# Patient Record
Sex: Female | Born: 1950
Health system: Southern US, Community
[De-identification: ages and names within clinical notes are randomized; demographics above are authoritative.]

## PROBLEM LIST (undated history)

## (undated) DIAGNOSIS — D509 Iron deficiency anemia, unspecified: Secondary | ICD-10-CM

## (undated) DIAGNOSIS — J449 Chronic obstructive pulmonary disease, unspecified: Secondary | ICD-10-CM

## (undated) DIAGNOSIS — M5090 Cervical disc disorder, unspecified, unspecified cervical region: Secondary | ICD-10-CM

## (undated) DIAGNOSIS — I10 Essential (primary) hypertension: Secondary | ICD-10-CM

## (undated) DIAGNOSIS — A630 Anogenital (venereal) warts: Secondary | ICD-10-CM

## (undated) DIAGNOSIS — E785 Hyperlipidemia, unspecified: Secondary | ICD-10-CM

## (undated) DIAGNOSIS — I219 Acute myocardial infarction, unspecified: Secondary | ICD-10-CM

## (undated) DIAGNOSIS — E039 Hypothyroidism, unspecified: Secondary | ICD-10-CM

## (undated) DIAGNOSIS — I2510 Atherosclerotic heart disease of native coronary artery without angina pectoris: Secondary | ICD-10-CM

## (undated) DIAGNOSIS — I5020 Unspecified systolic (congestive) heart failure: Secondary | ICD-10-CM

## (undated) DIAGNOSIS — O9989 Other specified diseases and conditions complicating pregnancy, childbirth and the puerperium: Secondary | ICD-10-CM

## (undated) DIAGNOSIS — E042 Nontoxic multinodular goiter: Secondary | ICD-10-CM

## (undated) DIAGNOSIS — J309 Allergic rhinitis, unspecified: Secondary | ICD-10-CM

## (undated) DIAGNOSIS — O99891 Other specified diseases and conditions complicating pregnancy: Secondary | ICD-10-CM

## (undated) DIAGNOSIS — M1990 Unspecified osteoarthritis, unspecified site: Secondary | ICD-10-CM

## (undated) HISTORY — DX: Unspecified osteoarthritis, unspecified site: M19.90

## (undated) HISTORY — DX: Anogenital (venereal) warts: A63.0

## (undated) HISTORY — DX: Atherosclerotic heart disease of native coronary artery without angina pectoris: I25.10

## (undated) HISTORY — DX: Hyperlipidemia, unspecified: E78.5

## (undated) HISTORY — DX: Essential (primary) hypertension: I10

## (undated) HISTORY — DX: Nontoxic multinodular goiter: E04.2

## (undated) HISTORY — DX: Chronic obstructive pulmonary disease, unspecified: J44.9

## (undated) HISTORY — DX: Unspecified systolic (congestive) heart failure: I50.20

## (undated) HISTORY — DX: Other specified diseases and conditions complicating pregnancy, childbirth and the puerperium: O99.89

## (undated) HISTORY — DX: Allergic rhinitis, unspecified: J30.9

## (undated) HISTORY — DX: Other specified diseases and conditions complicating pregnancy: O99.891

## (undated) HISTORY — DX: Hypothyroidism, unspecified: E03.9

## (undated) HISTORY — DX: Cervical disc disorder, unspecified, unspecified cervical region: M50.90

## (undated) HISTORY — DX: Iron deficiency anemia, unspecified: D50.9

## (undated) HISTORY — PX: CHOLECYSTECTOMY: SHX55

## (undated) HISTORY — DX: Acute myocardial infarction, unspecified: I21.9

---

## 1993-03-12 HISTORY — PX: CARDIAC CATHETERIZATION: SHX172

## 1998-06-26 ENCOUNTER — Encounter: Payer: Self-pay | Admitting: Emergency Medicine

## 1998-06-26 ENCOUNTER — Emergency Department (HOSPITAL_COMMUNITY): Admission: EM | Admit: 1998-06-26 | Discharge: 1998-06-26 | Payer: Self-pay | Admitting: Emergency Medicine

## 1998-08-13 ENCOUNTER — Ambulatory Visit (HOSPITAL_COMMUNITY): Admission: RE | Admit: 1998-08-13 | Discharge: 1998-08-13 | Payer: Self-pay | Admitting: Chiropractic Medicine

## 1998-08-13 ENCOUNTER — Encounter: Payer: Self-pay | Admitting: Chiropractic Medicine

## 1998-08-19 ENCOUNTER — Encounter: Payer: Self-pay | Admitting: Chiropractic Medicine

## 1998-08-19 ENCOUNTER — Ambulatory Visit (HOSPITAL_COMMUNITY): Admission: RE | Admit: 1998-08-19 | Discharge: 1998-08-19 | Payer: Self-pay | Admitting: Chiropractic Medicine

## 2008-01-15 ENCOUNTER — Emergency Department (HOSPITAL_COMMUNITY): Admission: EM | Admit: 2008-01-15 | Discharge: 2008-01-15 | Payer: Self-pay | Admitting: Emergency Medicine

## 2008-02-01 ENCOUNTER — Emergency Department (HOSPITAL_COMMUNITY): Admission: EM | Admit: 2008-02-01 | Discharge: 2008-02-01 | Payer: Self-pay | Admitting: Emergency Medicine

## 2008-03-17 ENCOUNTER — Ambulatory Visit: Payer: Self-pay | Admitting: Internal Medicine

## 2008-03-17 DIAGNOSIS — D509 Iron deficiency anemia, unspecified: Secondary | ICD-10-CM

## 2008-03-17 DIAGNOSIS — R946 Abnormal results of thyroid function studies: Secondary | ICD-10-CM | POA: Insufficient documentation

## 2008-03-17 DIAGNOSIS — I10 Essential (primary) hypertension: Secondary | ICD-10-CM | POA: Insufficient documentation

## 2008-03-17 DIAGNOSIS — J309 Allergic rhinitis, unspecified: Secondary | ICD-10-CM

## 2008-03-17 HISTORY — DX: Allergic rhinitis, unspecified: J30.9

## 2008-03-18 ENCOUNTER — Telehealth (INDEPENDENT_AMBULATORY_CARE_PROVIDER_SITE_OTHER): Payer: Self-pay | Admitting: *Deleted

## 2008-03-18 LAB — CONVERTED CEMR LAB
ALT: 12 units/L (ref 0–35)
AST: 19 units/L (ref 0–37)
Basophils Absolute: 0 10*3/uL (ref 0.0–0.1)
Bilirubin Urine: NEGATIVE
Bilirubin, Direct: 0.1 mg/dL (ref 0.0–0.3)
CO2: 33 meq/L — ABNORMAL HIGH (ref 19–32)
Calcium: 9.7 mg/dL (ref 8.4–10.5)
Eosinophils Relative: 5.1 % — ABNORMAL HIGH (ref 0.0–5.0)
GFR calc non Af Amer: 54 mL/min
Glucose, Bld: 102 mg/dL — ABNORMAL HIGH (ref 70–99)
HDL: 57.6 mg/dL (ref >39.0)
Ketones, ur: NEGATIVE mg/dL
LDL Cholesterol: 122 mg/dL — ABNORMAL HIGH (ref 0–99)
Leukocytes, UA: NEGATIVE
Lymphocytes Relative: 32.2 % (ref 12.0–46.0)
Monocytes Absolute: 0.2 10*3/uL (ref 0.1–1.0)
Monocytes Relative: 6 % (ref 3.0–12.0)
Neutrophils Relative %: 56.7 % (ref 43.0–77.0)
Platelets: 220 10*3/uL (ref 150–400)
Potassium: 3.9 meq/L (ref 3.5–5.1)
RDW: 13.5 % (ref 11.5–14.6)
Total Bilirubin: 0.7 mg/dL (ref 0.3–1.2)
Total CHOL/HDL Ratio: 3.3
Total Protein: 7.6 g/dL (ref 6.0–8.3)
Triglycerides: 44 mg/dL (ref 0–149)
Urobilinogen, UA: 0.2 (ref 0.0–1.0)
Vit D, 1,25-Dihydroxy: 17 — ABNORMAL LOW (ref 30–89)

## 2008-03-26 ENCOUNTER — Ambulatory Visit: Payer: Self-pay | Admitting: Internal Medicine

## 2008-03-26 LAB — CONVERTED CEMR LAB: TSH: 0.4 microintl units/mL (ref 0.35–5.50)

## 2008-04-29 ENCOUNTER — Ambulatory Visit: Payer: Self-pay | Admitting: Endocrinology

## 2008-04-29 DIAGNOSIS — R519 Headache, unspecified: Secondary | ICD-10-CM | POA: Insufficient documentation

## 2008-04-29 DIAGNOSIS — R51 Headache: Secondary | ICD-10-CM

## 2008-04-29 DIAGNOSIS — R634 Abnormal weight loss: Secondary | ICD-10-CM

## 2009-02-14 ENCOUNTER — Emergency Department (HOSPITAL_COMMUNITY): Admission: EM | Admit: 2009-02-14 | Discharge: 2009-02-14 | Payer: Self-pay | Admitting: Emergency Medicine

## 2009-03-02 ENCOUNTER — Ambulatory Visit: Payer: Self-pay | Admitting: Internal Medicine

## 2009-03-02 ENCOUNTER — Encounter (INDEPENDENT_AMBULATORY_CARE_PROVIDER_SITE_OTHER): Payer: Self-pay | Admitting: *Deleted

## 2009-03-02 DIAGNOSIS — R1013 Epigastric pain: Secondary | ICD-10-CM

## 2009-03-02 DIAGNOSIS — R0602 Shortness of breath: Secondary | ICD-10-CM

## 2009-03-02 DIAGNOSIS — R1319 Other dysphagia: Secondary | ICD-10-CM

## 2009-03-02 DIAGNOSIS — R079 Chest pain, unspecified: Secondary | ICD-10-CM

## 2009-03-02 DIAGNOSIS — M79609 Pain in unspecified limb: Secondary | ICD-10-CM | POA: Insufficient documentation

## 2009-03-03 ENCOUNTER — Telehealth: Payer: Self-pay | Admitting: Internal Medicine

## 2009-03-08 ENCOUNTER — Encounter (INDEPENDENT_AMBULATORY_CARE_PROVIDER_SITE_OTHER): Payer: Self-pay | Admitting: *Deleted

## 2009-03-09 ENCOUNTER — Ambulatory Visit (HOSPITAL_COMMUNITY): Admission: RE | Admit: 2009-03-09 | Discharge: 2009-03-09 | Payer: Self-pay | Admitting: Internal Medicine

## 2009-04-01 ENCOUNTER — Ambulatory Visit: Payer: Self-pay | Admitting: Internal Medicine

## 2009-04-21 ENCOUNTER — Emergency Department (HOSPITAL_COMMUNITY): Admission: EM | Admit: 2009-04-21 | Discharge: 2009-04-21 | Payer: Self-pay | Admitting: Emergency Medicine

## 2009-04-21 ENCOUNTER — Ambulatory Visit: Payer: Self-pay | Admitting: Internal Medicine

## 2009-04-21 DIAGNOSIS — R062 Wheezing: Secondary | ICD-10-CM

## 2009-04-27 ENCOUNTER — Ambulatory Visit: Payer: Self-pay | Admitting: Internal Medicine

## 2009-04-29 ENCOUNTER — Ambulatory Visit: Payer: Self-pay | Admitting: Internal Medicine

## 2009-05-10 ENCOUNTER — Ambulatory Visit: Payer: Self-pay | Admitting: Internal Medicine

## 2009-05-12 ENCOUNTER — Telehealth: Payer: Self-pay | Admitting: Internal Medicine

## 2009-10-12 ENCOUNTER — Encounter: Payer: Self-pay | Admitting: Internal Medicine

## 2009-11-15 ENCOUNTER — Ambulatory Visit: Payer: Self-pay | Admitting: Internal Medicine

## 2009-11-16 LAB — CONVERTED CEMR LAB
ALT: 15 units/L (ref 0–35)
Albumin: 3.9 g/dL (ref 3.5–5.2)
Alkaline Phosphatase: 101 units/L (ref 39–117)
BUN: 20 mg/dL (ref 6–23)
Basophils Absolute: 0 10*3/uL (ref 0.0–0.1)
Basophils Relative: 0.8 % (ref 0.0–3.0)
Bilirubin, Direct: 0.1 mg/dL (ref 0.0–0.3)
Calcium: 9.2 mg/dL (ref 8.4–10.5)
Creatinine, Ser: 1 mg/dL (ref 0.4–1.2)
Eosinophils Absolute: 0.1 10*3/uL (ref 0.0–0.7)
GFR calc non Af Amer: 76.89 mL/min (ref >60)
Glucose, Bld: 92 mg/dL (ref 70–99)
HCT: 37 % (ref 36.0–46.0)
HDL: 50.7 mg/dL (ref >39.00)
Hemoglobin: 12.1 g/dL (ref 12.0–15.0)
Ketones, ur: NEGATIVE mg/dL
Leukocytes, UA: NEGATIVE
Lymphocytes Relative: 40.3 % (ref 12.0–46.0)
MCHC: 32.8 g/dL (ref 30.0–36.0)
MCV: 88.6 fL (ref 78.0–100.0)
Nitrite: NEGATIVE
RBC: 4.18 M/uL (ref 3.87–5.11)
Sodium: 142 meq/L (ref 135–145)
Specific Gravity, Urine: 1.025 (ref 1.000–1.030)
TSH: 0.28 microintl units/mL — ABNORMAL LOW (ref 0.35–5.50)
Total CHOL/HDL Ratio: 3
Total Protein, Urine: NEGATIVE mg/dL
Total Protein: 7 g/dL (ref 6.0–8.3)
Triglycerides: 72 mg/dL (ref 0.0–149.0)
pH: 6.5 (ref 5.0–8.0)

## 2009-11-25 ENCOUNTER — Ambulatory Visit: Payer: Self-pay | Admitting: Endocrinology

## 2009-12-12 ENCOUNTER — Encounter: Admission: RE | Admit: 2009-12-12 | Discharge: 2009-12-12 | Payer: Self-pay | Admitting: Endocrinology

## 2009-12-12 ENCOUNTER — Encounter (HOSPITAL_COMMUNITY)
Admission: RE | Admit: 2009-12-12 | Discharge: 2010-03-12 | Payer: Self-pay | Source: Home / Self Care | Attending: Endocrinology | Admitting: Endocrinology

## 2009-12-23 ENCOUNTER — Ambulatory Visit: Payer: Self-pay | Admitting: Endocrinology

## 2009-12-23 DIAGNOSIS — E042 Nontoxic multinodular goiter: Secondary | ICD-10-CM

## 2009-12-23 HISTORY — DX: Nontoxic multinodular goiter: E04.2

## 2010-01-05 ENCOUNTER — Ambulatory Visit (HOSPITAL_COMMUNITY): Admission: RE | Admit: 2010-01-05 | Discharge: 2010-01-05 | Payer: Self-pay | Admitting: Endocrinology

## 2010-03-14 ENCOUNTER — Encounter: Payer: Self-pay | Admitting: Internal Medicine

## 2010-04-09 LAB — CONVERTED CEMR LAB
AST: 15 units/L (ref 0–37)
Alkaline Phosphatase: 86 units/L (ref 39–117)
BUN: 6 mg/dL (ref 6–23)
Bilirubin, Direct: 0 mg/dL (ref 0.0–0.3)
CO2: 30 meq/L (ref 19–32)
Calcium: 9.8 mg/dL (ref 8.4–10.5)
Chloride: 105 meq/L (ref 96–112)
Creatinine, Ser: 1.3 mg/dL — ABNORMAL HIGH (ref 0.4–1.2)
Eosinophils Absolute: 0.1 10*3/uL (ref 0.0–0.7)
HCT: 38.9 % (ref 36.0–46.0)
HDL: 44.2 mg/dL (ref >39.00)
Hemoglobin: 12.7 g/dL (ref 12.0–15.0)
Ketones, ur: NEGATIVE mg/dL
Lymphocytes Relative: 38.6 % (ref 12.0–46.0)
Monocytes Relative: 5.8 % (ref 3.0–12.0)
Platelets: 171 10*3/uL (ref 150.0–400.0)
Potassium: 4.2 meq/L (ref 3.5–5.1)
RDW: 13.3 % (ref 11.5–14.6)
Sed Rate: 29 mm/hr — ABNORMAL HIGH (ref 0–22)
Specific Gravity, Urine: 1.02 (ref 1.000–1.030)
TSH: 0.25 microintl units/mL — ABNORMAL LOW (ref 0.35–5.50)
Total Bilirubin: 0.7 mg/dL (ref 0.3–1.2)
Total CHOL/HDL Ratio: 4
Total Protein: 7.1 g/dL (ref 6.0–8.3)
Transferrin: 201.2 mg/dL — ABNORMAL LOW (ref 212.0–360.0)
Urine Glucose: NEGATIVE mg/dL
Urobilinogen, UA: 0.2 (ref 0.0–1.0)
Vitamin B-12: 430 pg/mL (ref 211–911)
pH: 7 (ref 5.0–8.0)

## 2010-04-13 NOTE — Letter (Signed)
Summary: Generic Letter  [facility]  [address] [place], [place] [address]   Phone: [phone]  Fax: [phone]    05/10/2009  Ivoree HOKE [address] [place], [place]  [address]  Dear Ms. Tocco,       This note is to certify that Ms Coulibaly is released from  acute medical care, and is able to return to work May 11, 2009  without restriction.      Sincerely,   Oliver Barre MD

## 2010-04-13 NOTE — Letter (Signed)
Summary: Primary Care Appointment Letter  [facility]  [address] [place], [place] [address]   Phone: [phone]  Fax: [phone]    10/12/2009 MRN: [id number]  [name] [address] [place], [place]  [address]  Dear Ms. Darcus Austin,   Your Primary Care Physician Corwin Levins MD has indicated that:    _______it is time to schedule an appointment.    _______you missed your appointment on______ and need to call and          reschedule.    _______you need to have lab work done.    _______you need to schedule an appointment discuss lab or test results.    ___X____you need to call to reschedule your appointment that is                       scheduled on November 11, 2009 with Dr. Jonny Ruiz.  Please call the office.     Please call our office as soon as possible. Our phone number is [phone]. Please press option 1. Our office is open 8a-12noon and 1p-5p, Monday through Friday.     Thank you,    [facility]

## 2010-04-13 NOTE — Assessment & Plan Note (Signed)
Summary: 6 MO ROV /NWS  #   Vital Signs:  Patient profile:   60 year old female Height:      65 inches Weight:      124 pounds BMI:     20.71 O2 Sat:      98 % on Room air Temp:     97.5 degrees F oral Pulse rate:   64 / minute BP sitting:   132 / 90  (left arm) Cuff size:   regular  Vitals Entered By: Zella Ball Ewing CMA (AAMA) (November 15, 2009 4:14 PM)  O2 Flow:  Room air  CC: 6 month ROV/RE   CC:  6 month ROV/RE.  History of Present Illness: here for f/u - wellness;  has some mild nasal allergy symptoms without pain, cough or fever;  Pt denies CP, worsening sob, doe, wheezing, orthopnea, pnd, worsening LE edema, palps, dizziness or syncope  Pt denies new neuro symptoms such as headache, facial or extremity weakness  denies polydipsia or polyuria.  Needs med refills.    Preventive Screening-Counseling & Management      Drug Use:  no.    Problems Prior to Update: 1)  Wheezing  (ICD-786.07) 2)  Hand Pain, Right  (ICD-729.5) 3)  Other Dysphagia  (ICD-787.29) 4)  Abdominal Pain, Epigastric  (ICD-789.06) 5)  Chest Pain  (ICD-786.50) 6)  Dyspnea  (ICD-786.05) 7)  Headache  (ICD-784.0) 8)  Weight Loss  (ICD-783.21) 9)  Thyroid Function Test, Abnormal  (ICD-794.5) 10)  Preventive Health Care  (ICD-V70.0) 11)  Neoplasm, Malignant, Colon, Family Hx, Sibling  (ICD-V16.0) 12)  Anemia-iron Deficiency  (ICD-280.9) 13)  Allergic Rhinitis  (ICD-477.9) 14)  Hypertension  (ICD-401.9)  Medications Prior to Update: 1)  Adult Aspirin Ec Low Strength 81 Mg Tbec (Aspirin) .Marland Kitchen.. 1po Once Daily 2)  Lisinopril 20 Mg Tabs (Lisinopril) .Marland Kitchen.. 1po Once Daily 3)  Vitamin D3 1000 Unit Tabs (Cholecalciferol) .... Take 1 By Mouth Qd  Current Medications (verified): 1)  Adult Aspirin Ec Low Strength 81 Mg Tbec (Aspirin) .Marland Kitchen.. 1po Once Daily 2)  Lisinopril 20 Mg Tabs (Lisinopril) .Marland Kitchen.. 1po Once Daily 3)  Vitamin D3 1000 Unit Tabs (Cholecalciferol) .... Take 1 By Mouth Qd  Allergies (verified): 1)   ! Pcn 2)  ! * Strawberries  Past History:  Past Medical History: Last updated: 03/17/2008 Hypertension Allergic rhinitis hx of genital warts Anemia-iron deficiency hx of blood transfusion after first child at 20yo DJD - hands c-spine disc disease with recurrent radicular pain - dr Montez Morita  Past Surgical History: Last updated: 03/17/2008 Cholecystectomy hx of heart cath 1995 with "tiny blockage" - [place] - dr Daisy Floro  Family History: Last updated: 04/29/2008 sister with colon cancer, pacemaker, HTN, DM, depression ETOH, drug abuse in several family parents with stroke, HTN, heart disease, DM grandfather with TIA mother with shingles sister has uncertain type of thyroid dz  Social History: Last updated: 11/15/2009 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         all otherwise negative per pt -    Physical Exam  General:  alert and underweight appearing.   Head:  normocephalic and atraumatic.   Eyes:  vision grossly intact, pupils equal, and pupils round.   Ears:  R ear normal and L ear normal.   Nose:   nasal dischargemucosal pallor and mucosal edema.   Mouth:  no gingival abnormalities and pharynx pink and moist.   Neck:  supple and no masses.   Lungs:  normal respiratory effort and normal breath sounds.   Heart:  normal rate and regular rhythm.   Abdomen:  soft, non-tender, and normal bowel sounds.   Msk:  no joint tenderness and no joint swelling.   Extremities:  no edema, no erythema  Neurologic:  cranial nerves II-XII intact and strength normal in all extremities.   Skin:  color normal and no rashes.   Psych:  not depressed appearing and slightly anxious.     Impression & Recommendations:  Problem # 1:  Preventive Health Care (ICD-V70.0)  Overall doing well, age appropriate education and counseling updated and referral for appropriate preventive services done unless declined, immunizations up to date or declined, diet counseling done if overweight, urged to quit smoking if smokes , most recent labs reviewed and current ordered if appropriate, ecg reviewed or declined (interpretation per ECG scanned in the EMR if done); information regarding Medicare Prevention requirements given if appropriate; speciality referrals updated as appropriate   Orders: Gastroenterology Referral (GI) TLB-BMP (Basic Metabolic Panel-BMET) (80048-METABOL) TLB-CBC Platelet - w/Differential (85025-CBCD) TLB-Hepatic/Liver Function Pnl (80076-HEPATIC) TLB-Lipid Panel (80061-LIPID) TLB-TSH (Thyroid Stimulating Hormone) (84443-TSH) TLB-Udip ONLY (81003-UDIP)  Problem # 2:  ALLERGIC RHINITIS (ICD-477.9) for OTC allegra as needed   Problem # 3:  HYPERTENSION (ICD-401.9)  Her updated medication list for this problem includes:    Lisinopril 20 Mg Tabs (Lisinopril) .Marland Kitchen... 1po once daily  BP today: 132/90 Prior BP: 140/80 (05/10/2009)  Labs Reviewed: K+: 4.2 (03/02/2009) Creat: : 1.3 (03/02/2009)   Chol: 157 (03/02/2009)   HDL: 44.20 (03/02/2009)   LDL: 95 (03/02/2009)   TG: 91.0 (03/02/2009) stable overall  by hx and exam, ok to continue meds/tx as is   Complete Medication List: 1)  Adult Aspirin Ec Low Strength 81 Mg Tbec (Aspirin) .Marland Kitchen.. 1po once daily 2)  Lisinopril 20 Mg Tabs (Lisinopril) .Marland Kitchen.. 1po once daily 3)  Vitamin D3 1000 Unit Tabs (Cholecalciferol) .... Take 1 by mouth qd  Other Orders: Tdap => 85yrs IM ([address]) Admin 1st Vaccine ([address])  Patient Instructions: 1)  You can try the OTC allegra as needed  2)  Please go to the Lab in the basement for your blood and/or urine tests today 3)  Please call the number on the [facility] Card for results of your testing  4)  You will be contacted about the referral(s) to: Colonoscopy 5)  you had the tetanus shot today 6)  Continue all previous medications as before this visit  7)  Please schedule a follow-up appointment in 1 year, or sooner if needed Prescriptions: LISINOPRIL 20 MG TABS (LISINOPRIL) 1po once daily  #90 x 3   Entered and Authorized by:   Corwin Levins MD   Signed by:   Corwin Levins MD on 11/15/2009   Method used:  Print then Give to Patient   RxID:   [id number]    Immunizations Administered:  Tetanus Vaccine:    Vaccine Type: Tdap    Site: left deltoid    Mfr: GlaxoSmithKline    Dose: 0.5 ml    Route: IM    Given by: Zella Ball Ewing CMA (AAMA)    Exp. Date: 12/30/2011    Lot #: [id number]    VIS given: 01/28/08 version given November 15, 2009.

## 2010-04-13 NOTE — Assessment & Plan Note (Signed)
Summary: FOLLOW UP FOR THYROID-LB   Vital Signs:  Patient profile:   60 year old female Height:      65 inches (165.10 cm) Weight:      125.50 pounds (57.05 kg) BMI:     20.96 O2 Sat:      97 % on Room air Temp:     98.6 degrees F (37.00 degrees C) oral Pulse rate:   77 / minute BP sitting:   136 / 76  (left arm) Cuff size:   regular  Vitals Entered By: Brenton Grills MA (December 23, 2009 4:26 PM)  O2 Flow:  Room air CC: Follow up on thyroid/aj Is Patient Diabetic? No   CC:  Follow up on thyroid/aj.  History of Present Illness: pt has h/o hyperthyroidism.  pt states she feels well in general, and neck pain is improved.  Current Medications (verified): 1)  Adult Aspirin Ec Low Strength 81 Mg Tbec (Aspirin) .Marland Kitchen.. 1po Once Daily 2)  Lisinopril 20 Mg Tabs (Lisinopril) .Marland Kitchen.. 1po Once Daily 3)  Vitamin D3 1000 Unit Tabs (Cholecalciferol) .... Take 1 By Mouth Qd 4)  Azithromycin 500 Mg Tabs (Azithromycin) .Marland Kitchen.. 1 Tab Once Daily 5)  Allegra Allergy 180 Mg Tabs (Fexofenadine Hcl) .Marland Kitchen.. 1 By Mouth Once Daily  Allergies (verified): 1)  ! Pcn 2)  ! * Strawberries  Past History:  Past Medical History: Last updated: 03/17/2008 Hypertension Allergic rhinitis hx of genital warts Anemia-iron deficiency hx of blood transfusion after first child at 20yo DJD - hands c-spine disc disease with recurrent radicular pain - dr Montez Morita  Family History: Reviewed history from 04/29/2008 and no changes required. sister with colon cancer, pacemaker, HTN, DM, depression ETOH, drug abuse in several family parents with stroke, HTN, heart disease, DM grandfather with TIA mother with shingles sister has uncertain type of thyroid dz  Review of Systems  The patient denies dyspnea on exertion.    Physical Exam  General:  normal appearance.   Neck:  i can note a 1-cm thyroid left nodule.   Additional Exam:  i reviewed results of ultrasound and nuc med scan with pt, and i gave her a copy of  each.     Impression & Recommendations:  Problem # 1:  GOITER, MULTINODULAR (ICD-241.1) Assessment Unchanged  Problem # 2:  THYROID FUNCTION TEST, ABNORMAL (ICD-794.5) due to #1  Medications Added to Medication List This Visit: 1)  Allegra Allergy 180 Mg Tabs (Fexofenadine hcl) .Marland Kitchen.. 1 by mouth once daily  Other Orders: Radiology Referral (Radiology) Est. Patient Level III ([address])  Patient Instructions: 1)  you will be given a radioactive iodine pill to shrink or destroy the thyroid lump, at the x ray department.  you will be called with a day and time for an appointment. 2)  please plan to return here approx 6 weeks after that.   Orders Added: 1)  Radiology Referral [Radiology] 2)  Est. Patient Level III [[address]]

## 2010-04-13 NOTE — Assessment & Plan Note (Signed)
Summary: FU---STC   Vital Signs:  Patient profile:   60 year old female Height:      65 inches Weight:      108.50 pounds BMI:     18.12 O2 Sat:      98 % on Room air Temp:     97.9 degrees F oral Pulse rate:   66 / minute BP sitting:   140 / 80  (left arm) Cuff size:   regular  Vitals Entered ByZella Ball Ewing (May 10, 2009 1:22 PM)  O2 Flow:  Room air CC: followup/RE   CC:  followup/RE.  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 - [employer]  Problems Prior to Update: 1)  Wheezing  (ICD-786.07) 2)  Hand Pain, Right  (ICD-729.5) 3)  Other Dysphagia  (ICD-787.29) 4)  Abdominal Pain, Epigastric  (ICD-789.06) 5)  Chest Pain  (ICD-786.50) 6)  Dyspnea  (ICD-786.05) 7)  Headache  (ICD-784.0) 8)  Weight Loss  (ICD-783.21) 9)  Thyroid Function Test, Abnormal  (ICD-794.5) 10)  Preventive Health Care  (ICD-V70.0) 11)  Neoplasm, Malignant, Colon, Family Hx, Sibling  (ICD-V16.0) 12)  Anemia-iron Deficiency  (ICD-280.9) 13)  Allergic Rhinitis  (ICD-477.9) 14)  Hypertension  (ICD-401.9)  Medications Prior to Update: 1)  Adult Aspirin Ec Low Strength 81 Mg Tbec (Aspirin) .Marland Kitchen.. 1po Once Daily 2)  Lisinopril 10 Mg Tabs (Lisinopril) .Marland Kitchen.. 1po Once Daily 3)  Vitamin D3 1000 Unit Tabs (Cholecalciferol) .... Take 1 By Mouth Qd  Current Medications (verified): 1)  Adult Aspirin Ec Low Strength 81 Mg Tbec (Aspirin) .Marland Kitchen.. 1po Once Daily 2)  Lisinopril 20 Mg Tabs (Lisinopril) .Marland Kitchen.. 1po Once Daily 3)  Vitamin D3 1000 Unit Tabs (Cholecalciferol) .... Take 1 By Mouth Qd  Allergies (verified): 1)  ! Pcn 2)  ! 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   Head:  normocephalic and atraumatic.   Eyes:  vision grossly intact, pupils equal, and pupils round.   Ears:  R ear normal and L ear normal.   Nose:  no external deformity and no nasal discharge.   Mouth:  no gingival abnormalities and pharynx pink and moist.   Neck:  supple and no masses.   Lungs:  normal respiratory effort and normal breath sounds.   Heart:  normal rate and regular rhythm.   Extremities:  no edema, no erythema  Impression & Recommendations:  Problem # 1:  WHEEZING (ICD-786.07) assoc with bronchitis recent - resolved, ok for note to back to work  Problem # 2:  HYPERTENSION (ICD-401.9)  Her updated medication list for this problem includes:    Lisinopril 20 Mg Tabs (Lisinopril) .Marland Kitchen... 1po once daily stable overall by hx and exam, ok to continue meds/tx as is   Complete Medication List: 1)  Adult Aspirin Ec Low Strength 81 Mg Tbec (Aspirin) .Marland Kitchen.. 1po once daily 2)  Lisinopril 20 Mg Tabs (Lisinopril) .Marland Kitchen.. 1po once daily 3)  Vitamin D3 1000 Unit Tabs (Cholecalciferol) .... Take 1 by mouth qd  Patient Instructions: 1)  Continue all previous medications as before this visit  2)  you are given the work  release note today 3)  Please schedule a follow-up appointment in 6 months.

## 2010-04-13 NOTE — Progress Notes (Signed)
Summary: FMLA  Phone Note Other Incoming   Summary of Call: Called to discuss FMLA papers, left message on voicemail to call back to office. Initial call taken by: Lucious Groves,  May 12, 2009 3:11 PM  Follow-up for Phone Call        Left message on voicemail to call back to office. Follow-up by: Lucious Groves,  May 17, 2009 11:36 AM  Additional Follow-up for Phone Call Additional follow up Details #1::        no return call from patient, left message on voicemail to call back to office. Additional Follow-up by: Lucious Groves,  May 19, 2009 2:52 PM    Additional Follow-up for Phone Call Additional follow up Details #2::    no return call from patient, closed note until patient calls. Follow-up by: Lucious Groves,  May 23, 2009 4:05 PM

## 2010-04-13 NOTE — Letter (Signed)
Summary: Referral - not able to see patient  [facility]  [address] [place], [place] [address]   Phone: [phone]  Fax: [phone]    March 14, 2010   Oliver Barre, MD [address] [place], [place] [address]   Re:   [name] DOB:  [birth year] MRN:   [id number]    Dear Dr. Jonny Ruiz:  Thank you for your kind referral of the above patient.  We have attempted to schedule the recommended procedure Screening Colonoscopy but have not been able to schedule because:  X   The patient was not available by phone and/or has not returned our calls.  ___ The patient declined to schedule the procedure at this time.  We appreciate the referral and hope that we will have the opportunity to treat this patient in the future.    Sincerely,    [facility] [phone]

## 2010-04-13 NOTE — Assessment & Plan Note (Signed)
Summary: NEW ENDO CONSULT/ THYROID/ NWS   Vital Signs:  Patient profile:   60 year old female Height:      65 inches (165.10 cm) Weight:      124.38 pounds (56.54 kg) BMI:     20.77 O2 Sat:      97 % on Room air Temp:     98.5 degrees F (36.94 degrees C) oral Pulse rate:   76 / minute BP sitting:   130 / 82  (left arm) Cuff size:   regular  Vitals Entered By: Brenton Grills MA (November 25, 2009 3:39 PM)  O2 Flow:  Room air CC: New Endo/Thyroid/Dr. John/aj   CC:  New Endo/Thyroid/Dr. John/aj.  History of Present Illness: pt has been intermittently noted to have a suppressed tsh since 2009.  i saw her in early 2010.  she declined ultrasound then.  pt states she feels well in general, except for few days of a slight pain at the throat (right > left), and assoc swelling of the neck.     Current Medications (verified): 1)  Adult Aspirin Ec Low Strength 81 Mg Tbec (Aspirin) .Marland Kitchen.. 1po Once Daily 2)  Lisinopril 20 Mg Tabs (Lisinopril) .Marland Kitchen.. 1po Once Daily 3)  Vitamin D3 1000 Unit Tabs (Cholecalciferol) .... Take 1 By Mouth Qd  Allergies (verified): 1)  ! Pcn 2)  ! * Strawberries  Past History:  Past Medical History: Last updated: 03/17/2008 Hypertension Allergic rhinitis hx of genital warts Anemia-iron deficiency hx of blood transfusion after first child at 20yo DJD - hands c-spine disc disease with recurrent radicular pain - dr Montez Morita  Social History: Reviewed history from 11/15/2009 and no changes required. 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  The patient denies fever and dyspnea on exertion.            Physical Exam  General:  normal appearance.   Head:  head: no deformity eyes: no periorbital swelling, no proptosis external nose and ears are normal mouth: no  lesion seen Neck:  thyroid has slightly irreg texture, and ? of 1-cm left nodule.   Additional Exam:  FastTSH              [L]  0.28 uIU/mL     i also reviewed cxr reports.  none make incidental note of a goiter.   Impression & Recommendations:  Problem # 1:  THYROID FUNCTION TEST, ABNORMAL (ICD-794.5) usually due to multinodular goiter  Problem # 2:  uri new  Medications Added to Medication List This Visit: 1)  Azithromycin 500 Mg Tabs (Azithromycin) .Marland Kitchen.. 1 tab once daily  Other Orders: Radiology Referral (Radiology) Radiology Referral (Radiology) Consultation Level IV [phone]) Consultation Level III [phone])  Patient Instructions: 1)  azithromycin 500 mg once daily 2)  let's check a thyroid ultrasound, and "scan" ( a special but easy type of thyroid x ray).. you will be called with a day and time for an appointment, for each of these 2 tests. 3)  when you know when these tests are scheduled for, please plan to return here 1-2 days after the 2nd test.   Prescriptions: AZITHROMYCIN 500 MG TABS (AZITHROMYCIN) 1 tab once daily  #6 x 0   Entered and Authorized by:   Minus Breeding MD  Signed by:   Minus Breeding MD on 11/25/2009   Method used:   Electronically to        [facility] [phone]* (retail)       [address]       [place], [place]  [address]       Ph: [phone]       Fax: [phone]   RxID:   [id number]

## 2010-04-13 NOTE — Assessment & Plan Note (Signed)
Summary: BREATHING PROBEMS/ WILL GO TO ER IF WORSE/ REFUSED APPT TODAY...   Vital Signs:  Patient profile:   60 year old female Height:      66 inches Weight:      116.25 pounds BMI:     18.83 O2 Sat:      98 % on Room air Temp:     98.2 degrees F oral Pulse rate:   68 / minute BP sitting:   112 / 76  (left arm) Cuff size:   large  O2 Flow:  Room air CC: Pt c/o SOB, right shoulder/arm pain, headache, niight sweats and sore throat/re   CC:  Pt c/o SOB, right shoulder/arm pain, headache, and niight sweats and sore throat/re.  History of Present Illness: here for eval for dyspnea/sob and cp, trying to avoid the ER;  states she has had 8 days of  gradually worsening symptoms, seemed to start with URI symtpoms (and still "I cant breathe out of my left nose" due to congestion), increased ST, neck pain with painful/tender lumps mostly on the right side; and gradually increased sob/doe with mild prod cough (not sure of color of sputum it seems), wheezing, chest tightness to the point that she has to stop to catch her breath just stanidng up from the chair;  can only walk to room to room, breathless to give the hx;  also has ongoing fairly constant SSCP that does seem pleuritic, but worse in the last 24 hrs, and ECG today with sinus with worsening LVH strain pattern, not likely st depression and no t wave inversions.  Given alb neb and depomedrol 120 IM x 1 in the office, now referred to hosp for admit for likely acute bronchitis, copd exacerbation, and CP (should likely be ruled out for MI, tele).    Problems Prior to Update: 1)  Hand Pain, Right  (ICD-729.5) 2)  Other Dysphagia  (ICD-787.29) 3)  Abdominal Pain, Epigastric  (ICD-789.06) 4)  Chest Pain  (ICD-786.50) 5)  Dyspnea  (ICD-786.05) 6)  Headache  (ICD-784.0) 7)  Weight Loss  (ICD-783.21) 8)  Thyroid Function Test, Abnormal  (ICD-794.5) 9)  Preventive Health Care  (ICD-V70.0) 10)  Neoplasm, Malignant, Colon, Family Hx, Sibling   (ICD-V16.0) 11)  Anemia-iron Deficiency  (ICD-280.9) 12)  Allergic Rhinitis  (ICD-477.9) 13)  Hypertension  (ICD-401.9)  Medications Prior to Update: 1)  Adult Aspirin Ec Low Strength 81 Mg Tbec (Aspirin) .Marland Kitchen.. 1po Once Daily 2)  Lisinopril 10 Mg Tabs (Lisinopril) .Marland Kitchen.. 1po Once Daily 3)  Vitamin D3 1000 Unit Tabs (Cholecalciferol) .... Take 1 By Mouth Qd 4)  Levaquin 500 Mg Tabs (Levofloxacin) .Marland Kitchen.. 1po Once Daily  Current Medications (verified): 1)  Adult Aspirin Ec Low Strength 81 Mg Tbec (Aspirin) .Marland Kitchen.. 1po Once Daily 2)  Lisinopril 10 Mg Tabs (Lisinopril) .Marland Kitchen.. 1po Once Daily 3)  Vitamin D3 1000 Unit Tabs (Cholecalciferol) .... Take 1 By Mouth Qd 4)  Levaquin 500 Mg Tabs (Levofloxacin) .Marland Kitchen.. 1po Once Daily  Allergies (verified): 1)  ! Pcn 2)  ! * Strawberries  Past History:  Past Medical History: Last updated: 03/17/2008 Hypertension Allergic rhinitis hx of genital warts Anemia-iron deficiency hx of blood transfusion after first child at 20yo DJD - hands c-spine disc disease with recurrent radicular pain - dr Montez Morita  Past Surgical History: Last updated: 03/17/2008 Cholecystectomy hx of heart cath 1995 with "tiny blockage" - [place] - dr Daisy Floro  Family History: Last updated: 04/29/2008 sister with colon cancer, pacemaker, HTN, DM, depression  ETOH, drug abuse in several family parents with stroke, HTN, heart disease, DM grandfather with TIA mother with shingles sister has uncertain type of thyroid dz  Social History: Last updated: 03/02/2009 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   Head:   normocephalic and atraumatic.   Eyes:  vision grossly intact, pupils equal, and pupils round.   Ears:  tm's mild erythema bilat, sinus nontender Nose:  nasal dischargemucosal pallor and mucosal erythema.   Mouth:  pharyngeal erythema and poor dentition.   Neck:  supple.  but with tender bilat LA, worse and more tender on the right submandibular and pre-scm areas Lungs:  increased RR/WOB, talks in short sentences, no retractions; has decreased BS bilat with few wheeze bilat, no rales Heart:  normal rate and regular rhythm.   Abdomen:  soft, non-tender, and normal bowel sounds.   Msk:  no joint tenderness and no joint swelling.   Extremities:  no edema, no erythema  Neurologic:  alert & oriented X3 and cranial nerves II-XII intact.     Impression & Recommendations:  Problem # 1:  BRONCHITIS-ACUTE (ICD-466.0)  The following medications were removed from the medication list:    Levaquin 500 Mg Tabs (Levofloxacin) .Marland Kitchen... 1po once daily mild to mod, but will need antibx tx; see below  Problem # 2:  WHEEZING (ICD-786.07) marked, likely due to bronchospasm due to above - gave neb and steroid shot IM today, will need admit for further eval and tx, O2 and nebs  Problem # 3:  CHEST PAIN (ICD-786.50) ECG today reviewed with worsening LVH /straiin pattern, and CP atypical, should be on tele and r/o for MI  Problem # 4:  HYPERTENSION (ICD-401.9)  Her updated medication list for this problem includes:    Lisinopril 10 Mg Tabs (Lisinopril) .Marland Kitchen... 1po once daily  BP today: 112/76 Prior BP: 140/86 (03/02/2009)  Labs Reviewed: K+: 4.2 (03/02/2009) Creat: : 1.3 (03/02/2009)   Chol: 157 (03/02/2009)   HDL: 44.20 (03/02/2009)   LDL: 95 (03/02/2009)   TG: 91.0 (03/02/2009) stable overall by hx and exam, ok to continue meds/tx as is   Complete Medication List: 1)  Adult Aspirin Ec Low Strength 81 Mg Tbec (Aspirin) .Marland Kitchen.. 1po once daily 2)  Lisinopril 10 Mg Tabs (Lisinopril) .Marland Kitchen.. 1po once daily 3)   Vitamin D3 1000 Unit Tabs (Cholecalciferol) .... Take 1 by mouth qd  Patient Instructions: 1)  you were given the nebulizer treatment and ad steroid shot today in the office. 2)  Please take this note with you for admission  Appended Document: Immunization Entry      Medication Administration  Injection # 1:    Medication: Depo- Medrol 40mg     Diagnosis: WHEEZING (ICD-786.07)    Route: IM    Site: RUOQ gluteus    Exp Date: 12/2009    Lot #: [id number]    Mfr: Teva    Given by: Zella Ball Ewing (April 21, 2009 11:58 AM)  Injection # 2:    Medication: Depo- Medrol 80mg     Diagnosis: WHEEZING (ICD-786.07)    Route: IM    Site: RUOQ gluteus  Exp Date: 12/2009    Lot #: [id number]    Mfr: TEva    Given by: Zella Ball Ewing (April 21, 2009 11:58 AM)  Orders Added: 1)  Depo- Medrol 40mg  [J1030] 2)  Depo- Medrol 80mg  [J1040] 3)  Admin of Therapeutic Inj  intramuscular or subcutaneous [96372]  Appended Document: BREATHING PROBEMS/ WILL GO TO ER IF WORSE/ REFUSED APPT TODAY...    Clinical Lists Changes  Orders: Added new Service order of Nebulizer Tx (78295) - Signed Added new Service order of EKG w/ Interpretation (93000) - Signed

## 2010-04-13 NOTE — Progress Notes (Signed)
°  Appt 04/13/08 @ 8:15 am w/Dr Everardo All   ---- Converted from flag ---- ---- 03/18/2008 9:22 AM, Dagoberto Reef wrote: Please schedule with Dr Everardo All,  Thanks  ---- 03/17/2008 5:57 PM, Corwin Levins MD wrote: The following orders have been entered for this patient and placed on Admin Hold:  Type:     Referral       Code:   Endocrine Description:   Endocrinology Referral Order Date:   03/17/2008   Authorized By:   Corwin Levins MD Order #:   [id number] Clinical Notes:   dr Everardo All ------------------------------

## 2010-04-13 NOTE — Miscellaneous (Signed)
Summary: Orders Update   Clinical Lists Changes  Orders: Added new Referral order of Endocrinology Referral (Endocrine) - Signed 

## 2010-04-24 ENCOUNTER — Ambulatory Visit (INDEPENDENT_AMBULATORY_CARE_PROVIDER_SITE_OTHER)
Admission: RE | Admit: 2010-04-24 | Discharge: 2010-04-24 | Disposition: A | Discharge status: Home / Self Care | Payer: BC Managed Care – PPO | Source: Ambulatory Visit | Attending: Internal Medicine | Admitting: Internal Medicine

## 2010-04-24 ENCOUNTER — Telehealth: Payer: Self-pay | Admitting: Internal Medicine

## 2010-04-24 ENCOUNTER — Other Ambulatory Visit: Payer: Self-pay | Admitting: Internal Medicine

## 2010-04-24 ENCOUNTER — Ambulatory Visit (INDEPENDENT_AMBULATORY_CARE_PROVIDER_SITE_OTHER): Payer: BC Managed Care – PPO | Admitting: Internal Medicine

## 2010-04-24 ENCOUNTER — Encounter (INDEPENDENT_AMBULATORY_CARE_PROVIDER_SITE_OTHER): Payer: Self-pay | Admitting: *Deleted

## 2010-04-24 ENCOUNTER — Encounter: Payer: Self-pay | Admitting: Internal Medicine

## 2010-04-24 DIAGNOSIS — J209 Acute bronchitis, unspecified: Secondary | ICD-10-CM

## 2010-04-24 DIAGNOSIS — R634 Abnormal weight loss: Secondary | ICD-10-CM

## 2010-04-24 DIAGNOSIS — I10 Essential (primary) hypertension: Secondary | ICD-10-CM

## 2010-04-26 ENCOUNTER — Telehealth: Payer: Self-pay | Admitting: Internal Medicine

## 2010-05-03 NOTE — Progress Notes (Signed)
Summary: work  Advice worker from Patient   Caller: Patient Call For: Corwin Levins MD Summary of Call: Patient called left msg. on triage for a call back. call back number [phone] Initial call taken by: Robin Ewing CMA Duncan Dull),  April 26, 2010 11:35 AM  Follow-up for Phone Call        Called patient and she listened to her results on phone tree. Since she does not have pneumonia when should she go back to work? Follow-up by: Zella Ball Ewing CMA Duncan Dull),  April 26, 2010 11:37 AM  Additional Follow-up for Phone Call Additional follow up Details #1::        should be back today, since her note did not cover today Additional Follow-up by: Corwin Levins MD,  April 26, 2010 12:06 PM    Additional Follow-up for Phone Call Additional follow up Details #2::    called pt. informed of above information, Follow-up by: Robin Ewing CMA Duncan Dull),  April 26, 2010 12:53 PM

## 2010-05-03 NOTE — Assessment & Plan Note (Signed)
Summary: ACHES/ COUGH/ REFUSED FRIDAY WITH ANOTHER PROVIDER/NWS   Vital Signs:  Patient profile:   60 year old female Height:      65 inches Weight:      130.13 pounds BMI:     21.73 O2 Sat:      96 % on Room air Temp:     98.4 degrees F oral Pulse rate:   67 / minute BP sitting:   132 / 80  (left arm) Cuff size:   regular  Vitals Entered By: Zella Ball Ewing CMA (AAMA) (April 24, 2010 11:15 AM)  O2 Flow:  Room air CC: Cough, congestion, right ear pain and sore throat/RE   CC:  Cough, congestion, and right ear pain and sore throat/RE.  History of Present Illness: here to f/u with acute onset progressively worse 3 wks mild now moderate fever, headache, general weakness and malaise, ST, and increased prod cough greenish/brownish sputum, with minor sob at best though;  Pt denies CP, doe, wheezing, orthopnea, pnd, worsening LE edema, palps, dizziness or syncope  .  Pt denies new neuro symptoms such as headache, facial or extremity weakness  Pt denies polydipsia, polyuria, but just feels terrible overall.  Right earacahe worse in the past 2 days as well, though no hearing loss, vertigo, n/v.   No recent wt loss, night sweats, loss of appetite or other constitutional symptoms , in fact has gained 5 lbs with recent eval and tx.  Overall good compliance with meds, and good tolerability.  Denies worsening depressive symptoms, suicidal ideation, or panic.   Problems Prior to Update: 1)  Bronchitis-acute  (ICD-466.0) 2)  Goiter, Multinodular  (ICD-241.1) 3)  Wheezing  (ICD-786.07) 4)  Hand Pain, Right  (ICD-729.5) 5)  Other Dysphagia  (ICD-787.29) 6)  Abdominal Pain, Epigastric  (ICD-789.06) 7)  Chest Pain  (ICD-786.50) 8)  Dyspnea  (ICD-786.05) 9)  Headache  (ICD-784.0) 10)  Weight Loss  (ICD-783.21) 11)  Thyroid Function Test, Abnormal  (ICD-794.5) 12)  Preventive Health Care  (ICD-V70.0) 13)  Neoplasm, Malignant, Colon, Family Hx, Sibling  (ICD-V16.0) 14)  Anemia-iron Deficiency   (ICD-280.9) 15)  Allergic Rhinitis  (ICD-477.9) 16)  Hypertension  (ICD-401.9)  Medications Prior to Update: 1)  Adult Aspirin Ec Low Strength 81 Mg Tbec (Aspirin) .Marland Kitchen.. 1po Once Daily 2)  Lisinopril 20 Mg Tabs (Lisinopril) .Marland Kitchen.. 1po Once Daily 3)  Vitamin D3 1000 Unit Tabs (Cholecalciferol) .... Take 1 By Mouth Qd 4)  Allegra Allergy 180 Mg Tabs (Fexofenadine Hcl) .Marland Kitchen.. 1 By Mouth Once Daily  Current Medications (verified): 1)  Adult Aspirin Ec Low Strength 81 Mg Tbec (Aspirin) .Marland Kitchen.. 1po Once Daily 2)  Lisinopril 20 Mg Tabs (Lisinopril) .Marland Kitchen.. 1po Once Daily 3)  Vitamin D3 1000 Unit Tabs (Cholecalciferol) .... Take 1 By Mouth Qd 4)  Allegra Allergy 180 Mg Tabs (Fexofenadine Hcl) .Marland Kitchen.. 1 By Mouth Once Daily 5)  Levofloxacin 500 Mg Tabs (Levofloxacin) .Marland Kitchen.. 1po Once Daily 6)  Hydrocodone-Homatropine 5-1.5 Mg/37ml Syrp (Hydrocodone-Homatropine) .Marland Kitchen.. 1 Tsp By Mouth Q 6 Hrs As Needed Cough  Allergies (verified): 1)  ! Pcn 2)  ! 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 , mild ill  Head:  normocephalic and atraumatic.   Eyes:  vision grossly intact, pupils equal, and pupils round.   Ears:  right tm marked erythema, left TM mild only,  canals  clear Nose:  nasal dischargemucosal pallor and mucosal edema.   Mouth:  pharyngeal erythema and fair dentition.   Neck:  supple and cervical lymphadenopathy.   Lungs:  normal respiratory effort, normal breath sounds, and R base crackles.   Heart:  normal rate and regular rhythm.   Abdomen:  soft, non-tender, and normal bowel sounds.   Extremities:  no edema, no erythema  Psych:  not depressed appearing and slightly anxious.     Impression & Recommendations:  Problem # 1:  BRONCHITIS-ACUTE (ICD-466.0)  Orders: T-2 View CXR, Same Day (71020.5TC)  Her updated medication list for this problem includes:    Levofloxacin 500 Mg Tabs (Levofloxacin) .Marland Kitchen... 1po once daily    Hydrocodone-homatropine 5-1.5 Mg/53ml Syrp (Hydrocodone-homatropine) .Marland Kitchen... 1 tsp by mouth q 6 hrs as needed cough recent bronchitis type symtpoms , though cant r/o pna with few RLL rales - treat as above, f/u any worsening signs or symptoms , also for cxr today  Problem # 2:  HYPERTENSION (ICD-401.9)  Her updated medication list for this problem includes:    Lisinopril 20 Mg Tabs (Lisinopril) .Marland Kitchen... 1po once daily  BP today: 132/80 Prior BP: 136/76 (12/23/2009)  Labs Reviewed: K+: 4.3 (11/15/2009) Creat: : 1.0 (11/15/2009)   Chol: 151 (11/15/2009)   HDL: 50.70 (11/15/2009)   LDL: 86 (11/15/2009)   TG: 72.0 (11/15/2009) stable overall by hx and exam, ok to continue meds/tx as is   Problem # 3:  WEIGHT LOSS (ICD-783.21) improved recent by 5 lbs;  ok to follow - pt enouraged to cont taking by mouth better  Complete Medication List: 1)  Adult Aspirin Ec Low Strength 81 Mg Tbec (Aspirin) .Marland Kitchen.. 1po once daily 2)  Lisinopril 20 Mg Tabs (Lisinopril) .Marland Kitchen.. 1po once daily 3)  Vitamin D3 1000 Unit Tabs (Cholecalciferol) .... Take 1 by mouth qd 4)  Allegra Allergy 180 Mg Tabs (Fexofenadine hcl) .Marland Kitchen.. 1 by mouth once daily 5)  Levofloxacin 500 Mg Tabs (Levofloxacin) .Marland Kitchen.. 1po once daily 6)  Hydrocodone-homatropine 5-1.5 Mg/44ml  Syrp (Hydrocodone-homatropine) .Marland Kitchen.. 1 tsp by mouth q 6 hrs as needed cough  Patient Instructions: 1)  Please take all new medications as prescribed 2)  Continue all previous medications as before this visit  3)  You can also use Mucinex OTC or it's generic for congestion  4)  Please go to Radiology in the basement level for your X-Ray today  5)  Please call the number on the [facility] Card for results of your testing  6)  Please schedule a follow-up appointment in Sept 2012 for CPX with labs, or sooner if needed Prescriptions: HYDROCODONE-HOMATROPINE 5-1.5 MG/5ML SYRP (HYDROCODONE-HOMATROPINE) 1 tsp by mouth q 6 hrs as needed cough  #6oz x 1   Entered and Authorized by:   Corwin Levins MD   Signed by:   Corwin Levins MD on 04/24/2010   Method used:   Print then Give to Patient  RxID:   [id number] LEVOFLOXACIN 500 MG TABS (LEVOFLOXACIN) 1po once daily  #10 x 0   Entered and Authorized by:   Corwin Levins MD   Signed by:   Corwin Levins MD on 04/24/2010   Method used:   Print then Give to Patient   RxID:   [id number]    Orders Added: 1)  T-2 View CXR, Same Day [71020.5TC] 2)  Est. Patient Level IV [47425]

## 2010-05-03 NOTE — Progress Notes (Signed)
----   Converted from flag ---- ---- 04/24/2010 1:14 PM, Corwin Levins MD wrote: pt needs work note for today and tomorrow,  then call her I guess and we can fax to her employer ------------------------------  called pt left msg. to call back   called pt. left  msg. that work note mailed to her home address

## 2010-05-03 NOTE — Letter (Signed)
Summary: Out of Work  [employer] [employer]  [address] [place], [place] [address]   Phone: [phone]  Fax: [phone]    April 24, 2010   Employee:  HARUMI YAMIN    To Whom It May Concern:   For Medical reasons, please excuse the above named employee from work for the following dates:  Start:   04/24/2010  End:   04/25/2010  If you need additional information, please feel free to contact our office.         Sincerely,    Dr. Oliver Barre

## 2010-06-01 ENCOUNTER — Ambulatory Visit: Payer: BC Managed Care – PPO | Admitting: Internal Medicine

## 2010-06-01 DIAGNOSIS — Z0289 Encounter for other administrative examinations: Secondary | ICD-10-CM

## 2010-06-01 LAB — URINALYSIS, ROUTINE W REFLEX MICROSCOPIC
Bilirubin Urine: NEGATIVE
Ketones, ur: NEGATIVE mg/dL
Nitrite: NEGATIVE
Urobilinogen, UA: 0.2 mg/dL (ref 0.0–1.0)

## 2010-06-01 LAB — POCT I-STAT, CHEM 8
Calcium, Ion: 1.18 mmol/L (ref 1.12–1.32)
HCT: 41 % (ref 36.0–46.0)
Hemoglobin: 13.9 g/dL (ref 12.0–15.0)
Sodium: 140 mEq/L (ref 135–145)

## 2010-06-01 LAB — CBC
Hemoglobin: 13 g/dL (ref 12.0–15.0)
RBC: 4.49 MIL/uL (ref 3.87–5.11)
RDW: 14.8 % (ref 11.5–15.5)

## 2010-06-01 LAB — DIFFERENTIAL
Eosinophils Absolute: 0.1 10*3/uL (ref 0.0–0.7)
Eosinophils Relative: 1 % (ref 0–5)
Lymphs Abs: 1.5 10*3/uL (ref 0.7–4.0)
Monocytes Relative: 5 % (ref 3–12)

## 2010-06-01 LAB — POCT CARDIAC MARKERS
Myoglobin, poc: 57.4 ng/mL (ref 12–200)
Troponin i, poc: <0.05 ng/mL (ref 0.00–0.09)
Troponin i, poc: <0.05 ng/mL (ref 0.00–0.09)

## 2010-06-05 ENCOUNTER — Inpatient Hospital Stay (HOSPITAL_COMMUNITY)
Admission: EM | Admit: 2010-06-05 | Discharge: 2010-06-09 | DRG: 127 | Disposition: A | Discharge status: Home / Self Care | Payer: BC Managed Care – PPO | Attending: Cardiology | Admitting: Cardiology

## 2010-06-05 ENCOUNTER — Emergency Department (HOSPITAL_COMMUNITY): Payer: BC Managed Care – PPO

## 2010-06-05 DIAGNOSIS — I509 Heart failure, unspecified: Secondary | ICD-10-CM | POA: Diagnosis present

## 2010-06-05 DIAGNOSIS — M19049 Primary osteoarthritis, unspecified hand: Secondary | ICD-10-CM | POA: Diagnosis present

## 2010-06-05 DIAGNOSIS — R079 Chest pain, unspecified: Secondary | ICD-10-CM

## 2010-06-05 DIAGNOSIS — I5021 Acute systolic (congestive) heart failure: Principal | ICD-10-CM | POA: Diagnosis present

## 2010-06-05 DIAGNOSIS — J209 Acute bronchitis, unspecified: Secondary | ICD-10-CM | POA: Diagnosis present

## 2010-06-05 DIAGNOSIS — I2510 Atherosclerotic heart disease of native coronary artery without angina pectoris: Secondary | ICD-10-CM | POA: Diagnosis present

## 2010-06-05 DIAGNOSIS — I447 Left bundle-branch block, unspecified: Secondary | ICD-10-CM | POA: Diagnosis present

## 2010-06-05 DIAGNOSIS — E039 Hypothyroidism, unspecified: Secondary | ICD-10-CM | POA: Diagnosis present

## 2010-06-05 DIAGNOSIS — I319 Disease of pericardium, unspecified: Secondary | ICD-10-CM

## 2010-06-05 DIAGNOSIS — I10 Essential (primary) hypertension: Secondary | ICD-10-CM | POA: Diagnosis present

## 2010-06-05 DIAGNOSIS — J440 Chronic obstructive pulmonary disease with acute lower respiratory infection: Secondary | ICD-10-CM | POA: Diagnosis present

## 2010-06-05 DIAGNOSIS — R0602 Shortness of breath: Secondary | ICD-10-CM

## 2010-06-05 DIAGNOSIS — E0520 Thyrotoxicosis with toxic multinodular goiter without thyrotoxic crisis or storm: Secondary | ICD-10-CM | POA: Diagnosis present

## 2010-06-05 LAB — CBC
MCV: 86 fL (ref 78.0–100.0)
RDW: 16.6 % — ABNORMAL HIGH (ref 11.5–15.5)

## 2010-06-05 LAB — COMPREHENSIVE METABOLIC PANEL
Albumin: 3.9 g/dL (ref 3.5–5.2)
Alkaline Phosphatase: 111 U/L (ref 39–117)
BUN: 18 mg/dL (ref 6–23)
Calcium: 9.2 mg/dL (ref 8.4–10.5)
Potassium: 3.8 mEq/L (ref 3.5–5.1)
Total Bilirubin: 0.7 mg/dL (ref 0.3–1.2)
Total Protein: 7.3 g/dL (ref 6.0–8.3)

## 2010-06-05 LAB — DIFFERENTIAL
Basophils Relative: 0 % (ref 0–1)
Eosinophils Absolute: 0.1 10*3/uL (ref 0.0–0.7)
Monocytes Absolute: 0.1 10*3/uL (ref 0.1–1.0)
Monocytes Relative: 6 % (ref 3–12)
Neutro Abs: 1.1 10*3/uL — ABNORMAL LOW (ref 1.7–7.7)

## 2010-06-05 LAB — CARDIAC PANEL(CRET KIN+CKTOT+MB+TROPI)
CK, MB: 3.6 ng/mL (ref 0.3–4.0)
CK, MB: 3.1 ng/mL (ref 0.3–4.0)
Relative Index: 0.7 (ref 0.0–2.5)
Troponin I: 0.02 ng/mL (ref 0.00–0.06)
Troponin I: 0.03 ng/mL (ref 0.00–0.06)

## 2010-06-05 LAB — LIPASE, BLOOD: Lipase: 22 U/L (ref 11–59)

## 2010-06-05 LAB — CK TOTAL AND CKMB (NOT AT ARMC): Total CK: 612 U/L — ABNORMAL HIGH (ref 7–177)

## 2010-06-05 LAB — BRAIN NATRIURETIC PEPTIDE: Pro B Natriuretic peptide (BNP): 905 pg/mL — ABNORMAL HIGH (ref 0.0–100.0)

## 2010-06-06 DIAGNOSIS — I5021 Acute systolic (congestive) heart failure: Secondary | ICD-10-CM

## 2010-06-06 LAB — LIPID PANEL
HDL: 57 mg/dL (ref >39)
Total CHOL/HDL Ratio: 3.4 RATIO

## 2010-06-06 LAB — T3, FREE: T3, Free: 1 pg/mL — ABNORMAL LOW (ref 2.3–4.2)

## 2010-06-06 LAB — BASIC METABOLIC PANEL
BUN: 17 mg/dL (ref 6–23)
Chloride: 106 mEq/L (ref 96–112)
Creatinine, Ser: 1.58 mg/dL — ABNORMAL HIGH (ref 0.4–1.2)
Glucose, Bld: 84 mg/dL (ref 70–99)
Potassium: 3.4 mEq/L — ABNORMAL LOW (ref 3.5–5.1)

## 2010-06-06 LAB — T4, FREE: Free T4: 0.3 ng/dL — ABNORMAL LOW (ref 0.80–1.80)

## 2010-06-06 NOTE — Consult Note (Signed)
[name], [name] NO.:  [id number]  MEDICAL RECORD NO.:  [id number]           PATIENT TYPE:  E  LOCATION:  [facility]                         FACILITY:  [facility]  PHYSICIAN:  Thad Ranger, MD       DATE OF BIRTH:  02/07/1951  DATE OF CONSULTATION:  06/05/2010 DATE OF DISCHARGE:                                CONSULTATION   REQUESTING PHYSICIAN:  [facility], Madolyn Frieze. Jens Som, MD  PRIMARY CARE PHYSICIAN:  Corwin Levins, MD  REASON FOR CONSULTATION:  Upper respiratory symptoms.  HISTORY OF PRESENT ILLNESS:  [name] is a 60 year old female with a history of COPD, hypertension, chronic bronchitis, left bundle branch block, history of coronary artery disease, history of toxic multinodular goiter, status post radioactive iodine treatment, who presented to the [facility] emergency room with chest discomfort.  History was obtained from the patient who stated that she has been having chest discomfort since Friday, 2 days ago.  The patient states that she started having intermittent chest pain, stabbing in sensation and midsternal, sometimes radiating to the back, and sometimes worse with cough and movement.  She also reported some chills but no fevers.  Since today morning, the patient's chest pain became constant, worse with movement and increasing dyspnea.  The patient presented to the emergency room and was given breathing treatment and oxygen, Dilaudid, aspirin.  She also states that she was having some nausea and vomiting x2 today.  She has been having cough since December and the patient was treated for possible bronchitis versus pneumonia by her PCP.  The patient used to be a heavy smoker with 2 packs per day and stated that she quit smoking 1 year ago, previously smoked about 30 years.  She denies any recent sick contacts, however, she walks in the [facility].  She denies any recent air travels or long distance car travels.  PAST  MEDICAL HISTORY: 1. COPD. 2. Chronic bronchitis. 3. Hypertension. 4. Anemia. 5. History of gallstones with cholecystectomy. 6. Coronary artery disease with cardiac catheterization in the past. 7. Left bundle branch block. 8. Degenerative joint disease of the hands.  ALLERGIES:  PENICILLIN gives her hives.  SOCIAL HISTORY:  The patient quit smoking 1 year ago, previously smoked 2 packs per day for 30 years.  Denies any alcohol or any drug use.  She currently lives alone.  PHYSICAL EXAMINATION:  VITAL SIGNS:  Blood pressure 118/75, pulse rate 60, respirations 18, temperature 97.5.  GENERAL:  The patient is alert, awake and oriented x3 not in any acute distress.  HEENT:  Anicteric sclerae, conjunctiva.  Pupils reactive to light and accommodation. EOMI.  NECK:  Supple.  No lymphadenopathy.  JVD about 8 cm. CARDIOVASCULAR:  S1, S2 clear.  LUNGS:  Bibasilar crackles with mild scattered rhonchi, however, no coarse breath sounds were heard. ABDOMEN:  Soft, normal bowel sounds.  No rebound or guarding. EXTREMITIES:  Trace edema.  No cyanosis or clubbing.  No Homans' sign. NEUROLOGIC:  No focal neurological deficits noted.  Cranial nerves II- XII intact.  DIAGNOSTIC DATA:  CBC white count 2.3, hemoglobin 12.6, hematocrit  38.8, platelets, 128, neutrophils 48%.  CMP, sodium 140, potassium 3.8, BUN 18, creatinine 1.4, previous creatinine in September 2011 was 1.0. Troponin I 0.03, creatinine kinase 612, MB 4.9, BNP 905.  RADIOLOGIC DATA:  Chest x-ray two-view June 05, 2010:  Stable cardiomegaly with no acute findings.  IMPRESSION:  [name] is a 60 year old female with a longstanding history of smoking and nicotine abuse, chronic obstructive pulmonary disease, chronic bronchitis, hypertension, history of prior coronary artery disease, presenting with chest pain with mild upper respiratory symptoms.  The patient does not appear to have any pneumonia on the chest  x-ray.  RECOMMENDATIONS: 1. Mild acute bronchitis with history of COPD and longstanding     nicotine abuse.  Currently, the patient is admitted under primary     service cardiology.  There is no frank pneumonia on her chest x-     ray.  She is not having any fevers or any leukocytosis.  She may     very well benefit from nebulizer treatments with Spiriva given her     heavy nicotine abuse and a history of COPD.  I would also start her     on Symbicort.  I have discontinued her Rocephin and Zithromax given     her penicillin allergy.  Since she is not actively wheezing, she     does not need any steroids at this moment.  We will place her on     Avelox 400 mg daily for 5 days.  She will also benefit from a home     O2 evaluation at the time of discharge to assess if she needs any     oxygen.  She also will benefit from a pulmonary function testing at     the time of discharge or as an outpatient with her PCP. 2. History of toxic multinodular goiter, status post radioactive     iodine treatment:  I will obtain TSH, free T4 and T3 levels for     assessment.  The patient is not on any hyper or hypothyroid     medications. 3. Chest pain with new onset CHF.  We will defer the management to     cardiology primary service. 4. Acute kidney injury:  Baseline creatinine 1.0 in September 2011.     Monitor closely and cautious diuresis. 5. Prophylaxis:  Heparin subcu. 6. This patient will be followed by [facility].  Thank you for the consult.  It was a pleasure taking care of [name].     Thad Ranger, MD     RR/MEDQ  D:  06/05/2010  T:  06/05/2010  Job:  [id number]  cc:   Corwin Levins, MD [address] [place] [address]  Madolyn Frieze. Jens Som, MD, [facility] [address]  [address] [place] [address]  Electronically Signed by Andres Labrum RAI  on 06/06/2010 03:51:20 PM

## 2010-06-07 LAB — BASIC METABOLIC PANEL
BUN: 21 mg/dL (ref 6–23)
CO2: 29 mEq/L (ref 19–32)
Chloride: 103 mEq/L (ref 96–112)
Creatinine, Ser: 1.86 mg/dL — ABNORMAL HIGH (ref 0.4–1.2)
Glucose, Bld: 88 mg/dL (ref 70–99)

## 2010-06-08 LAB — BASIC METABOLIC PANEL
BUN: 20 mg/dL (ref 6–23)
CO2: 27 mEq/L (ref 19–32)
Calcium: 8.9 mg/dL (ref 8.4–10.5)
Chloride: 105 mEq/L (ref 96–112)
Creatinine, Ser: 1.84 mg/dL — ABNORMAL HIGH (ref 0.4–1.2)
GFR calc Af Amer: 34 mL/min — ABNORMAL LOW (ref >60)
GFR calc non Af Amer: 28 mL/min — ABNORMAL LOW (ref >60)
Glucose, Bld: 90 mg/dL (ref 70–99)
Potassium: 4 mEq/L (ref 3.5–5.1)
Sodium: 138 mEq/L (ref 135–145)

## 2010-06-08 LAB — BRAIN NATRIURETIC PEPTIDE: Pro B Natriuretic peptide (BNP): 168 pg/mL — ABNORMAL HIGH (ref 0.0–100.0)

## 2010-06-09 LAB — BASIC METABOLIC PANEL
BUN: 21 mg/dL (ref 6–23)
CO2: 25 mEq/L (ref 19–32)
Chloride: 104 mEq/L (ref 96–112)
Creatinine, Ser: 1.62 mg/dL — ABNORMAL HIGH (ref 0.4–1.2)
GFR calc Af Amer: 39 mL/min — ABNORMAL LOW (ref >60)

## 2010-06-14 ENCOUNTER — Encounter: Payer: Self-pay | Admitting: Internal Medicine

## 2010-06-14 ENCOUNTER — Ambulatory Visit (INDEPENDENT_AMBULATORY_CARE_PROVIDER_SITE_OTHER): Payer: BC Managed Care – PPO | Admitting: Internal Medicine

## 2010-06-14 VITALS — BP 104/64 | HR 67 | Temp 97.3°F | Ht 65.0 in | Wt 132.4 lb

## 2010-06-14 DIAGNOSIS — I429 Cardiomyopathy, unspecified: Secondary | ICD-10-CM | POA: Insufficient documentation

## 2010-06-14 DIAGNOSIS — Z0001 Encounter for general adult medical examination with abnormal findings: Secondary | ICD-10-CM | POA: Insufficient documentation

## 2010-06-14 DIAGNOSIS — J449 Chronic obstructive pulmonary disease, unspecified: Secondary | ICD-10-CM

## 2010-06-14 DIAGNOSIS — Z0000 Encounter for general adult medical examination without abnormal findings: Secondary | ICD-10-CM | POA: Insufficient documentation

## 2010-06-14 DIAGNOSIS — I5022 Chronic systolic (congestive) heart failure: Secondary | ICD-10-CM | POA: Insufficient documentation

## 2010-06-14 DIAGNOSIS — I10 Essential (primary) hypertension: Secondary | ICD-10-CM

## 2010-06-14 DIAGNOSIS — I5020 Unspecified systolic (congestive) heart failure: Secondary | ICD-10-CM

## 2010-06-14 DIAGNOSIS — E039 Hypothyroidism, unspecified: Secondary | ICD-10-CM

## 2010-06-14 DIAGNOSIS — I428 Other cardiomyopathies: Secondary | ICD-10-CM | POA: Insufficient documentation

## 2010-06-14 HISTORY — DX: Chronic obstructive pulmonary disease, unspecified: J44.9

## 2010-06-14 MED ORDER — SPIRONOLACTONE 50 MG PO TABS: 50.0000 mg | ORAL_TABLET | Freq: Every day | ORAL | Status: DC

## 2010-06-14 NOTE — Assessment & Plan Note (Signed)
stable overall by hx and exam, most recent lab reviewed with pt, and pt to continue medical treatment as before , to f/u with cardiologty as planned

## 2010-06-14 NOTE — Assessment & Plan Note (Signed)
stable overall by hx and exam, most recent lab reviewed with pt, and pt to continue medical treatment as before 

## 2010-06-14 NOTE — Patient Instructions (Addendum)
Continue all other medications as before You are given the work note today Please call [phone] if the spironolactone is different from 50 mg, as this will need to be changed on your record Please keep your appt with Dr Jens Som as planned Please return for LAB only in 4 wks :  TSH (for thyroid) Please return in 6 mo with Lab testing done 3-5 days before

## 2010-06-14 NOTE — Assessment & Plan Note (Addendum)
On new replacement therapy, for repeat TSH in 4 wks, o/w stable overall by hx and exam, most recent lab reviewed with pt, and pt to continue medical treatment as before

## 2010-06-14 NOTE — Progress Notes (Signed)
Subjective:    Patient ID: [name], female    DOB: 1950/07/18, 60 y.o.   MRN: [id number]  HPI here to f/u post hospn;  I have no d/c summary to review on echart; hx per pt, labs results, initial Hx and initial card consult;  Pt was hosp'd mar 26 - mar 30; admitted for CP, seen by cardiology, had IV antibx, CXR neg for pna but course complicated by new onset mild CHF and renal insufficiency.  Had echo with severe reduction EF to 10-15%, mild MR;  tx also with IV lasix and overall breathing improved.  Has mild elev LDL to 116, but severe elev TSH over 80 s/p rad iodine tx x 2, pt apparently without specific f/u with Dr Everardo All per pt. No current med changes, but new meds added - avelox for 3 days (completed) , lasix 40 qd, carved 3.125 bid, levothyroxine 50, Kdur 20 qd, and alb inhaler prn. Also has new spironolactone at home which she takes but not sure of dose,  no bottle today, not listed on her med list to go home, and no d/c summary to check by me today.  Overall good compliance with treatment, and good medicine tolerability.   Pt denies fever, wt loss, loss of appetite, or other constitutional symptoms  Except for hot sweats at night.  Pt denies chest pain, increased sob or doe, wheezing, orthopnea, PND, increased LE swelling, palpitations, dizziness or syncope.  Did have a lower BP per home nurse [phone] with weakness at that time by home nurse mon apr 2, but no symptoms since that time.  Wt at home has been up and down 129-133 but overall stable. Pt denies new neurological symptoms such as new headache, or facial or extremity weakness or numbness.  Due to f/u Dr Jens Som /card apr 27.  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 Past Medical History  Diagnosis Date  . Hypertension   . Allergic rhinitis   . Genital warts     hx  . Anemia, iron deficiency   . Blood transfusion complicating pregnancy     after first child at 81 yo  . DJD (degenerative joint disease)     hands    . Cervical disc disease     with recurrent radicular pain, Dr. Montez Morita  . COPD (chronic obstructive pulmonary disease) 06/14/2010  . ALLERGIC RHINITIS 03/17/2008  . HYPERTENSION 03/17/2008  . ANEMIA-IRON DEFICIENCY 03/17/2008  . GOITER, MULTINODULAR 12/23/2009   Past Surgical History  Procedure Date  . Cholecystectomy   . Cardiac catheterization 1995    with "timy blockage" [facility] Dr. Daisy Floro    reports that she has been smoking.  She does not have any smokeless tobacco history on file. She reports that she does not drink alcohol or use illicit drugs. family history includes Alcohol abuse in her other; Cancer in her sister; Depression in her sister; Diabetes in her father, mother, and sister; Heart disease in her father, mother, and sister; Hypertension in her father, mother, and sister; Stroke in her father and mother; Thyroid disease in her sister; and Transient ischemic attack in her maternal grandfather. Allergies  Allergen Reactions  . Penicillins     REACTION: rash   Review of Systems Review of Systems  Constitutional: Negative for diaphoresis and unexpected weight change.  HENT: Negative for drooling and tinnitus.   Eyes: Negative for photophobia and visual disturbance.  Respiratory: Negative for choking and stridor.   Gastrointestinal: Negative for vomiting  and blood in stool.  Genitourinary: Negative for hematuria and decreased urine volume.  Musculoskeletal: Negative for gait problem.  Skin: Negative for color change and wound.  Neurological: Negative for tremors and numbness.  Psychiatric/Behavioral: Negative for decreased concentration. The patient is not hyperactive.       Objective:   Physical Exam BP 104/64  Pulse 67  Temp(Src) 97.3 F (36.3 C) (Oral)  Ht 5\' 5"  (1.651 m)  Wt 132 lb 6 oz (60.045 kg)  BMI 22.03 kg/m2  SpO2 99% Physical Exam  VS noted Constitutional: Pt appears well-developed and well-nourished.  HENT: Head: Normocephalic.  No JVD Right Ear:  External ear normal.  Left Ear: External ear normal.  Eyes: Conjunctivae and EOM are normal. Pupils are equal, round, and reactive to light.  Neck: Normal range of motion. Neck supple.  Cardiovascular: Normal rate and regular rhythm.   Pulmonary/Chest: Effort normal and breath sounds normal.  Abd:  Soft, NT, non-distended, + BS Neurological: Pt is alert. No cranial nerve deficit.  Skin: Skin is warm. No erythema. No peripheral edema Psychiatric: Pt behavior is normal. Thought content normal.          Assessment & Plan:

## 2010-06-20 NOTE — H&P (Signed)
NAMEAZARYA, OCONNELL NO.:  [id number]  MEDICAL RECORD NO.:  [id number]           PATIENT TYPE:  E  LOCATION:  [facility]LED                         FACILITY:  [facility]  PHYSICIAN:  Madolyn Frieze. Jens Som, MD, FACCDATE OF BIRTH:  1950-12-19  DATE OF ADMISSION:  06/05/2010 DATE OF DISCHARGE:                             HISTORY & PHYSICAL   PRIMARY CARDIOLOGIST:  New patient to [facility], currently being evaluated by Madolyn Frieze. Jens Som, M.D.  PRIMARY CARE PROVIDER:  Corwin Levins, M.D.  CHIEF COMPLAINT:  Shortness of breath and chest pain.  HISTORY OF PRESENT ILLNESS:  This is a 60 year old African American female with questionable of nonobstructive coronary artery disease per cardiac catheterization by Dr. Daisy Floro may be in the 70s, hypertension, and toxic multinodular goiter who states she has had shortness of breath since December 2011.  The patient initially had a productive cough and was treated with Levaquin in February 2012.  She now complains of progressive dyspnea on exertion, orthopnea, PND, and pedal edema.  Over the past 3 days, the patient has noted substernal chest pain that she describes as a sharp sensation that is increased with cough, lying flat, and inspiration.  She has also noted productive cough with green, yellow sputum.  She denies any fevers but states she has had chills.  She states she has also had a sore throat and questionable palpitations. She denies any change in bowel movements.  She denies any nausea, vomiting, or diarrhea.  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  She came to the [facility] Department for further evaluation.    In the emergency department, her initial lab work showed a BNP of 905 as well as a creatinine of 1.46.  Chest x-ray showed cardiomegaly but no acute findings.  Her EKG showed a left  bundle-branch block but sinus rhythm.  Initial cardiac enzymes are negative.  The patient has been given aspirin, Dilaudid, and Zofran with improvement of shortness of breath.  Of note, the patient was dyspneic during conversation, but after being placed on oxygen at 2 L, her O2 saturation improved and she states she felt much better.  The patient also has a white blood cell count of 2.3.  This appears to be chronic as prior lab draws have shown 4.3 as well as 3.8.  Cardiology was asked to admit the patient for further evaluation.  PAST MEDICAL HISTORY: 1. Questionable nonobstructive coronary artery disease per cardiac     catheterization by Dr. Meryl Crutch in the late 70s.  No further     cardiac workup. 2. Left bundle-branch block. 3. Hypertension. 4. Joint disease of the hands. 5. Toxic multinodular goiter, status post radioactive iodine in     September 2011. 6. Status post cholecystectomy.  SOCIAL HISTORY:  The patient lives in [place].  She is single with 3 children.  She is an Loss adjuster, chartered.  She has a history of tobacco abuse but quit approximately 1 year ago.  She denies any alcohol or illicit drug use.  FAMILY HISTORY:  Pertinent for stroke, hypertension, heart disease, and congestive heart failure in her mother.  Pertinent for myocardial infarction in her father.  Pertinent for diabetes mellitus and myocardial infarction in her siblings, although no early coronary artery disease.  ALLERGIES:  PENICILLIN.  HOME MEDICATIONS: 1. Lisinopril 20 mg daily. 2. Vitamin D3 is 1000 units daily. 3. Aspirin 81 mg daily.  REVIEW OF SYSTEMS:  All pertinent positives and negatives as stated in the HPI.  All other systems have been reviewed and are negative.  PHYSICAL EXAMINATION:  VITAL SIGNS:  Temperature 98 degrees, pulse 75, respirations 18, blood pressure 118-140 over 75-89, and O2 saturation of 99% on 2 L. GENERAL:  This is a well-developed,  well-nourished, middle-aged female, mildly tachypneic with conversation. HEENT:  Normal. NECK:  Supple without bruit, JVD noted to be approximately 8 cm. HEART:  Regular rate and rhythm with S1 and S2.  No murmur, rub, or gallop noted.  PMI is normal.  Pulses are 2+ and equal bilaterally. Sternum is tender to palpation. LUNGS:  Positive bilateral crackles.  No wheezing or rhonchi noted. ABDOMEN:  Soft, diffusely tender, positive bowel sounds x4. EXTREMITIES:  No clubbing, cyanosis, or edema. EXTREMITIES:  Cool to touch. MUSCULOSKELETAL:  No joint deformities or effusions. NEURO:  Alert and oriented x3.  Cranial nerves II-XII grossly intact.  DIAGNOSTIC STUDIES:  Chest x-ray showing stable cardiomegaly without acute finding.  EKG showing sinus rhythm with left bundle-branch block. Rate is 64 beats per minute.  This is unchanged from prior tracing.  LABORATORY DATA:  WBC of 2.3, hemoglobin 12.6, hematocrit 38.8, platelets 128,000.  Sodium 140, potassium 3.8, chloride 106, bicarb 25, BUN 18, creatinine 1.46, glucose 103.  BNP 905.  CK 612, CK-MB 4.9, troponin 0.03.  ASSESSMENT AND PLAN:  This is a 60 year old female with history of hypertension, hypothyroidism, and left bundle-branch block who is being evaluated for shortness of breath since 2012 as well as substernal chest pain for the past 72 hours.  She also endorses a productive cough with green sputum.  Dyspnea is most likely secondary combination of an upper respiratory infection with new onset congestive heart failure.  The patient will be admitted to telemetry.  We will have gentle diuresis with IV Lasix secondary to the patient's acute renal insufficiency. Renal function will be monitored daily.  We will continue home medication of ACE inhibitor.  We will have strict in's and out's as well as daily weights.  We will also check an echocardiogram to quantify left ventricular function.  The patient's chest pain is likely atypical  in nature as it is constant as well as pleuritic.  We feel this is most likely secondary to an upper respiratory infection.  The patient will be placed on Rocephin and azithromycin at this time, and we will get the hospitalist service to see for further recommendations.  We will cycle cardiac markers, although initial set is negative.  Doubt that this is ischemic.  Of note, the patient's WBC is 2.3 which is appearing to be a chronic issue.  We will follow at this time with questionable need for a hematology consult or just followup as an outpatient.  She also has decreased platelet count at 128,000.  At this time, there is no need to start heparin secondary to atypical chest pain.  Further treatment will be dependent upon the above.  The patient's hypertension will be followed daily, and she will be continued  on home medication.     Leonette Monarch, PA-C   ______________________________ Madolyn Frieze. Jens Som, MD, [facility]    NB/MEDQ  D:  06/05/2010  T:  06/05/2010  Job:  [id number]  cc:   Corwin Levins, MD [address] [place] [address]  Madolyn Frieze. Jens Som, MD, [facility] [address]  [address] [place] [address]  Electronically Signed by Alen Blew P.A. on 06/19/2010 11:29:20 AM Electronically Signed by Olga Millers MD [facility] on 06/20/2010 05:44:18 PM

## 2010-07-07 ENCOUNTER — Encounter: Payer: Self-pay | Admitting: Cardiology

## 2010-07-07 ENCOUNTER — Ambulatory Visit (INDEPENDENT_AMBULATORY_CARE_PROVIDER_SITE_OTHER): Payer: BC Managed Care – PPO | Admitting: Cardiology

## 2010-07-07 DIAGNOSIS — I10 Essential (primary) hypertension: Secondary | ICD-10-CM

## 2010-07-07 DIAGNOSIS — R072 Precordial pain: Secondary | ICD-10-CM

## 2010-07-07 DIAGNOSIS — I428 Other cardiomyopathies: Secondary | ICD-10-CM

## 2010-07-07 DIAGNOSIS — R079 Chest pain, unspecified: Secondary | ICD-10-CM | POA: Insufficient documentation

## 2010-07-07 DIAGNOSIS — E039 Hypothyroidism, unspecified: Secondary | ICD-10-CM

## 2010-07-07 MED ORDER — CARVEDILOL 6.25 MG PO TABS: 6.2500 mg | ORAL_TABLET | Freq: Two times a day (BID) | ORAL | Status: DC

## 2010-07-07 NOTE — Assessment & Plan Note (Signed)
Management per primary care. Synthroid initiated.

## 2010-07-07 NOTE — Progress Notes (Signed)
HPI: 60 year old female for followup of congestive heart failure. Admitted in March of 2012 with complaints of dyspnea. BNP elevated at 904. Initial BUN and creatinine 18 and 1.46. TSH elevated at 81. Echocardiogram in March of 2012 showed an ejection fraction of 10-15%, mild mitral regurgitation and a small pericardial effusion. Patient also treated for COPD. Patient's discharge summary is not available. Patient was admitted with dyspnea on exertion, pedal edema and chest pain. Her chest increased with inspiration and lying flat. She was treated with diuretics and ACE inhibitors/beta blockers. Since discharge her symptoms had improved. She does have some dyspnea on exertion but improved compared to hospitalization. Her pedal edema has resolved. She continues to have chest pain with lying flat and improved with sitting up. It has improved since the hospital. There is been no palpitations or syncope. No fevers, chills or productive cough.  Current Outpatient Prescriptions  Medication Sig Dispense Refill  . albuterol (PROVENTIL,VENTOLIN) 90 MCG/ACT inhaler Inhale 2 puffs into the lungs every 4 (four) hours as needed.        Marland Kitchen aspirin 81 MG EC tablet Take 81 mg by mouth daily.        . budesonide-formoterol (SYMBICORT) 160-4.5 MCG/ACT inhaler Inhale 2 puffs into the lungs 2 (two) times daily.        . carvedilol (COREG) 3.125 MG tablet Take 3.125 mg by mouth 2 (two) times daily.        . Cholecalciferol (VITAMIN D3) 1000 UNITS CAPS Take by mouth daily.        . fexofenadine (ALLEGRA) 180 MG tablet Take 180 mg by mouth daily.        . furosemide (LASIX) 40 MG tablet Take 40 mg by mouth daily.        Marland Kitchen levothyroxine (SYNTHROID, LEVOTHROID) 50 MCG tablet Take 50 mcg by mouth daily.        Marland Kitchen lisinopril (PRINIVIL,ZESTRIL) 20 MG tablet Take 20 mg by mouth daily.        . potassium chloride (KLOR-CON) 20 MEQ packet Take 20 mEq by mouth daily.        Marland Kitchen DISCONTD: spironolactone (ALDACTONE) 50 MG tablet Take 1  tablet (50 mg total) by mouth daily.  30 tablet  11     Past Medical History  Diagnosis Date  . Hypertension   . Allergic rhinitis   . Genital warts     hx  . Anemia, iron deficiency   . Blood transfusion complicating pregnancy     after first child at 34 yo  . DJD (degenerative joint disease)     hands  . Cervical disc disease     with recurrent radicular pain, Dr. Montez Morita  . COPD (chronic obstructive pulmonary disease) 06/14/2010  . ALLERGIC RHINITIS 03/17/2008  . HYPERTENSION 03/17/2008  . ANEMIA-IRON DEFICIENCY 03/17/2008  . GOITER, MULTINODULAR 12/23/2009  . Cardiomyopathy   . Systolic CHF   . Hypothyroid     Past Surgical History  Procedure Date  . Cholecystectomy   . Cardiac catheterization 1995    with "timy blockage" [facility] Dr. Daisy Floro    History   Social History  . Marital Status: Married    Spouse Name: N/A    Number of Children: 3  . Years of Education: N/A   Occupational History  .     Social History Main Topics  . Smoking status: Current Everyday Smoker  . Smokeless tobacco: Not on file   Comment: very light, occasional stopped she states now  .  Alcohol Use: No  . Drug Use: No  . Sexually Active: Not on file   Other Topics Concern  . Not on file   Social History Narrative  . No narrative on file    ROS: no fevers or chills, productive cough, hemoptysis, dysphasia, odynophagia, melena, hematochezia, dysuria, hematuria, rash, seizure activity, orthopnea, PND, pedal edema, claudication. Remaining systems are negative.  Physical Exam: Well-developed well-nourished in no acute distress.  Skin is warm and dry.  HEENT is normal.  Neck is supple. No thyromegaly.  Chest is clear to auscultation with normal expansion.  Cardiovascular exam is regular rate and rhythm. Positive gallop and rub. Abdominal exam nontender or distended. No masses palpated. Extremities show no edema. neuro grossly intact  ECG Sinus rhythm with left bundle branch  block.

## 2010-07-07 NOTE — Assessment & Plan Note (Signed)
Patient continues to have chest pain increased with lying flat and inspiration. This may be secondary to pericarditis. It is improving since discharge. I will not add a nonsteroidal and renal insufficiency. We will consider steroids if her symptoms persist.

## 2010-07-07 NOTE — Assessment & Plan Note (Signed)
Blood pressure controlled. Continue present medications. Check potassium and renal function. 

## 2010-07-07 NOTE — Assessment & Plan Note (Signed)
I do not have a discharge summary available or progress notes. I assume her cardiomyopathy is felt secondary to her severe hypothyroidism. We will obtain records from the hospital. Continue ACE inhibitor. Increase Coreg to 6.25 mg p.o. B.i.d. Plan repeat echocardiogram in 3 months to see if LV function has improved with the addition of Synthroid. If not she most likely require cardiac catheterization to exclude coronary disease.

## 2010-07-07 NOTE — Patient Instructions (Signed)
Your physician recommends that you return for lab work next week: you may go to the [place] lab to have this done along with labs ordered by Dr. Jonny Ruiz.  Your physician has recommended you make the following change in your medication: Increase Coreg (carvediolol) to 6.25mg  one tablet twice daily.  Your physician recommends that you schedule a follow-up appointment in: 6 weeks.

## 2010-07-10 ENCOUNTER — Other Ambulatory Visit: Payer: BC Managed Care – PPO

## 2010-07-26 ENCOUNTER — Emergency Department (HOSPITAL_COMMUNITY): Payer: BC Managed Care – PPO

## 2010-07-26 ENCOUNTER — Inpatient Hospital Stay (HOSPITAL_COMMUNITY)
Admission: EM | Admit: 2010-07-26 | Discharge: 2010-08-01 | DRG: 127 | Disposition: A | Discharge status: Home / Self Care | Payer: BC Managed Care – PPO | Attending: Cardiology | Admitting: Cardiology

## 2010-07-26 DIAGNOSIS — M1990 Unspecified osteoarthritis, unspecified site: Secondary | ICD-10-CM | POA: Diagnosis present

## 2010-07-26 DIAGNOSIS — Z79899 Other long term (current) drug therapy: Secondary | ICD-10-CM

## 2010-07-26 DIAGNOSIS — E039 Hypothyroidism, unspecified: Secondary | ICD-10-CM | POA: Diagnosis present

## 2010-07-26 DIAGNOSIS — I509 Heart failure, unspecified: Secondary | ICD-10-CM | POA: Diagnosis present

## 2010-07-26 DIAGNOSIS — I447 Left bundle-branch block, unspecified: Secondary | ICD-10-CM | POA: Diagnosis present

## 2010-07-26 DIAGNOSIS — I059 Rheumatic mitral valve disease, unspecified: Secondary | ICD-10-CM | POA: Diagnosis present

## 2010-07-26 DIAGNOSIS — I129 Hypertensive chronic kidney disease with stage 1 through stage 4 chronic kidney disease, or unspecified chronic kidney disease: Secondary | ICD-10-CM | POA: Diagnosis present

## 2010-07-26 DIAGNOSIS — I5023 Acute on chronic systolic (congestive) heart failure: Principal | ICD-10-CM | POA: Diagnosis present

## 2010-07-26 DIAGNOSIS — J449 Chronic obstructive pulmonary disease, unspecified: Secondary | ICD-10-CM | POA: Diagnosis present

## 2010-07-26 DIAGNOSIS — N183 Chronic kidney disease, stage 3 unspecified: Secondary | ICD-10-CM | POA: Diagnosis present

## 2010-07-26 DIAGNOSIS — Z880 Allergy status to penicillin: Secondary | ICD-10-CM

## 2010-07-26 DIAGNOSIS — J4489 Other specified chronic obstructive pulmonary disease: Secondary | ICD-10-CM | POA: Diagnosis present

## 2010-07-26 DIAGNOSIS — R0602 Shortness of breath: Secondary | ICD-10-CM

## 2010-07-26 DIAGNOSIS — Z7982 Long term (current) use of aspirin: Secondary | ICD-10-CM

## 2010-07-26 DIAGNOSIS — Z87891 Personal history of nicotine dependence: Secondary | ICD-10-CM

## 2010-07-26 DIAGNOSIS — I2510 Atherosclerotic heart disease of native coronary artery without angina pectoris: Secondary | ICD-10-CM | POA: Diagnosis present

## 2010-07-26 DIAGNOSIS — E890 Postprocedural hypothyroidism: Secondary | ICD-10-CM | POA: Diagnosis present

## 2010-07-26 LAB — POCT CARDIAC MARKERS
CKMB, poc: <1 ng/mL — ABNORMAL LOW (ref 1.0–8.0)
Myoglobin, poc: 75.2 ng/mL (ref 12–200)
Troponin i, poc: <0.05 ng/mL (ref 0.00–0.09)

## 2010-07-26 LAB — CARDIAC PANEL(CRET KIN+CKTOT+MB+TROPI)
CK, MB: 2.4 ng/mL (ref 0.3–4.0)
Total CK: 208 U/L — ABNORMAL HIGH (ref 7–177)

## 2010-07-26 LAB — POCT I-STAT, CHEM 8
Calcium, Ion: 1.09 mmol/L — ABNORMAL LOW (ref 1.12–1.32)
Creatinine, Ser: 1.7 mg/dL — ABNORMAL HIGH (ref 0.4–1.2)
Glucose, Bld: 96 mg/dL (ref 70–99)
Hemoglobin: 12.9 g/dL (ref 12.0–15.0)
TCO2: 23 mmol/L (ref 0–100)

## 2010-07-26 LAB — PROTIME-INR: Prothrombin Time: 14 seconds (ref 11.6–15.2)

## 2010-07-26 LAB — DIFFERENTIAL
Basophils Absolute: 0 10*3/uL (ref 0.0–0.1)
Basophils Relative: 1 % (ref 0–1)
Eosinophils Absolute: 0.1 10*3/uL (ref 0.0–0.7)
Eosinophils Relative: 3 % (ref 0–5)
Lymphocytes Relative: 27 % (ref 12–46)
Lymphs Abs: 1.1 10*3/uL (ref 0.7–4.0)
Monocytes Absolute: 0.2 10*3/uL (ref 0.1–1.0)
Monocytes Relative: 6 % (ref 3–12)
Neutro Abs: 2.6 10*3/uL (ref 1.7–7.7)
Neutrophils Relative %: 64 % (ref 43–77)

## 2010-07-26 LAB — CBC
HCT: 36.6 % (ref 36.0–46.0)
MCV: 87.1 fL (ref 78.0–100.0)
RBC: 4.2 MIL/uL (ref 3.87–5.11)
RDW: 15.5 % (ref 11.5–15.5)
WBC: 4.1 10*3/uL (ref 4.0–10.5)

## 2010-07-26 LAB — D-DIMER, QUANTITATIVE: D-Dimer, Quant: 0.77 ug/mL-FEU — ABNORMAL HIGH (ref 0.00–0.48)

## 2010-07-27 ENCOUNTER — Inpatient Hospital Stay (HOSPITAL_COMMUNITY): Payer: BC Managed Care – PPO

## 2010-07-27 DIAGNOSIS — I319 Disease of pericardium, unspecified: Secondary | ICD-10-CM

## 2010-07-27 DIAGNOSIS — I5022 Chronic systolic (congestive) heart failure: Secondary | ICD-10-CM

## 2010-07-27 LAB — CARDIAC PANEL(CRET KIN+CKTOT+MB+TROPI)
CK, MB: 2.1 ng/mL (ref 0.3–4.0)
CK, MB: 2 ng/mL (ref 0.3–4.0)
Relative Index: 1.2 (ref 0.0–2.5)
Total CK: 174 U/L (ref 7–177)
Troponin I: <0.3 ng/mL (ref <0.30)
Troponin I: <0.3 ng/mL (ref <0.30)

## 2010-07-27 LAB — CBC
HCT: 36.9 % (ref 36.0–46.0)
Hemoglobin: 11.9 g/dL — ABNORMAL LOW (ref 12.0–15.0)
MCH: 27.9 pg (ref 26.0–34.0)
MCV: 86.4 fL (ref 78.0–100.0)
Platelets: 174 10*3/uL (ref 150–400)
RBC: 4.27 MIL/uL (ref 3.87–5.11)
RDW: 15.5 % (ref 11.5–15.5)

## 2010-07-27 LAB — BASIC METABOLIC PANEL
Calcium: 9.1 mg/dL (ref 8.4–10.5)
GFR calc Af Amer: 44 mL/min — ABNORMAL LOW (ref >60)
GFR calc non Af Amer: 36 mL/min — ABNORMAL LOW (ref >60)
Glucose, Bld: 93 mg/dL (ref 70–99)
Potassium: 3.4 mEq/L — ABNORMAL LOW (ref 3.5–5.1)
Sodium: 135 mEq/L (ref 135–145)

## 2010-07-27 LAB — LIPID PANEL
Cholesterol: 176 mg/dL (ref 0–200)
Total CHOL/HDL Ratio: 3.5 RATIO
VLDL: 21 mg/dL (ref 0–40)

## 2010-07-28 LAB — BASIC METABOLIC PANEL
BUN: 30 mg/dL — ABNORMAL HIGH (ref 6–23)
Chloride: 98 mEq/L (ref 96–112)
Creatinine, Ser: 1.76 mg/dL — ABNORMAL HIGH (ref 0.4–1.2)
GFR calc non Af Amer: 29 mL/min — ABNORMAL LOW (ref >60)
Glucose, Bld: 93 mg/dL (ref 70–99)
Potassium: 4.1 mEq/L (ref 3.5–5.1)

## 2010-07-29 DIAGNOSIS — I5023 Acute on chronic systolic (congestive) heart failure: Secondary | ICD-10-CM

## 2010-07-29 LAB — BASIC METABOLIC PANEL
BUN: 24 mg/dL — ABNORMAL HIGH (ref 6–23)
Chloride: 101 mEq/L (ref 96–112)
Creatinine, Ser: 1.51 mg/dL — ABNORMAL HIGH (ref 0.4–1.2)
Glucose, Bld: 82 mg/dL (ref 70–99)
Potassium: 4.5 mEq/L (ref 3.5–5.1)

## 2010-07-30 LAB — BASIC METABOLIC PANEL
BUN: 32 mg/dL — ABNORMAL HIGH (ref 6–23)
CO2: 28 mEq/L (ref 19–32)
Chloride: 98 mEq/L (ref 96–112)
Creatinine, Ser: 1.62 mg/dL — ABNORMAL HIGH (ref 0.4–1.2)

## 2010-07-31 LAB — BASIC METABOLIC PANEL
BUN: 39 mg/dL — ABNORMAL HIGH (ref 6–23)
CO2: 29 mEq/L (ref 19–32)
Chloride: 97 mEq/L (ref 96–112)
Creatinine, Ser: 2.03 mg/dL — ABNORMAL HIGH (ref 0.4–1.2)
Glucose, Bld: 78 mg/dL (ref 70–99)

## 2010-07-31 NOTE — H&P (Addendum)
[name], [name] NO.:  [id number]  MEDICAL RECORD NO.:  [id number]           PATIENT TYPE:  I  LOCATION:  1407                         FACILITY:  [facility]  PHYSICIAN:  Colleen Can. Deborah Chalk, M.D.DATE OF BIRTH:  Nov 02, 1950  DATE OF ADMISSION:  07/26/2010 DATE OF DISCHARGE:                             HISTORY & PHYSICAL   PRIMARY CARDIOLOGIST:  Madolyn Frieze. Jens Som, MD, [facility]  PRIMARY MEDICAL DOCTOR:  Dr. Earlean Polka.  CHIEF COMPLAINT:  Shortness of breath.  HISTORY OF PRESENT ILLNESS:  Ms. Principato is a 60 year old female whom we first met in March 2012 when she had new onset heart failure diagnosed with an EF 10% to 15% that time with a small pericardial effusion, left bundle-branch block, and COPD.  She also has a history of reportedly nonobstructive CAD by remote catheterization, and at that time, her heart failure diagnosis was also found to be hypothyroid with a TSH of 81.  She also has a history of renal insufficiency.  She presented to the [facility] with complaints of shortness of breath.  Again on Sunday, when she felt somewhat clumsy, though she was walking over things that were not there.  She was finding it hard to get her breath and tried her nebulizer inhalers without relief.  She did not go to church or to dinner because of these symptoms.  She was also noted progressively worse chest pain, intermittent, occurring every 15 minutes. It is not particularly associated with exertion, but she does notice it when she lifts heavy bags at her job but at other times when she exerts herself, she does not get pain. She is, however, limited by  shortness of breath on exertion.  The chest pain is worse on inspiration. It is relieved by resting and leaning towards the right.  She also endorse some orthopnea, but  denies any weight gain.  She also has a particular spot tender to palptation in her back that helps exacerbate the chest pain. Cardiac enzymes are  negative x1.  EKG demonstrates a known left bundle- branch block.  Chest x-ray was without active lung disease and BNP is mildly elevated at 880.  She has 80 mg of IV Lasix ordered for the ER.  PAST MEDICAL HISTORY: 1. Newly diagnosed CHF on March 2012 with EF of 10% to 15% at that     time by echo.     a.     Dr. Crenshaw postulated in his office visit in April 2012,      but this was possibly secondary to severe hypothyroidism, and      plans for repeat echo in July 2012 with catheterization and her EF      had not improved with therapy. 2. Mild MR by echo on May 30, 2010. 3. Small pericardial effusion by echo on June 05, 2010. 4. History of nonobstructive CAD remotely, approximately 20-30 years     ago. 5. Renal insufficiency with discharge creatinine 1.62 on March 30,     20 12. 6. Left bundle-branch block. 7. Hypertension. 8. Degenerative joint disease and cervical disk disease. 9. Toxic multinodular goiter, status post  radioactive iodine therapy     September 2011 with subsequent hypothyroidism. 10.Cholecystectomy. 11.COPD. 12.Elevated blood glucose, March 2012.  OUTPATIENT MEDICATIONS: 1. Albuterol 2 puffs inhaled q.4 h. p.r.n. 2. Aspirin 81 mg daily. 3. Symbicort 160 mg/4.5 mcg 2 puffs b.i.d. 4. Coreg 6.25 mg b.i.d. 5. Vitamin E 180 mg daily. 6. Lasix 40 mg daily. 7. Synthroid 50 mcg daily. 8. Lisinopril 20 mg daily. 9. Potassium chloride 20 mEq daily. 10.Spironolactone 50 mg daily.  ALLERGIES:  PENICILLIN.  SOCIAL HISTORY:  Ms. Vida lives in [place].  She is single with 3 children.  She quit smoking 1 year ago.  She denies any alcohol use. She works as a Loss adjuster, chartered.  FAMILY HISTORY:  Her mother had CVA, hypertension, and CHF.  Father had an MI.  She has siblings who had diabetes and MI.  REVIEW OF SYSTEMS:  No fevers, chills, weight gain.  Positive for chest pain, shortness of breath, and orthopnea, as well as possible claudication.   She endorses swelling over abdomen in the right hand and lower extremities, although these are not present today.  Abdomen is however, slightly distended.  No melena or hematemesis.  All other systems reviewed and otherwise negative.  LABORATORY DATA:  WBC 4.1, hemoglobin 12.9, hematocrit 38, platelet count 157.  Sodium 140, potassium 3.9, chloride 108, glucose 96, BUN 21, creatinine 1.7.  Cardiac enzymes negative x1.  BNP 880.  EKG, normal sinus rhythm with T-wave inversion, II, III, aVF in V5-V6, the V5 inversion is new.  RADIOLOGY:  Chest x-ray showed stable moderate cardiomegaly with no active lung disease.  PHYSICAL EXAMINATION:  VITAL SIGNS:  Temperature 97.3, pulse 54, respirations 22, blood pressure 119/85, pulse ox 98% on room air. GENERAL:  This is a pleasant, thin, African American female, in no acute distress. HEENT:  Normocephalic and atraumatic with extraocular movements intact and clear sclera.  Nares without discharge. NECK:  Supple without carotid bruit. HEART:  Auscultation of the heart reveals regular rate and rhythm with S1 and S2 without murmurs, rubs, or gallops. LUNGS:  Sounds are quite diminished bilaterally throughout without wheezes, rales, or rhonchi. ABDOMEN:  Soft, nontender, nondistended with positive bowel sounds. EXTREMITIES:  Cool, dry, without edema.  She has decreased pedal pulses bilaterally. NEUROLOGICALLY:  She is alert and oriented x3, responds to questions appropriately with normal affect.  ASSESSMENT AND PLAN:  The patient was seen and examined by Dr. Deborah Chalk and myself.  This is a 60 year old female with a history of left bundle- branch block, renal insufficiency, reported remote nonobstructive coronary artery disease, and heart failure diagnosed March 2012 with an ejection fraction 10% to 15% thought secondary to hypothyroidism, for which she has subsequently been on treatment for just under 2 months. She presented to the [facility] with a history that is not clear-cut, with chest pain that is not entirely exertional, sometimes worse with inspiration, sometimes positional, but shortness of breath/dyspnea on exertion as well as orthopnea  without obvious lung exam for heart failure or weight gain.  Her dyspnea may be factorial, and her chest pain is somewhat atypical in nature.  At this time, we plan to rule the patient out for MI with cardiac enzymes.  She prefers to stay at [facility] we will honor her request.  We will repeat a 2- D echocardiogram to determine if her EF is improved since the initiation of thyroid therapy.  We will continue her current medications, including her ACE inhibitor and spironolactone.  Bronchodilators will be continued.  In regards to labs, we will follow her BMP, check thyroid studies and D-dimer.  Differential for chest pain includes pericarditis, pericardial effusion, pulmonary embolism, and is less likely to represent ACS at this time.  We will continue her current diuretic given her empiric dose of Lasix here in the ER and watch her renal function. She may require cardiac catheterization this admission to further define her coronary anatomy with possible resynchronization evaluation as well. May also consider lower extremity arterial studies, given the patient's report of occasional leg cramping and coldness.     Dayna Dunn, P.A.C.   ______________________________ Colleen Can. Deborah Chalk, M.D.    DD/MEDQ  D:  07/26/2010  T:  07/27/2010  Job:  [id number]  Electronically Signed by Ronie Spies  on 07/31/2010 01:56:35 PM Electronically Signed by Roger Shelter M.D. on 10/09/2010 11:15:08 AM

## 2010-08-01 LAB — BASIC METABOLIC PANEL
BUN: 29 mg/dL — ABNORMAL HIGH (ref 6–23)
CO2: 27 mEq/L (ref 19–32)
Calcium: 9.4 mg/dL (ref 8.4–10.5)
Creatinine, Ser: 1.38 mg/dL — ABNORMAL HIGH (ref 0.4–1.2)
Glucose, Bld: 83 mg/dL (ref 70–99)

## 2010-08-03 NOTE — Discharge Summary (Signed)
[name], [name] NO.:  [id number]  MEDICAL RECORD NO.:  [id number]           PATIENT TYPE:  I  LOCATION:  1407                         FACILITY[facility]  [facility]  PHYSICIAN:  Verne Carrow, MDDATE OF BIRTH:  Mar 31, 1950  DATE OF ADMISSION:  07/26/2010 DATE OF DISCHARGE:  08/01/2010                              DISCHARGE SUMMARY   PROCEDURES: 1. Two-view chest x-ray. 2. 2-D echocardiogram.  PRIMARY FINAL DISCHARGE DIAGNOSIS:  Acute-on-chronic systolic congestive heart failure.  SECONDARY DIAGNOSES: 1. Chronic kidney disease stage 3 with acute renal insufficiency this     admission. 2. Severe hypothyroidism. 3. Status post echocardiogram May 27, 2010, showing an ejection     fraction of 15% with mild mitral regurgitation and small-to-     moderate pericardial effusion. 4. Remote history of catheterization with nonobstructive coronary     artery disease. 5. Left bundle branch block. 6. Hypertension. 7. Degenerative joint disease and cervical disk disease. 8. Toxic multinodular goiter status post radioactive iodine therapy     and subsequent hypothyroidism. 9. Status post cholecystectomy. 10.Chronic obstructive pulmonary disease. 11.History of hyperglycemia, but with fasting blood sugars this     admission within normal limits. 12.Allergy or intolerance to penicillin. 13.Remote history of tobacco use. 14.Family history of coronary artery disease in multiple relatives.  TIME AT DISCHARGE:  41 minutes.  HOSPITAL COURSE:  [name] is a 60 year old female with a recent diagnosis of left ventricular dysfunction and heart failure.  She was having increasing shortness of breath and came to the hospital where she was admitted for further evaluation.  Her BNP on admission was 8080 and peaked at 9751, but was rechecked prior to discharge and was down to 1249.  Her inputs and outputs were negative by a total of about 4500 cc during her hospital stay.   By discharge her O2 saturation was 97% on room air.  She was diuresed with IV Lasix and her BUN and creatinine were followed closely.  She had been on lisinopril at 20 mg a day prior to admission, but with diuresis her BUN and creatinine increased significantly up to 39/2.03.  Her diuretics were held and then restarted at lower doses.  At discharge her BUN was 29 with a creatinine of 1.38.  Cardiac enzymes were cycled and were negative.  A lipid profile showed an HDL of 51 and an LDL of 104.  TSH was checked and was elevated at 91.098.  A free T4 was checked as well as a free T3.  The free T3 was low at 1.4 and the free T4 was low at 0.55.  Dr. Everardo All was contacted and recommended increasing her Synthroid to 150 mcg daily, which was done.  She will follow up as an outpatient.  As her cardiac status improved, her lisinopril was restarted.  She was put on spirolactone at 50 mg a day as well.  The lisinopril and the Coreg were decreased.  She had been on potassium supplementation, but this will be discontinued at discharge as it has not been necessary over the last several days.  On Aug 01, 2010, her renal function had felt  stabilized and her ACE inhibitor was restarted at a lower dose.  She was ambulating without chest pain or shortness of breath and considered stable for discharge, to follow up as an outpatient.  DISCHARGE INSTRUCTIONS:  Her activity level is to be increased gradually.  She is encouraged to stick to a low-sodium, heart-healthy diet.  She is to get a BMET at her office visit with Tereso Newcomer, [facility] for Dr. Jens Som on May 29 at 3:30.  She is to follow up with Dr. Jonny Ruiz as needed.  DISCHARGE MEDICATIONS: 1. Symbicort two puffs b.i.d. 2. Lisinopril 20 mg one-half tablet daily. 3. Spiriva daily. 4. Coreg 6.25 mg one-half tablet b.i.d. 5. Lasix 40 mg a day. 6. Aldactone 50 mg daily. 7. Aspirin 81 mg a day. 8. Potassium was discontinued. 9. Allegra 180 mg  daily. 10.Albuterol inhaler p.r.n. 11.Synthroid 150 mcg one tablet daily. 12.Vitamin D3 1000 units daily.     Theodore Demark, PA-C   ______________________________ Verne Carrow, MD    RB/MEDQ  D:  08/01/2010  T:  08/01/2010  Job:  [id number]  cc:   Corwin Levins, MD [address] [place] [address]  Electronically Signed by Theodore Demark PA-C on 08/03/2010 11:37:53 AM Electronically Signed by Verne Carrow MD on 08/03/2010 03:32:55 PM

## 2010-08-08 ENCOUNTER — Encounter: Payer: BC Managed Care – PPO | Admitting: Physician Assistant

## 2010-08-08 ENCOUNTER — Other Ambulatory Visit: Payer: BC Managed Care – PPO | Admitting: *Deleted

## 2010-08-10 ENCOUNTER — Encounter: Payer: Self-pay | Admitting: Cardiology

## 2010-08-15 ENCOUNTER — Ambulatory Visit (INDEPENDENT_AMBULATORY_CARE_PROVIDER_SITE_OTHER): Payer: BC Managed Care – PPO | Admitting: Physician Assistant

## 2010-08-15 ENCOUNTER — Encounter: Payer: Self-pay | Admitting: Physician Assistant

## 2010-08-15 VITALS — BP 100/60 | HR 73 | Ht 65.0 in | Wt 132.0 lb

## 2010-08-15 DIAGNOSIS — R079 Chest pain, unspecified: Secondary | ICD-10-CM

## 2010-08-15 DIAGNOSIS — I428 Other cardiomyopathies: Secondary | ICD-10-CM

## 2010-08-15 DIAGNOSIS — E039 Hypothyroidism, unspecified: Secondary | ICD-10-CM

## 2010-08-15 DIAGNOSIS — I5022 Chronic systolic (congestive) heart failure: Secondary | ICD-10-CM

## 2010-08-15 LAB — BASIC METABOLIC PANEL
Chloride: 106 mEq/L (ref 96–112)
GFR: 54.37 mL/min — ABNORMAL LOW (ref >60.00)
Potassium: 4.3 mEq/L (ref 3.5–5.1)
Sodium: 139 mEq/L (ref 135–145)

## 2010-08-15 MED ORDER — FUROSEMIDE 40 MG PO TABS: 40.0000 mg | ORAL_TABLET | Freq: Every day | ORAL | Status: DC

## 2010-08-15 NOTE — Assessment & Plan Note (Signed)
Continue ACE inhibitor, beta blocker, Spironolactone.  Check basic metabolic panel today and repeat in one week.

## 2010-08-15 NOTE — Progress Notes (Signed)
History of Present Illness: Primary Cardiologist: Dr. Olga Millers  [name] [name] is a 60 y.o. female with a history of dilated cardiomyopathy with an ejection fraction of 15% and chronic systolic heart failure.  I reviewed her chart.  She was in the hospital in March with severe hypothyroidism and congestive heart failure.  There is no discharge summary.  The notes indicates that her thyroid needed treatment initially with plans for considering a cardiac catheterization in the future.  She has seen Dr. Jens Som back in followup.  The plan is to repeat an echo in a few months to see if her ejection fraction has recovered; if not she will need a cath.    She was admitted 5/16-5/22 with acute on chronic systolic heart failure.  She was diuresed.  Her creatinine did increase with diuresis and her medications had to be held for a couple of days.  She was eventually placed back on Lasix, ACE inhibitor at discharge.  Spironolactone was also added to her medical regimen and potassium was discontinued.  Her TSH was significantly elevated at 91.098.  Dr. Everardo All was contacted and her Synthroid was increased to 150 mcg daily.  Discharge creatinine was 1.38, potassium 4.5, hemoglobin 11.9, BNP 1000 249.  Her discharge weight was 130 pounds.  She weighs 132 pounds today.  She presents for followup today and notes that she's had increased cough over the last couple of days as well as increased shortness of breath.  She reports 3 pillow orthopnea and class III symptoms.  She did have PND last night.  She denies pedal edema.  Her weight at home and been stable.  She has some left-sided chest discomfort.  She seems to notice this when she is developing increased fluid.  It seems to resolve as her fluid status is improved.  She denies syncope.  Past Medical History  Diagnosis Date  . Hypertension   . Allergic rhinitis   . Genital warts     hx  . Anemia, iron deficiency   . Blood transfusion complicating pregnancy      after first child at 58 yo  . DJD (degenerative joint disease)     hands  . Cervical disc disease     with recurrent radicular pain, Dr. Montez Morita  . COPD (chronic obstructive pulmonary disease) 06/14/2010  . ALLERGIC RHINITIS 03/17/2008  . HYPERTENSION 03/17/2008  . ANEMIA-IRON DEFICIENCY 03/17/2008  . GOITER, MULTINODULAR 12/23/2009  . Cardiomyopathy     a. remote h/o cath with reported nonobs CAD; b. echo 5/12: EF 15%, mild LVH, mild MR, LAE, mod pericardial effusion with increased filling pressures  . Systolic CHF   . Hypothyroid     Current Outpatient Prescriptions  Medication Sig Dispense Refill  . albuterol (PROVENTIL,VENTOLIN) 90 MCG/ACT inhaler Inhale 2 puffs into the lungs every 4 (four) hours as needed.        Marland Kitchen aspirin 81 MG EC tablet Take 81 mg by mouth daily.        . budesonide-formoterol (SYMBICORT) 160-4.5 MCG/ACT inhaler Inhale 2 puffs into the lungs 2 (two) times daily.        . carvedilol (COREG) 6.25 MG tablet Take 1 tablet (6.25 mg total) by mouth 2 (two) times daily.  60 tablet  12  . Cholecalciferol (VITAMIN D3) 1000 UNITS CAPS Take by mouth daily.        . fexofenadine (ALLEGRA) 180 MG tablet Take 180 mg by mouth daily.        . furosemide (  LASIX) 40 MG tablet Take 40 mg by mouth daily.        Marland Kitchen levothyroxine (SYNTHROID, LEVOTHROID) 50 MCG tablet Take 50 mcg by mouth daily.        Marland Kitchen lisinopril (PRINIVIL,ZESTRIL) 20 MG tablet Take 20 mg by mouth daily.        Marland Kitchen DISCONTD: potassium chloride (KLOR-CON) 20 MEQ packet Take 20 mEq by mouth daily.        Marland Kitchen DISCONTD: HYDROcodone-homatropine (HYCODAN) 5-1.5 MG/5ML syrup Take 5 mLs by mouth every 6 (six) hours as needed.        Marland Kitchen DISCONTD: levofloxacin (LEVAQUIN) 500 MG tablet Take 500 mg by mouth daily.          Allergies: Allergies  Allergen Reactions  . Penicillins     REACTION: rash    Vital Signs: BP 100/60  Pulse 73  Ht 5\' 5"  (1.651 m)  Wt 132 lb (59.875 kg)  BMI 21.97 kg/m2  PHYSICAL EXAM: Well nourished,  well developed, in no acute distress HEENT: normal Neck: JVP 5-6 cm Cardiac:  normal S1, S2; RRR; no murmur; Positive S3 Lungs: Decreased breath sounds bilaterally, no wheezing, rhonchi or rales Abd: soft, nontender, no hepatomegaly Ext: no edema Skin: warm and dry Neuro:  CNs 2-12 intact, no focal abnormalities noted  EKG:  Sinus rhythm, heart rate 73, left bundle branch block  ASSESSMENT AND PLAN:

## 2010-08-15 NOTE — Assessment & Plan Note (Signed)
She will need close follow up with endocrinology.  She admits to compliance with her Synthroid.

## 2010-08-15 NOTE — Assessment & Plan Note (Addendum)
She actually ran out of her Lasix 2 days ago.  Her increased cough and shortness of breath began about 2 days ago.  She also has chest discomfort associated with volume overload.  I refilled her Lasix for her today.  She will take 80 mg today and then resume 40 mg a day tomorrow.  She will have a basic metabolic panel today and a repeat basic metabolic panel in one week.  We will contact her in 3 days to see if she is improving.  If she is back to baseline, she will follow up with Dr. Jens Som as scheduled on 6/18.  If she is not showing any signs of significant improvement, she'll be brought back in for earlier follow up.  I believe that she is to have follow up echocardiography in the next couple of months to see if she's had recovery in her LV function.  She has had some difficulty with renal insufficiency in the past.  Consider nuclear perfusion study versus cardiac catheterization in the future to assess for ischemic heart disease.

## 2010-08-15 NOTE — Patient Instructions (Signed)
Your physician recommends that you schedule a follow-up appointment in: 08/28/10 @ 12:15 PM WITH DR. CRENSHAW  Your physician recommends that you schedule a follow-up appointment in: WITH DR. Everardo All FOR HYPOTHRYOIDISM AS PER SCOTT WEAVER, PAC.  Your physician recommends that you return for lab work in: TODAY BMET [phone]  Your physician recommends that you return for lab work in: [employer] IN 1 WEEK   Your physician has recommended you make the following change in your medication: TODAY 08/15/10 TAKE 80 MG OF LASIX WHEN YOU GET HOME; STARTING 08/16/10 TAKE 40 MG OF LASIX ONCE DAILY.

## 2010-08-15 NOTE — Assessment & Plan Note (Signed)
She has had evidence of pericardial effusion on 2 echocardiograms now.  She's had some pleuritic chest pain.  This seems to improve when her fluid status is improved.  As noted, I will get her back on her Lasix initially.  Dr. Jens Som has noted in the past that we could consider starting prednisone if need be.  She's not a candidate for nonsteroidals given her renal insufficiency.  Restart lasix first.  If needed, we can consider prednisone.

## 2010-08-18 ENCOUNTER — Telehealth: Payer: Self-pay | Admitting: *Deleted

## 2010-08-18 NOTE — Telephone Encounter (Signed)
See phone note

## 2010-08-21 ENCOUNTER — Telehealth: Payer: Self-pay | Admitting: *Deleted

## 2010-08-21 NOTE — Telephone Encounter (Signed)
See phone note

## 2010-08-22 ENCOUNTER — Other Ambulatory Visit: Payer: BC Managed Care – PPO | Admitting: *Deleted

## 2010-08-28 ENCOUNTER — Encounter: Payer: Self-pay | Admitting: *Deleted

## 2010-08-28 ENCOUNTER — Encounter: Payer: Self-pay | Admitting: Cardiology

## 2010-08-28 ENCOUNTER — Ambulatory Visit (INDEPENDENT_AMBULATORY_CARE_PROVIDER_SITE_OTHER): Payer: BC Managed Care – PPO | Admitting: Cardiology

## 2010-08-28 DIAGNOSIS — I428 Other cardiomyopathies: Secondary | ICD-10-CM

## 2010-08-28 DIAGNOSIS — I5022 Chronic systolic (congestive) heart failure: Secondary | ICD-10-CM

## 2010-08-28 DIAGNOSIS — E039 Hypothyroidism, unspecified: Secondary | ICD-10-CM

## 2010-08-28 LAB — BASIC METABOLIC PANEL
CO2: 31 mEq/L (ref 19–32)
Glucose, Bld: 83 mg/dL (ref 70–99)
Potassium: 4.2 mEq/L (ref 3.5–5.1)
Sodium: 141 mEq/L (ref 135–145)

## 2010-08-28 NOTE — Assessment & Plan Note (Signed)
Continue present medications. Blood pressure will not allow further increase in medications. Check potassium and renal function.

## 2010-08-28 NOTE — Progress Notes (Signed)
HPI: Pleasant female for followup of congestive heart failure. Admitted in March of 2012 with complaints of dyspnea. BNP elevated at 904. Initial BUN and creatinine 18 and 1.46. TSH elevated at 81. Echocardiogram in March of 2012 showed an ejection fraction of 10-15%, mild mitral regurgitation and a small pericardial effusion. Patient also treated for COPD. CM felt secondary to hypothyroid. Readmitted in May of 2012 with CHF. Patient now states that there is dyspnea on exertion is much improved. It occurs but is less severe. There is mild orthopnea but no PND or pedal edema. Her chest pain has resolved. No syncope.   Current Outpatient Prescriptions  Medication Sig Dispense Refill  . albuterol (PROVENTIL,VENTOLIN) 90 MCG/ACT inhaler Inhale 2 puffs into the lungs every 4 (four) hours as needed.        Marland Kitchen aspirin 81 MG EC tablet Take 81 mg by mouth daily.        . budesonide-formoterol (SYMBICORT) 160-4.5 MCG/ACT inhaler Inhale 2 puffs into the lungs 2 (two) times daily.        . carvedilol (COREG) 6.25 MG tablet 1/2 tab po bid       . Cholecalciferol (VITAMIN D3) 1000 UNITS CAPS Take by mouth daily.        . fexofenadine (ALLEGRA) 180 MG tablet Take 180 mg by mouth daily.        . furosemide (LASIX) 40 MG tablet Take 1 tablet (40 mg total) by mouth daily. TODAY ONLY 08/15/10 TAKE 80 MG START 08/16/10 TAKING 40 MG DAILY  30 tablet  11  . levothyroxine (SYNTHROID, LEVOTHROID) 50 MCG tablet Take 50 mcg by mouth daily.        Marland Kitchen lisinopril (PRINIVIL,ZESTRIL) 20 MG tablet Take 20 mg by mouth daily.        Marland Kitchen spironolactone (ALDACTONE) 50 MG tablet Take 50 mg by mouth daily.        Marland Kitchen DISCONTD: carvedilol (COREG) 6.25 MG tablet Take 1 tablet (6.25 mg total) by mouth 2 (two) times daily.  60 tablet  12     Past Medical History  Diagnosis Date  . Hypertension   . Genital warts     hx  . Anemia, iron deficiency   . Blood transfusion complicating pregnancy     after first child at 4 yo  . DJD (degenerative  joint disease)     hands  . Cervical disc disease     with recurrent radicular pain, Dr. Montez Morita  . COPD (chronic obstructive pulmonary disease) 06/14/2010  . ALLERGIC RHINITIS 03/17/2008  . GOITER, MULTINODULAR 12/23/2009  . Cardiomyopathy     a. remote h/o cath with reported nonobs CAD; b. echo 5/12: EF 15%, mild LVH, mild MR, LAE, mod pericardial effusion with increased filling pressures  . Systolic CHF   . Hypothyroid     Past Surgical History  Procedure Date  . Cholecystectomy   . Cardiac catheterization 1995    with "timy blockage" [facility] Dr. Daisy Floro    History   Social History  . Marital Status: Married    Spouse Name: N/A    Number of Children: 3  . Years of Education: N/A   Occupational History  .     Social History Main Topics  . Smoking status: Former Games developer  . Smokeless tobacco: Not on file   Comment: very light, occasional stopped she states now  . Alcohol Use: No  . Drug Use: No  . Sexually Active: Not on file   Other Topics Concern  .  Not on file   Social History Narrative  . No narrative on file    ROS: no fevers or chills, productive cough, hemoptysis, dysphasia, odynophagia, melena, hematochezia, dysuria, hematuria, rash, seizure activity, orthopnea, PND, pedal edema, claudication. Remaining systems are negative.  Physical Exam: Well-developed well-nourished in no acute distress.  Skin is warm and dry.  HEENT is normal.  Neck is supple. No thyromegaly.  Chest is clear to auscultation with normal expansion.  Cardiovascular exam is regular rate and rhythm.  Abdominal exam nontender or distended. No masses palpated. Extremities show no edema. neuro grossly intact

## 2010-08-28 NOTE — Assessment & Plan Note (Signed)
Continue Synthroid. Managed by primary care.

## 2010-08-28 NOTE — Patient Instructions (Signed)
Your physician recommends that you schedule a follow-up appointment in: 8 WEEKS  Your physician has requested that you have an echocardiogram. Echocardiography is a painless test that uses sound waves to create images of your heart. It provides your doctor with information about the size and shape of your heart and how well your heart's chambers and valves are working. This procedure takes approximately one hour. There are no restrictions for this procedure.

## 2010-08-28 NOTE — Assessment & Plan Note (Signed)
Cardiomyopathy is felt most likely secondary to hypothyroidism. She has now been on Synthroid since March. Plan repeat echocardiogram. If LV function is improving plan medical therapy including Synthroid. If no significant change she may require ischemia evaluation to include Myoview versus catheterization. Continue present cardiac medications.

## 2010-08-29 ENCOUNTER — Encounter: Payer: Self-pay | Admitting: *Deleted

## 2010-08-30 ENCOUNTER — Other Ambulatory Visit (INDEPENDENT_AMBULATORY_CARE_PROVIDER_SITE_OTHER): Payer: BC Managed Care – PPO

## 2010-08-30 ENCOUNTER — Ambulatory Visit (INDEPENDENT_AMBULATORY_CARE_PROVIDER_SITE_OTHER): Payer: BC Managed Care – PPO | Admitting: Endocrinology

## 2010-08-30 ENCOUNTER — Encounter: Payer: Self-pay | Admitting: Endocrinology

## 2010-08-30 DIAGNOSIS — E039 Hypothyroidism, unspecified: Secondary | ICD-10-CM

## 2010-08-30 DIAGNOSIS — E890 Postprocedural hypothyroidism: Secondary | ICD-10-CM

## 2010-08-30 LAB — TSH: TSH: 2.06 u[IU]/mL (ref 0.35–5.50)

## 2010-08-30 NOTE — Progress Notes (Signed)
Subjective:    Patient ID: [name], female    DOB: [birth year], 60 y.o.   MRN: [id number]  HPI Pt had i-131 rx for hyperthyroidism in late 2011, and did not ret for f/u.  She was recently hospitalized with chf.  She feels much better now. Pt state few weeks of moderate congestion in the nose, and slight assoc prod-quality cough.  abx did not help. Past Medical History  Diagnosis Date  . Hypertension   . Genital warts     hx  . Anemia, iron deficiency   . Blood transfusion complicating pregnancy     after first child at 46 yo  . DJD (degenerative joint disease)     hands  . Cervical disc disease     with recurrent radicular pain, Dr. Montez Morita  . COPD (chronic obstructive pulmonary disease) 06/14/2010  . ALLERGIC RHINITIS 03/17/2008  . GOITER, MULTINODULAR 12/23/2009  . Cardiomyopathy     a. remote h/o cath with reported nonobs CAD; b. echo 5/12: EF 15%, mild LVH, mild MR, LAE, mod pericardial effusion with increased filling pressures  . Systolic CHF   . Hypothyroid     Past Surgical History  Procedure Date  . Cholecystectomy   . Cardiac catheterization 1995    with "timy blockage" [facility] Dr. Daisy Floro    History   Social History  . Marital Status: Married    Spouse Name: N/A    Number of Children: 3  . Years of Education: N/A   Occupational History  .     Social History Main Topics  . Smoking status: Former Games developer  . Smokeless tobacco: Not on file   Comment: very light, occasional stopped she states now  . Alcohol Use: No  . Drug Use: No  . Sexually Active: Not on file   Other Topics Concern  . Not on file   Social History Narrative  . No narrative on file    Current Outpatient Prescriptions on File Prior to Visit  Medication Sig Dispense Refill  . albuterol (PROVENTIL,VENTOLIN) 90 MCG/ACT inhaler Inhale 2 puffs into the lungs every 4 (four) hours as needed.        Marland Kitchen aspirin 81 MG EC tablet Take 81 mg by mouth daily.        . budesonide-formoterol  (SYMBICORT) 160-4.5 MCG/ACT inhaler Inhale 2 puffs into the lungs 2 (two) times daily.        . carvedilol (COREG) 6.25 MG tablet 1/2 tab po bid       . Cholecalciferol (VITAMIN D3) 1000 UNITS CAPS Take by mouth daily.        . fexofenadine (ALLEGRA) 180 MG tablet Take 180 mg by mouth daily.        . furosemide (LASIX) 40 MG tablet Take 1 tablet (40 mg total) by mouth daily. TODAY ONLY 08/15/10 TAKE 80 MG START 08/16/10 TAKING 40 MG DAILY  30 tablet  11  . lisinopril (PRINIVIL,ZESTRIL) 20 MG tablet 1/2 tablet by mouth daily      . spironolactone (ALDACTONE) 50 MG tablet Take 50 mg by mouth daily.        Marland Kitchen DISCONTD: levothyroxine (SYNTHROID, LEVOTHROID) 50 MCG tablet Take 50 mcg by mouth daily.          Allergies  Allergen Reactions  . Latex     Allergic to latex gloves  . Penicillins     REACTION: rash    Family History  Problem Relation Age of Onset  . Stroke Mother   .  Heart disease Mother   . Hypertension Mother   . Diabetes Mother   . Stroke Father   . Heart disease Father   . Hypertension Father   . Diabetes Father   . Cancer Sister     colon  . Heart disease Sister     pacemaker  . Hypertension Sister   . Depression Sister   . Diabetes Sister   . Thyroid disease Sister     uncertain type  . Transient ischemic attack Maternal Grandfather   . Alcohol abuse Other     BP 102/62  Pulse 67  Temp(Src) 98.4 F (36.9 C) (Oral)  Ht 5\' 5"  (1.651 m)  Wt 130 lb 6.4 oz (59.149 kg)  BMI 21.70 kg/m2  SpO2 97%  Review of Systems Denies weight change and fever.     Objective:   Physical Exam GENERAL: no distress Neck:  No goiter.  The left thyroid nodule is non-palpable today. 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

## 2010-08-30 NOTE — Patient Instructions (Addendum)
blood tests are being ordered for you today.  please call [phone] to hear your test results.  You will be prompted to enter the 9-digit "MRN" number that appears at the top left of this page, followed by #.  Then you will hear the message. Please make a follow-up appointment in 2 months.  Here are some samples of "veramist" nasal spray.  Take 1 spray each side daily.  Call if this helps, so i can prescribe for you a similar generic.  Please allow this approx 5 days to work. (update: i left message on phone-tree:  Same synthroid.)

## 2010-09-06 ENCOUNTER — Ambulatory Visit (HOSPITAL_COMMUNITY): Payer: BC Managed Care – PPO | Attending: Cardiology | Admitting: Radiology

## 2010-09-06 DIAGNOSIS — I059 Rheumatic mitral valve disease, unspecified: Secondary | ICD-10-CM | POA: Insufficient documentation

## 2010-09-06 DIAGNOSIS — J449 Chronic obstructive pulmonary disease, unspecified: Secondary | ICD-10-CM | POA: Insufficient documentation

## 2010-09-06 DIAGNOSIS — I509 Heart failure, unspecified: Secondary | ICD-10-CM | POA: Insufficient documentation

## 2010-09-06 DIAGNOSIS — J4489 Other specified chronic obstructive pulmonary disease: Secondary | ICD-10-CM | POA: Insufficient documentation

## 2010-09-06 DIAGNOSIS — I319 Disease of pericardium, unspecified: Secondary | ICD-10-CM | POA: Insufficient documentation

## 2010-09-06 DIAGNOSIS — I079 Rheumatic tricuspid valve disease, unspecified: Secondary | ICD-10-CM | POA: Insufficient documentation

## 2010-09-06 DIAGNOSIS — I5022 Chronic systolic (congestive) heart failure: Secondary | ICD-10-CM

## 2010-09-06 DIAGNOSIS — I428 Other cardiomyopathies: Secondary | ICD-10-CM

## 2010-10-24 ENCOUNTER — Ambulatory Visit (INDEPENDENT_AMBULATORY_CARE_PROVIDER_SITE_OTHER)
Admission: RE | Admit: 2010-10-24 | Discharge: 2010-10-24 | Disposition: A | Discharge status: Home / Self Care | Payer: BC Managed Care – PPO | Source: Ambulatory Visit | Attending: Cardiology | Admitting: Cardiology

## 2010-10-24 ENCOUNTER — Encounter: Payer: Self-pay | Admitting: *Deleted

## 2010-10-24 ENCOUNTER — Ambulatory Visit (INDEPENDENT_AMBULATORY_CARE_PROVIDER_SITE_OTHER): Payer: BC Managed Care – PPO | Admitting: Cardiology

## 2010-10-24 ENCOUNTER — Encounter: Payer: Self-pay | Admitting: Cardiology

## 2010-10-24 DIAGNOSIS — I10 Essential (primary) hypertension: Secondary | ICD-10-CM

## 2010-10-24 DIAGNOSIS — Z01810 Encounter for preprocedural cardiovascular examination: Secondary | ICD-10-CM

## 2010-10-24 DIAGNOSIS — I428 Other cardiomyopathies: Secondary | ICD-10-CM

## 2010-10-24 DIAGNOSIS — I5022 Chronic systolic (congestive) heart failure: Secondary | ICD-10-CM

## 2010-10-24 DIAGNOSIS — Z79899 Other long term (current) drug therapy: Secondary | ICD-10-CM

## 2010-10-24 LAB — BASIC METABOLIC PANEL
Calcium: 9.7 mg/dL (ref 8.4–10.5)
GFR: 57.98 mL/min — ABNORMAL LOW (ref >60.00)
Glucose, Bld: 100 mg/dL — ABNORMAL HIGH (ref 70–99)
Potassium: 4.2 mEq/L (ref 3.5–5.1)
Sodium: 140 mEq/L (ref 135–145)

## 2010-10-24 LAB — CBC WITH DIFFERENTIAL/PLATELET
Basophils Absolute: 0 10*3/uL (ref 0.0–0.1)
Eosinophils Absolute: 0.1 10*3/uL (ref 0.0–0.7)
Hemoglobin: 13.5 g/dL (ref 12.0–15.0)
Lymphocytes Relative: 33.4 % (ref 12.0–46.0)
Monocytes Relative: 7.8 % (ref 3.0–12.0)
Neutro Abs: 2.1 10*3/uL (ref 1.4–7.7)
Neutrophils Relative %: 54.3 % (ref 43.0–77.0)
RDW: 14.9 % — ABNORMAL HIGH (ref 11.5–14.6)

## 2010-10-24 LAB — PROTIME-INR
INR: 1 ratio (ref 0.8–1.0)
Prothrombin Time: 11.6 s (ref 10.2–12.4)

## 2010-10-24 LAB — BRAIN NATRIURETIC PEPTIDE: Pro B Natriuretic peptide (BNP): 152 pg/mL — ABNORMAL HIGH (ref 0.0–100.0)

## 2010-10-24 NOTE — Patient Instructions (Signed)
Your physician recommends that you schedule a follow-up appointment in: 4 WEEKS  Your physician has requested that you have a cardiac catheterization. Cardiac catheterization is used to diagnose and/or treat various heart conditions. Doctors may recommend this procedure for a number of different reasons. The most common reason is to evaluate chest pain. Chest pain can be a symptom of coronary artery disease (CAD), and cardiac catheterization can show whether plaque is narrowing or blocking your heart's arteries. This procedure is also used to evaluate the valves, as well as measure the blood flow and oxygen levels in different parts of your heart. For further information please visit www.cardiosmart.org. Please follow instruction sheet, as given.   A chest x-ray takes a picture of the organs and structures inside the chest, including the heart, lungs, and blood vessels. This test can show several things, including, whether the heart is enlarges; whether fluid is building up in the lungs; and whether pacemaker / defibrillator leads are still in place. AT [address]   

## 2010-10-24 NOTE — Assessment & Plan Note (Signed)
Blood pressure controlled. Continue present medications. 

## 2010-10-24 NOTE — Assessment & Plan Note (Signed)
Etiology unclear. Her LV function has not improved with the addition of Synthroid for her hypothyroidism. Plan proceed with cardiac catheterization to exclude coronary disease. If negative we will refer to electrophysiology for consideration of ICD. Continue ACE inhibitor and beta blocker. I cannot advance the dose as her blood pressure is borderline.

## 2010-10-24 NOTE — Progress Notes (Signed)
HPI: Pleasant female for followup of congestive heart failure. Admitted in March of 2012 with complaints of dyspnea. BNP elevated at 904. Initial BUN and creatinine 18 and 1.46. TSH elevated at 81. Echocardiogram in March of 2012 showed an ejection fraction of 10-15%, mild mitral regurgitation and a small pericardial effusion. Patient also treated for COPD. CM felt secondary to hypothyroid. Readmitted in May of 2012 with CHF. Repeat echo in June of 2012 showed severe LV dysfunction with EF 10-15, mild LAE, mild MR, trivial pericardial effusion. Since I last saw her in June of 2012, the patient has dyspnea with  activities. It is relieved with rest. It is not associated with chest pain. There is no PND or pedal edema but there is orthopnea. There is no syncope or palpitations. There is no exertional chest pain.    Current Outpatient Prescriptions  Medication Sig Dispense Refill  . albuterol (PROVENTIL,VENTOLIN) 90 MCG/ACT inhaler Inhale 2 puffs into the lungs every 4 (four) hours as needed.        Marland Kitchen aspirin 81 MG EC tablet Take 81 mg by mouth daily.        . budesonide-formoterol (SYMBICORT) 160-4.5 MCG/ACT inhaler Inhale 2 puffs into the lungs 2 (two) times daily.        . carvedilol (COREG) 6.25 MG tablet 1/2 tab po bid       . Cholecalciferol (VITAMIN D3) 1000 UNITS CAPS Take by mouth daily.        . fexofenadine (ALLEGRA) 180 MG tablet Take 180 mg by mouth daily.        . fluticasone (VERAMYST) 27.5 MCG/SPRAY nasal spray Place 2 sprays into the nose daily.        . furosemide (LASIX) 40 MG tablet Take 20 mg by mouth daily.        Marland Kitchen levothyroxine (SYNTHROID, LEVOTHROID) 150 MCG tablet Take 150 mcg by mouth daily.        Marland Kitchen lisinopril (PRINIVIL,ZESTRIL) 20 MG tablet 1/2 tablet by mouth daily      . spironolactone (ALDACTONE) 50 MG tablet Take 50 mg by mouth daily.        Marland Kitchen tiotropium (SPIRIVA) 18 MCG inhalation capsule Place 18 mcg into inhaler and inhale daily.           Past Medical History    Diagnosis Date  . Hypertension   . Genital warts     hx  . Anemia, iron deficiency   . Blood transfusion complicating pregnancy     after first child at 15 yo  . DJD (degenerative joint disease)     hands  . Cervical disc disease     with recurrent radicular pain, Dr. Montez Morita  . COPD (chronic obstructive pulmonary disease) 06/14/2010  . ALLERGIC RHINITIS 03/17/2008  . GOITER, MULTINODULAR 12/23/2009  . Cardiomyopathy     a. remote h/o cath with reported nonobs CAD; b. echo 5/12: EF 15%, mild LVH, mild MR, LAE, mod pericardial effusion with increased filling pressures  . Systolic CHF   . Hypothyroid     Past Surgical History  Procedure Date  . Cholecystectomy   . Cardiac catheterization 1995    with "timy blockage" [facility] Dr. Daisy Floro    History   Social History  . Marital Status: Married    Spouse Name: N/A    Number of Children: 3  . Years of Education: N/A   Occupational History  .     Social History Main Topics  . Smoking status: Former Games developer  .  Smokeless tobacco: Not on file   Comment: very light, occasional stopped she states now  . Alcohol Use: No  . Drug Use: No  . Sexually Active: Not on file   Other Topics Concern  . Not on file   Social History Narrative  . No narrative on file    ROS: no fevers or chills, productive cough, hemoptysis, dysphasia, odynophagia, melena, hematochezia, dysuria, hematuria, rash, seizure activity, orthopnea, PND, pedal edema, claudication. Remaining systems are negative.  Physical Exam: Well-developed well-nourished in no acute distress.  Skin is warm and dry.  HEENT is normal.  Neck is supple. No thyromegaly.  Chest is clear to auscultation with normal expansion.  Cardiovascular exam is regular rate and rhythm.  Abdominal exam nontender or distended. No masses palpated. Extremities show no edema. neuro grossly intact

## 2010-10-24 NOTE — Assessment & Plan Note (Signed)
Continue present dose of diuretic. Check BNP.

## 2010-10-25 ENCOUNTER — Inpatient Hospital Stay (HOSPITAL_BASED_OUTPATIENT_CLINIC_OR_DEPARTMENT_OTHER)
Admission: RE | Admit: 2010-10-25 | Discharge: 2010-10-25 | Disposition: A | Discharge status: Home / Self Care | Payer: BC Managed Care – PPO | Source: Ambulatory Visit | Attending: Cardiovascular Disease | Admitting: Cardiovascular Disease

## 2010-10-25 ENCOUNTER — Ambulatory Visit (HOSPITAL_COMMUNITY)
Admission: RE | Admit: 2010-10-25 | Payer: BC Managed Care – PPO | Source: Ambulatory Visit | Admitting: Cardiovascular Disease

## 2010-10-25 DIAGNOSIS — I5020 Unspecified systolic (congestive) heart failure: Secondary | ICD-10-CM | POA: Insufficient documentation

## 2010-10-25 DIAGNOSIS — I2510 Atherosclerotic heart disease of native coronary artery without angina pectoris: Secondary | ICD-10-CM | POA: Insufficient documentation

## 2010-10-25 DIAGNOSIS — I509 Heart failure, unspecified: Secondary | ICD-10-CM

## 2010-11-01 ENCOUNTER — Ambulatory Visit: Payer: BC Managed Care – PPO | Admitting: Endocrinology

## 2010-11-02 ENCOUNTER — Encounter: Payer: Self-pay | Admitting: Endocrinology

## 2010-11-02 ENCOUNTER — Ambulatory Visit (INDEPENDENT_AMBULATORY_CARE_PROVIDER_SITE_OTHER): Payer: BC Managed Care – PPO | Admitting: Endocrinology

## 2010-11-02 ENCOUNTER — Other Ambulatory Visit (INDEPENDENT_AMBULATORY_CARE_PROVIDER_SITE_OTHER): Payer: BC Managed Care – PPO

## 2010-11-02 VITALS — BP 90/62 | HR 66 | Temp 97.7°F | Ht 65.0 in | Wt 129.8 lb

## 2010-11-02 DIAGNOSIS — E042 Nontoxic multinodular goiter: Secondary | ICD-10-CM

## 2010-11-02 DIAGNOSIS — E890 Postprocedural hypothyroidism: Secondary | ICD-10-CM

## 2010-11-02 MED ORDER — TRIAMCINOLONE ACETONIDE 0.025 % EX CREA: TOPICAL_CREAM | Freq: Four times a day (QID) | CUTANEOUS | Status: DC

## 2010-11-02 NOTE — Patient Instructions (Addendum)
blood tests are being ordered for you today.  please call [phone] to hear each of your test results.  You will be prompted to enter the 9-digit "MRN" number that appears at the top left of this page, followed by #.  Then you will hear the message. Please make a follow-up appointment in 6 months. Let's recheck the thyroid ultrasound.  you will receive a phone call, about a day and time for an appointment. i have sent a prescription to your pharmacy, for an anti-itch skin cream. I hope you feel better soon.  If your rash is not better by next week, please call doctor Jonny Ruiz. (update: i left message on phone-tree:  rx as we discussed)

## 2010-11-02 NOTE — Progress Notes (Signed)
Subjective:    Patient ID: [name], female    DOB: 03-04-1951, 60 y.o.   MRN: [id number]  HPI Pt had i-131 rx for hyperthyroidism in late 2011.  She takes synthroid 150/d, as rx'ed.  She feels better in general. She reports 1 week of moderate itching throughout the body, but worst on the hands. She has an assoc rash. Past Medical History  Diagnosis Date  . Hypertension   . Genital warts     hx  . Anemia, iron deficiency   . Blood transfusion complicating pregnancy     after first child at 20 yo  . DJD (degenerative joint disease)     hands  . Cervical disc disease     with recurrent radicular pain, Dr. Montez Morita  . COPD (chronic obstructive pulmonary disease) 06/14/2010  . ALLERGIC RHINITIS 03/17/2008  . GOITER, MULTINODULAR 12/23/2009  . Cardiomyopathy     a. remote h/o cath with reported nonobs CAD; b. echo 5/12: EF 15%, mild LVH, mild MR, LAE, mod pericardial effusion with increased filling pressures  . Systolic CHF   . Hypothyroid     Past Surgical History  Procedure Date  . Cholecystectomy   . Cardiac catheterization 1995    with "timy blockage" [facility] Dr. Daisy Floro    History   Social History  . Marital Status: Married    Spouse Name: N/A    Number of Children: 3  . Years of Education: N/A   Occupational History  .     Social History Main Topics  . Smoking status: Former Games developer  . Smokeless tobacco: Not on file   Comment: very light, occasional stopped she states now  . Alcohol Use: No  . Drug Use: No  . Sexually Active: Not on file   Other Topics Concern  . Not on file   Social History Narrative  . No narrative on file    Current Outpatient Prescriptions on File Prior to Visit  Medication Sig Dispense Refill  . albuterol (PROVENTIL,VENTOLIN) 90 MCG/ACT inhaler Inhale 2 puffs into the lungs every 4 (four) hours as needed.        Marland Kitchen aspirin 81 MG EC tablet Take 81 mg by mouth daily.        . budesonide-formoterol (SYMBICORT) 160-4.5 MCG/ACT  inhaler Inhale 2 puffs into the lungs 2 (two) times daily.        . carvedilol (COREG) 6.25 MG tablet 1/2 tab po bid       . Cholecalciferol (VITAMIN D3) 1000 UNITS CAPS Take by mouth daily.        . fexofenadine (ALLEGRA) 180 MG tablet Take 180 mg by mouth daily.        . fluticasone (VERAMYST) 27.5 MCG/SPRAY nasal spray Place 2 sprays into the nose daily.        . furosemide (LASIX) 40 MG tablet Take 20 mg by mouth daily.        Marland Kitchen levothyroxine (SYNTHROID, LEVOTHROID) 150 MCG tablet Take 150 mcg by mouth daily.        Marland Kitchen lisinopril (PRINIVIL,ZESTRIL) 20 MG tablet 1/2 tablet by mouth daily      . spironolactone (ALDACTONE) 50 MG tablet Take 50 mg by mouth daily.        Marland Kitchen tiotropium (SPIRIVA) 18 MCG inhalation capsule Place 18 mcg into inhaler and inhale daily.          Allergies  Allergen Reactions  . Latex     Allergic to latex gloves  .  Penicillins     REACTION: rash  . Strawberry     Family History  Problem Relation Age of Onset  . Stroke Mother   . Heart disease Mother   . Hypertension Mother   . Diabetes Mother   . Stroke Father   . Heart disease Father   . Hypertension Father   . Diabetes Father   . Cancer Sister     colon  . Heart disease Sister     pacemaker  . Hypertension Sister   . Depression Sister   . Diabetes Sister   . Thyroid disease Sister     uncertain type  . Transient ischemic attack Maternal Grandfather   . Alcohol abuse Other    BP 90/62  Pulse 66  Temp(Src) 97.7 F (36.5 C) (Oral)  Ht 5\' 5"  (1.651 m)  Wt 129 lb 12.8 oz (58.877 kg)  BMI 21.60 kg/m2  SpO2 99%  Review of Systems Denies fever.  She says the sensation of a fullness in the neck is less now.      Objective:   Physical Exam GENERAL: no distress There is no palpable thyroid enlargement.  No thyroid nodule is palpable.  No palpable lymphadenopathy at the anterior neck.   Skin: slight eczematous rash on the hands.  Lab Results  Component Value Date   TSH 1.42 11/02/2010        Assessment & Plan:  Rash, uncertain etiology.  New.  uncertain etiology Post-i-131 hypothyroidism, well-replaced Hypotension, unchanged since she recently saw dr Jens Som Multinodular goiter, uncertain if there has been any change since i-131 rx

## 2010-11-03 NOTE — Cardiovascular Report (Signed)
NAMEZEYNA, MKRTCHYAN NO.:  [id number]  MEDICAL RECORD NO.:  [id number]  LOCATION:  CATH                         FACILITY:  [facility]  PHYSICIAN:  Lorine Bears, MD     DATE OF BIRTH:  Mar 15, 1950  DATE OF PROCEDURE: DATE OF DISCHARGE:                           CARDIAC CATHETERIZATION   PRIMARY CARDIOLOGIST:  Madolyn Frieze. Jens Som, MD, [facility]  PROCEDURES PERFORMED: 1. Left heart catheterization. 2. Coronary angiography. 3. Left ventricular angiography.  INDICATIONS AND CLINICAL HISTORY:  This is a 60 year old female with recently diagnosed congestive heart failure with severely reduced LV systolic function and ejection fraction of 10-15%.  Initially, it was thought to be due to hypothyroidism, but did not improve after treatment with thyroid replacement therapy.  Thus, she was referred for cardiac catheterization to rule out underlying coronary artery disease and in anticipation of an ICD placement.  Risks, benefits and alternatives were discussed with the patient.  STUDY DETAILS:  After informed consent was obtained, the right groin area was prepped in a sterile fashion.  It was anesthetized with 1% lidocaine.  A 4-French sheath was placed in the right femoral artery after anterior puncture.  Coronary angiography was performed with a JL- 4, 3-DRC and a pigtail catheter.  All catheter exchanges were done over the wire.  The patient tolerated the procedure well with no immediate complications.  The sheath was removed and manual pressure was applied.  STUDY FINDINGS:  Hemodynamic findings:  Aortic pressure is 94/58 with a mean pressure of 72 mmHg.  Left ventricular pressure is 92/13 with a left ventricular end-diastolic pressure of 13 mmHg.  Left ventricular angiography:  This showed severely dilated left ventricle with severely reduced LV systolic function and an ejection fraction of 10-15% without significant mitral regurgitation.  CORONARY ANGIOGRAPHY: 1.  Left main coronary artery:  The vessel is normal in size without     any calcifications.  It is free of any significant disease.     Overall, it is short. 2. Left circumflex artery:  The vessel is normal in size.  Has mild     calcifications and minor atherosclerosis without any obstructive     lesions. 3. Left anterior descending artery:  The vessel is normal in size with     mild calcifications and atherosclerosis.  There is a 20% proximal     and 20% mid disease.  Otherwise, no obstructive lesions.  The     diagonal branches are medium in size and free of significant     disease. 4. Right coronary artery:  The vessel is normal in size with mild     calcifications.  There is 20% diffuse disease proximally and 20%     mid disease.  Otherwise, no obstructive lesions.  STUDY CONCLUSIONS: 1. Mild nonobstructive coronary artery disease. 2. Severely reduced LV systolic function with an estimated ejection     fraction of 10-15%. 3. Near normal left ventricular end-diastolic pressure.  RECOMMENDATIONS:  Recommend medical therapy for heart failure.  An ICD placement is recommended as is being planned.     Lorine Bears, MD     MA/MEDQ  D:  10/25/2010  T:  10/25/2010  Job:  [id number]  cc:   Madolyn Frieze. Jens Som, MD, [facility]  Electronically Signed by Lorine Bears MD on 11/03/2010 03:18:48 PM

## 2010-11-07 ENCOUNTER — Other Ambulatory Visit: Payer: BC Managed Care – PPO

## 2010-11-14 ENCOUNTER — Ambulatory Visit: Payer: BC Managed Care – PPO | Admitting: Physician Assistant

## 2010-11-20 ENCOUNTER — Ambulatory Visit: Payer: BC Managed Care – PPO | Admitting: Physician Assistant

## 2010-11-27 ENCOUNTER — Encounter: Payer: Self-pay | Admitting: Physician Assistant

## 2010-11-27 ENCOUNTER — Ambulatory Visit (INDEPENDENT_AMBULATORY_CARE_PROVIDER_SITE_OTHER): Payer: BC Managed Care – PPO | Admitting: Physician Assistant

## 2010-11-27 VITALS — BP 99/59 | HR 60 | Ht 69.0 in | Wt 130.0 lb

## 2010-11-27 DIAGNOSIS — R079 Chest pain, unspecified: Secondary | ICD-10-CM

## 2010-11-27 DIAGNOSIS — I2510 Atherosclerotic heart disease of native coronary artery without angina pectoris: Secondary | ICD-10-CM | POA: Insufficient documentation

## 2010-11-27 DIAGNOSIS — I5022 Chronic systolic (congestive) heart failure: Secondary | ICD-10-CM

## 2010-11-27 NOTE — Patient Instructions (Signed)
You have been referred to PLEASE MAKE APPOINTMENT WITH ONE OF OUR EP DOCTORS FOR A POSSIBLE ICD IMPLANT PER SCOTT WEAVER, PA-C.  Your physician recommends that you schedule a follow-up appointment in: 3 MONTHS WITH DR. CRENSHAW PER SCOTT WEAVER, PA-C

## 2010-11-27 NOTE — Assessment & Plan Note (Signed)
She has a nonischemic cardiomyopathy.  She is on maximal medical therapy as her blood pressure will not allow any further titration of her medications.  Her thyroid has been corrected.  She continues to have class III symptoms.  She has a left bundle branch block.  As noted previously by Dr. Jens Som, she will be referred to electrophysiology for consideration of ICD.  She may also be a candidate for biventricular pacing given her left bundle branch block and significant symptoms.  I will check a basic metabolic panel and BNP today.  If her BNP is elevated, I will adjust her Lasix.  She will followup with Dr. Jens Som in 3 months.

## 2010-11-27 NOTE — Assessment & Plan Note (Signed)
Suspect this is from acid reflux.  I have recommended she take Prilosec on a daily basis.

## 2010-11-27 NOTE — Progress Notes (Signed)
History of Present Illness: Primary Cardiologist: Dr. Olga Millers  [name] is a 60 y.o. female with a history of dilated cardiomyopathy with an ejection fraction of 15% and chronic systolic heart failure.  She was in the hospital in March with severe hypothyroidism and congestive heart failure.  She was admitted again in 5/12 with a/c systolic CHF.  Her TSH has normalized with treatment of her hypothyroidism.  Followup echocardiogram in June demonstrated continued LV dysfunction with an EF of 15%.  She saw Dr. Jens Som 8/14.  She was set up for cardiac catheterization includes CAD as a cause for her cardiomyopathy.  Cardiac catheterization was performed 8/15: EF 10-15%, circumflex calcified without obstructive CAD, proximal LAD 20%, mid LAD 20%, proximal RCA 20%, mid RCA 20%.  She was felt to have mild monitor to CAD and a nonischemic myopathy.  It was felt that she would need referral to EP for consideration of ICD should she not have CAD.  She is doing well.  She has occasional chest tightness.  This occurs with lying down.  She also has indigestion.  She was given Prilosec by her PCP.  She does not take this consistently.  She has dyspnea with exertion.  She describes NYHA class 3 symptoms.  She sometimes has to awaken at night and sleep in a recliner due to shortness of breath.  She denies syncope or near-syncope.  She occasionally is lightheaded.  She denies palpitations.  Unfortunately, she developed a rash after her cardiac catheterization.  Her endocrinologist gave her triamcinolone cream.  This is resolving.  It is presumed that she had a reaction to the IV dye.  Past Medical History  Diagnosis Date  . Hypertension   . Genital warts     hx  . Anemia, iron deficiency   . Blood transfusion complicating pregnancy     after first child at 67 yo  . DJD (degenerative joint disease)     hands  . Cervical disc disease     with recurrent radicular pain, Dr. Montez Morita  . COPD (chronic  obstructive pulmonary disease) 06/14/2010  . ALLERGIC RHINITIS 03/17/2008  . GOITER, MULTINODULAR 12/23/2009  . Cardiomyopathy     a. remote h/o cath with reported nonobs CAD; b. echo 5/12: EF 15%, mild LVH, mild MR, LAE, mod pericardial effusion with increased filling pressures  . Systolic CHF   . Hypothyroid   . Nonischemic cardiomyopathy   . CAD (coronary artery disease)     Nonobstructive by cardiac catheter 8/12:EF 10-15%, circumflex calcified without obstructive CAD, proximal LAD 20%, mid LAD 20%, proximal RCA 20%, mid RCA 20%.    Current Outpatient Prescriptions  Medication Sig Dispense Refill  . albuterol (PROVENTIL,VENTOLIN) 90 MCG/ACT inhaler Inhale 2 puffs into the lungs every 4 (four) hours as needed.        Marland Kitchen aspirin 81 MG EC tablet Take 81 mg by mouth daily.        . budesonide-formoterol (SYMBICORT) 160-4.5 MCG/ACT inhaler Inhale 2 puffs into the lungs 2 (two) times daily.        . carvedilol (COREG) 6.25 MG tablet 1/2 tab po bid       . Cholecalciferol (VITAMIN D3) 1000 UNITS CAPS Take by mouth daily.        . fexofenadine (ALLEGRA) 180 MG tablet Take 180 mg by mouth daily.        . fluticasone (VERAMYST) 27.5 MCG/SPRAY nasal spray Place 2 sprays into the nose daily.        Marland Kitchen  furosemide (LASIX) 40 MG tablet Take 20 mg by mouth daily.        Marland Kitchen levothyroxine (SYNTHROID, LEVOTHROID) 150 MCG tablet Take 150 mcg by mouth daily.        Marland Kitchen lisinopril (PRINIVIL,ZESTRIL) 20 MG tablet 1/2 tablet by mouth daily      . spironolactone (ALDACTONE) 50 MG tablet Take 50 mg by mouth daily.        Marland Kitchen tiotropium (SPIRIVA) 18 MCG inhalation capsule Place 18 mcg into inhaler and inhale daily.        Marland Kitchen triamcinolone (KENALOG) 0.025 % cream Apply topically 4 (four) times daily. As needed for itching  30 g  1    Allergies: Allergies  Allergen Reactions  . Ivp Dye (Iodinated Diagnostic Agents)   . Latex     Allergic to latex gloves  . Penicillins     REACTION: rash  . Strawberry     Vital  Signs: BP 99/59  Pulse 60  Ht 5\' 9"  (1.753 m)  Wt 130 lb (58.968 kg)  BMI 19.20 kg/m2  PHYSICAL EXAM: Well nourished, well developed, in no acute distress HEENT: normal Neck: No JVD Cardiac:  normal S1, S2; RRR; no murmur; no S3 Lungs: Decreased breath sounds bilaterally, no wheezing, rhonchi or rales Abd: soft, nontender, no hepatomegaly Ext: no edema Skin: warm and dry Neuro:  CNs 2-12 intact, no focal abnormalities noted  ASSESSMENT AND PLAN:

## 2010-11-27 NOTE — Assessment & Plan Note (Signed)
Nonobstructive by cardiac catheterization.  Continue aspirin.  She has a lipid panel pending in the system.

## 2010-12-13 ENCOUNTER — Ambulatory Visit (INDEPENDENT_AMBULATORY_CARE_PROVIDER_SITE_OTHER): Payer: BC Managed Care – PPO | Admitting: Internal Medicine

## 2010-12-13 ENCOUNTER — Other Ambulatory Visit (INDEPENDENT_AMBULATORY_CARE_PROVIDER_SITE_OTHER): Payer: BC Managed Care – PPO

## 2010-12-13 ENCOUNTER — Encounter: Payer: Self-pay | Admitting: Internal Medicine

## 2010-12-13 ENCOUNTER — Ambulatory Visit (INDEPENDENT_AMBULATORY_CARE_PROVIDER_SITE_OTHER)
Admission: RE | Admit: 2010-12-13 | Discharge: 2010-12-13 | Disposition: A | Discharge status: Home / Self Care | Payer: BC Managed Care – PPO | Source: Ambulatory Visit | Attending: Internal Medicine | Admitting: Internal Medicine

## 2010-12-13 VITALS — BP 80/58 | HR 83 | Temp 97.5°F | Ht 65.0 in | Wt 127.0 lb

## 2010-12-13 DIAGNOSIS — Z0000 Encounter for general adult medical examination without abnormal findings: Secondary | ICD-10-CM

## 2010-12-13 DIAGNOSIS — J209 Acute bronchitis, unspecified: Secondary | ICD-10-CM

## 2010-12-13 DIAGNOSIS — I5022 Chronic systolic (congestive) heart failure: Secondary | ICD-10-CM

## 2010-12-13 DIAGNOSIS — J441 Chronic obstructive pulmonary disease with (acute) exacerbation: Secondary | ICD-10-CM

## 2010-12-13 DIAGNOSIS — J449 Chronic obstructive pulmonary disease, unspecified: Secondary | ICD-10-CM

## 2010-12-13 LAB — CBC WITH DIFFERENTIAL/PLATELET
Basophils Absolute: 0 10*3/uL (ref 0.0–0.1)
Basophils Relative: 0.8 % (ref 0.0–3.0)
Eosinophils Absolute: 0 10*3/uL (ref 0.0–0.7)
Hemoglobin: 13.1 g/dL (ref 12.0–15.0)
Lymphocytes Relative: 25.2 % (ref 12.0–46.0)
Monocytes Relative: 12.2 % — ABNORMAL HIGH (ref 3.0–12.0)
Neutro Abs: 1.5 10*3/uL (ref 1.4–7.7)
Neutrophils Relative %: 61.6 % (ref 43.0–77.0)
RBC: 4.45 Mil/uL (ref 3.87–5.11)

## 2010-12-13 LAB — URINALYSIS, ROUTINE W REFLEX MICROSCOPIC
Hgb urine dipstick: NEGATIVE
Nitrite: NEGATIVE
Specific Gravity, Urine: 1.025 (ref 1.000–1.030)
Urine Glucose: NEGATIVE
Urobilinogen, UA: 1 (ref 0.0–1.0)

## 2010-12-13 LAB — HEPATIC FUNCTION PANEL
ALT: 11 U/L (ref 0–35)
Albumin: 4.1 g/dL (ref 3.5–5.2)
Total Protein: 8 g/dL (ref 6.0–8.3)

## 2010-12-13 LAB — BASIC METABOLIC PANEL
BUN: 25 mg/dL — ABNORMAL HIGH (ref 6–23)
Calcium: 9 mg/dL (ref 8.4–10.5)
Creatinine, Ser: 1.4 mg/dL — ABNORMAL HIGH (ref 0.4–1.2)
Glucose, Bld: 109 mg/dL — ABNORMAL HIGH (ref 70–99)
Potassium: 3.9 mEq/L (ref 3.5–5.1)
Sodium: 139 mEq/L (ref 135–145)

## 2010-12-13 LAB — LIPID PANEL
LDL Cholesterol: 91 mg/dL (ref 0–99)
Total CHOL/HDL Ratio: 3
VLDL: 17.6 mg/dL (ref 0.0–40.0)

## 2010-12-13 MED ORDER — METHYLPREDNISOLONE ACETATE 80 MG/ML IJ SUSP
120.0000 mg | Freq: Once | INTRAMUSCULAR | Status: AC
  Administered 2010-12-13: 120 mg via INTRAMUSCULAR

## 2010-12-13 MED ORDER — LEVOFLOXACIN 250 MG PO TABS: 250.0000 mg | ORAL_TABLET | Freq: Every day | ORAL | Status: AC

## 2010-12-13 MED ORDER — HYDROCODONE-HOMATROPINE 5-1.5 MG/5ML PO SYRP: 5.0000 mL | ORAL_SOLUTION | Freq: Four times a day (QID) | ORAL | Status: AC | PRN

## 2010-12-13 MED ORDER — PREDNISONE 10 MG PO TABS: 10.0000 mg | ORAL_TABLET | Freq: Every day | ORAL | Status: AC

## 2010-12-13 NOTE — Assessment & Plan Note (Signed)
stable overall by hx and exam, , and pt to continue medical treatment as before   

## 2010-12-13 NOTE — Assessment & Plan Note (Signed)
Mild to mod, for antibx course,  to f/u any worsening symptoms or concerns, for cough med as well 

## 2010-12-13 NOTE — Progress Notes (Signed)
Subjective:    Patient ID: [name], female    DOB: October 30, 1950, 60 y.o.   MRN: [id number]  HPI  Here for wellness and f/u;  Overall doing ok;  Pt denies CP, worsening SOB, DOE, wheezing, orthopnea, PND, worsening LE edema, palpitations, dizziness or syncope, except incidentally- Here with acute onset mild to mod 2-3 days ST, HA, general weakness and malaise, with prod cough greenish sputum, with mild onset wheeze/sob this am.   Pt denies neurological change such as new Headache, facial or extremity weakness.  Pt denies polydipsia, polyuria, or low sugar symptoms. Pt states overall good compliance with treatment and medications, good tolerability, and trying to follow lower cholesterol diet.  Pt denies worsening depressive symptoms, suicidal ideation or panic. No fever, wt loss, night sweats, loss of appetite, or other constitutional symptoms.  Pt states good ability with ADL's, low fall risk, home safety reviewed and adequate, no significant changes in hearing or vision, and occasionally active with exercise. Due for colonoscopy Past Medical History  Diagnosis Date  . Hypertension   . Genital warts     hx  . Anemia, iron deficiency   . Blood transfusion complicating pregnancy     after first child at 1 yo  . DJD (degenerative joint disease)     hands  . Cervical disc disease     with recurrent radicular pain, Dr. Montez Morita  . COPD (chronic obstructive pulmonary disease) 06/14/2010  . ALLERGIC RHINITIS 03/17/2008  . GOITER, MULTINODULAR 12/23/2009  . Cardiomyopathy     a. remote h/o cath with reported nonobs CAD; b. echo 5/12: EF 15%, mild LVH, mild MR, LAE, mod pericardial effusion with increased filling pressures  . Systolic CHF   . Hypothyroid   . Nonischemic cardiomyopathy   . CAD (coronary artery disease)     Nonobstructive by cardiac catheter 8/12:EF 10-15%, circumflex calcified without obstructive CAD, proximal LAD 20%, mid LAD 20%, proximal RCA 20%, mid RCA 20%.   Past Surgical  History  Procedure Date  . Cholecystectomy   . Cardiac catheterization 1995    with "timy blockage" [facility] Dr. Daisy Floro    reports that she has quit smoking. She does not have any smokeless tobacco history on file. She reports that she does not drink alcohol or use illicit drugs. family history includes Alcohol abuse in her other; Cancer in her sister; Depression in her sister; Diabetes in her father, mother, and sister; Heart disease in her father, mother, and sister; Hypertension in her father, mother, and sister; Stroke in her father and mother; Thyroid disease in her sister; and Transient ischemic attack in her maternal grandfather. Allergies  Allergen Reactions  . Ivp Dye (Iodinated Diagnostic Agents)   . Latex     Allergic to latex gloves  . Penicillins     REACTION: rash  . Strawberry    Current Outpatient Prescriptions on File Prior to Visit  Medication Sig Dispense Refill  . albuterol (PROVENTIL,VENTOLIN) 90 MCG/ACT inhaler Inhale 2 puffs into the lungs every 4 (four) hours as needed.        Marland Kitchen aspirin 81 MG EC tablet Take 81 mg by mouth daily.        . budesonide-formoterol (SYMBICORT) 160-4.5 MCG/ACT inhaler Inhale 2 puffs into the lungs 2 (two) times daily.        . carvedilol (COREG) 6.25 MG tablet 1/2 tab po bid       . Cholecalciferol (VITAMIN D3) 1000 UNITS CAPS Take by mouth daily.        Marland Kitchen  fexofenadine (ALLEGRA) 180 MG tablet Take 180 mg by mouth daily.        . fluticasone (VERAMYST) 27.5 MCG/SPRAY nasal spray Place 2 sprays into the nose daily.        . furosemide (LASIX) 40 MG tablet Take 20 mg by mouth daily.        Marland Kitchen levothyroxine (SYNTHROID, LEVOTHROID) 150 MCG tablet Take 150 mcg by mouth daily.        Marland Kitchen lisinopril (PRINIVIL,ZESTRIL) 20 MG tablet 1/2 tablet by mouth daily      . spironolactone (ALDACTONE) 50 MG tablet Take 50 mg by mouth daily.        Marland Kitchen tiotropium (SPIRIVA) 18 MCG inhalation capsule Place 18 mcg into inhaler and inhale daily.        Marland Kitchen  triamcinolone (KENALOG) 0.025 % cream Apply topically 4 (four) times daily. As needed for itching  30 g  1   No current facility-administered medications on file prior to visit.   Review of Systems Review of Systems  Constitutional: Negative for diaphoresis, activity change, appetite change and unexpected weight change.  HENT: Negative for hearing loss, ear pain, facial swelling, mouth sores and neck stiffness.   Eyes: Negative for pain, redness and visual disturbance.  Respiratory: Negative for shortness of breath and wheezing.  except for this am as above Cardiovascular: Negative for chest pain and palpitations.  Gastrointestinal: Negative for diarrhea, blood in stool, abdominal distention and rectal pain.  Genitourinary: Negative for hematuria, flank pain and decreased urine volume.  Musculoskeletal: Negative for myalgias and joint swelling.  Skin: Negative for color change and wound.  Neurological: Negative for syncope and numbness.  Hematological: Negative for adenopathy.  Psychiatric/Behavioral: Negative for hallucinations, self-injury, decreased concentration and agitation.     Objective:   Physical Exam BP 80/58  Pulse 83  Temp(Src) 97.5 F (36.4 C) (Oral)  Ht 5\' 5"  (1.651 m)  Wt 127 lb (57.607 kg)  BMI 21.13 kg/m2  SpO2 96% Physical Exam  VS noted, mild ill Constitutional: Pt is oriented to person, place, and time. Appears well-developed and well-nourished.  HENT:  Head: Normocephalic and atraumatic.  Right Ear: External ear normal.  Left Ear: External ear normal.  Nose: Nose normal.  Bilat tm's mild erythema.  Sinus nontender.  Pharynx mild erythema Mouth/Throat: Oropharynx is clear and moist.  Eyes: Conjunctivae and EOM are normal. Pupils are equal, round, and reactive to light.  Neck: Normal range of motion. Neck supple. No JVD present. No tracheal deviation present.  Cardiovascular: Normal rate, regular rhythm, normal heart sounds and intact distal pulses.     Pulmonary/Chest: Effort normal and breath sounds decresed bilat with mild wheeze, no rales.  Abdominal: Soft. Bowel sounds are normal. There is no tenderness.  Musculoskeletal: Normal range of motion. Exhibits no edema.  Lymphadenopathy:  Has no cervical adenopathy.  Neurological: Pt is alert and oriented to person, place, and time. Pt has normal reflexes. No cranial nerve deficit.  Skin: Skin is warm and dry. No rash noted.  Psychiatric:  Has  normal mood and affect. Behavior is normal.     Assessment & Plan:

## 2010-12-13 NOTE — Patient Instructions (Addendum)
You had the steroid shot today Take all new medications as prescribed Continue all other medications as before Please go to XRAY in the Basement for the x-ray test Please go to LAB in the Basement for the blood and/or urine tests to be done today Please call the phone number [phone] (the PhoneTree System) for results of testing in 2-3 days;  When calling, simply dial the number, and when prompted enter the MRN number above (the Medical Record Number) and the # key, then the message should start. You are given the work note today Please return in 6 months, or sooner if needed

## 2010-12-13 NOTE — Assessment & Plan Note (Signed)
Overall doing well, age appropriate education and counseling updated, referrals for preventative services and immunizations addressed, dietary and smoking counseling addressed, most recent labs and ECG reviewed.  I have personally reviewed and have noted: 1) the patient's medical and social history 2) The pt's use of alcohol, tobacco, and illicit drugs 3) The patient's current medications and supplements 4) Functional ability including ADL's, fall risk, home safety risk, hearing and visual impairment 5) Diet and physical activities 6) Evidence for depression or mood disorder 7) The patient's height, weight, and BMI have been recorded in the chart I have made referrals, and provided counseling and education based on review of the above Also due for colonoscopy - will arrange

## 2010-12-14 ENCOUNTER — Telehealth: Payer: Self-pay

## 2010-12-14 DIAGNOSIS — E038 Other specified hypothyroidism: Secondary | ICD-10-CM

## 2010-12-14 NOTE — Telephone Encounter (Signed)
Put order in for TSH lab

## 2010-12-21 ENCOUNTER — Other Ambulatory Visit: Payer: Self-pay

## 2010-12-21 MED ORDER — LISINOPRIL 20 MG PO TABS: 10.0000 mg | ORAL_TABLET | Freq: Every day | ORAL | Status: DC

## 2010-12-27 ENCOUNTER — Encounter: Payer: Self-pay | Admitting: *Deleted

## 2010-12-27 ENCOUNTER — Ambulatory Visit (INDEPENDENT_AMBULATORY_CARE_PROVIDER_SITE_OTHER): Payer: BC Managed Care – PPO | Admitting: Internal Medicine

## 2010-12-27 ENCOUNTER — Encounter: Payer: Self-pay | Admitting: Internal Medicine

## 2010-12-27 DIAGNOSIS — I5022 Chronic systolic (congestive) heart failure: Secondary | ICD-10-CM

## 2010-12-27 DIAGNOSIS — I10 Essential (primary) hypertension: Secondary | ICD-10-CM

## 2010-12-27 NOTE — Progress Notes (Signed)
Addended by: Sherri Rad C on: 12/27/2010 10:39 AM   Modules accepted: Orders

## 2010-12-27 NOTE — Patient Instructions (Addendum)
Your physician has recommended that you have a defibrillator inserted. An implantable cardioverter defibrillator (ICD) is a small device that is placed in your chest or, in rare cases, your abdomen. This device uses electrical pulses or shocks to help control life-threatening, irregular heartbeats that could lead the heart to suddenly stop beating (sudden cardiac arrest). Leads are attached to the ICD that goes into your heart. This is done in the hospital and usually requires an overnight stay. Please see the instruction sheet given to you today for more information.  Your physician has recommended you make the following change in your medication:  1) take lasix (furosemide) 40mg  twice daily for the next 4 days (the second dose should be just after lunch), then return to 40mg  once daily.

## 2010-12-27 NOTE — Assessment & Plan Note (Signed)
If anything, her blood pressure is on the low side. She will continue her current medications.

## 2010-12-27 NOTE — Progress Notes (Signed)
HPI [name] is referred today for consideration for prophylactic ICD implantation. The patient is a very pleasant 60-year-old woman with a long-standing nonischemic cardiomyopathy. She has chronic class 2-3 congestive heart failure. Her ejection fraction is 10-15% by echo. On maximal medical therapy, it is not improved despite beta blockers and ACE inhibitors. She has not had syncope. Most recently she has been bothered by pneumonia and is on antibiotics and steroids. Despite all this she continues to work. She does not experience peripheral edema. Recently she has had a cough with productive greenish brown sputum. Allergies  Allergen Reactions  . Ivp Dye (Iodinated Diagnostic Agents)   . Latex     Allergic to latex gloves  . Penicillins     REACTION: rash  . Strawberry      Current Outpatient Prescriptions  Medication Sig Dispense Refill  . albuterol (PROVENTIL,VENTOLIN) 90 MCG/ACT inhaler Inhale 2 puffs into the lungs every 4 (four) hours as needed.        . aspirin 81 MG EC tablet Take 81 mg by mouth daily.        . budesonide-formoterol (SYMBICORT) 160-4.5 MCG/ACT inhaler Inhale 2 puffs into the lungs 2 (two) times daily.        . carvedilol (COREG) 6.25 MG tablet 1/2 tab po bid       . Cholecalciferol (VITAMIN D3) 1000 UNITS CAPS Take by mouth daily.        . fexofenadine (ALLEGRA) 180 MG tablet Take 180 mg by mouth daily.        . fluticasone (VERAMYST) 27.5 MCG/SPRAY nasal spray Place 2 sprays into the nose daily.        . furosemide (LASIX) 40 MG tablet Take 40 mg by mouth daily.       . HYDROcodone-homatropine (HYCODAN) 5-1.5 MG/5ML syrup Take by mouth every 6 (six) hours as needed.        . levothyroxine (SYNTHROID, LEVOTHROID) 150 MCG tablet Take 150 mcg by mouth daily.        . lisinopril (PRINIVIL,ZESTRIL) 20 MG tablet Take 0.5 tablets (10 mg total) by mouth daily. 1/2 tablet by mouth daily  45 tablet  1  . predniSONE (DELTASONE) 10 MG tablet Take 10 mg by mouth. As directed        . spironolactone (ALDACTONE) 50 MG tablet Take 50 mg by mouth daily.        . tiotropium (SPIRIVA) 18 MCG inhalation capsule Place 18 mcg into inhaler and inhale daily.        . triamcinolone (KENALOG) 0.025 % cream Apply topically 4 (four) times daily. As needed for itching  30 g  1     Past Medical History  Diagnosis Date  . Hypertension   . Genital warts     hx  . Anemia, iron deficiency   . Blood transfusion complicating pregnancy     after first child at 20 yo  . DJD (degenerative joint disease)     hands  . Cervical disc disease     with recurrent radicular pain, Dr. Carter  . COPD (chronic obstructive pulmonary disease) 06/14/2010  . ALLERGIC RHINITIS 03/17/2008  . GOITER, MULTINODULAR 12/23/2009  . Cardiomyopathy     a. remote h/o cath with reported nonobs CAD; b. echo 5/12: EF 15%, mild LVH, mild MR, LAE, mod pericardial effusion with increased filling pressures  . Systolic CHF   . Hypothyroid   . Nonischemic cardiomyopathy   . CAD (coronary artery disease)       Nonobstructive by cardiac catheter 8/12:EF 10-15%, circumflex calcified without obstructive CAD, proximal LAD 20%, mid LAD 20%, proximal RCA 20%, mid RCA 20%.    ROS:   All systems reviewed and negative except as noted in the HPI.   Past Surgical History  Procedure Date  . Cholecystectomy   . Cardiac catheterization 1995    with "timy blockage" [place] Dr. Harshaw     Family History  Problem Relation Age of Onset  . Stroke Mother   . Heart disease Mother   . Hypertension Mother   . Diabetes Mother   . Stroke Father   . Heart disease Father   . Hypertension Father   . Diabetes Father   . Cancer Sister     colon  . Heart disease Sister     pacemaker  . Hypertension Sister   . Depression Sister   . Diabetes Sister   . Thyroid disease Sister     uncertain type  . Transient ischemic attack Maternal Grandfather   . Alcohol abuse Other      History   Social History  . Marital  Status: Married    Spouse Name: N/A    Number of Children: 3  . Years of Education: N/A   Occupational History  .     Social History Main Topics  . Smoking status: Former Smoker  . Smokeless tobacco: Not on file   Comment: very light, occasional stopped she states now  . Alcohol Use: No  . Drug Use: No  . Sexually Active: Not on file   Other Topics Concern  . Not on file   Social History Narrative  . No narrative on file     BP 118/60  Pulse 50  Ht 5' 5" (1.651 m)  Wt 131 lb 12.8 oz (59.784 kg)  BMI 21.93 kg/m2  Physical Exam:  Chronically ill appearing NAD HEENT: Unremarkable Neck:  7 cm JVD, no thyromegally Lymphatics:  No adenopathy Back:  No CVA tenderness Lungs:  Clear with no wheezes or rhonchi. Rales are present in the left base as well as egophony. HEART:  Regular rate rhythm, no murmurs, no rubs, no clicks. PMI is enlarged and laterally displaced. Abd:  soft, positive bowel sounds, no organomegally, no rebound, no guarding Ext:  2 plus pulses, no edema, no cyanosis, no clubbing Skin:  No rashes no nodules Neuro:  CN II through XII intact, motor grossly intact  EKG Sinus bradycardia with LVH.  Assess/Plan:   

## 2010-12-27 NOTE — Assessment & Plan Note (Signed)
I have discussed the treatment options with the patient. The risks, goals, benefits, and expectations of prophylactic ICD implantation have been discussed with the patient. She is considering her options and would like to proceed with defibrillator insertion. This will be scheduled in several weeks once he has recovered from her pneumonia. For today I think she is slightly volume overloaded and I recommended that she increase her Lasix to twice daily for 4 days.

## 2011-01-09 NOTE — Progress Notes (Signed)
Addended by: Dennis Bast F on: 01/09/2011 04:02 PM   Modules accepted: Orders

## 2011-01-16 ENCOUNTER — Encounter (HOSPITAL_COMMUNITY): Payer: Self-pay

## 2011-01-16 ENCOUNTER — Other Ambulatory Visit (INDEPENDENT_AMBULATORY_CARE_PROVIDER_SITE_OTHER): Payer: BC Managed Care – PPO | Admitting: *Deleted

## 2011-01-16 DIAGNOSIS — E038 Other specified hypothyroidism: Secondary | ICD-10-CM

## 2011-01-16 DIAGNOSIS — I5022 Chronic systolic (congestive) heart failure: Secondary | ICD-10-CM

## 2011-01-16 DIAGNOSIS — I10 Essential (primary) hypertension: Secondary | ICD-10-CM

## 2011-01-16 LAB — CBC WITH DIFFERENTIAL/PLATELET
Eosinophils Relative: 2.4 % (ref 0.0–5.0)
HCT: 35.6 % — ABNORMAL LOW (ref 36.0–46.0)
Hemoglobin: 12 g/dL (ref 12.0–15.0)
Lymphs Abs: 1 10*3/uL (ref 0.7–4.0)
Monocytes Relative: 5.5 % (ref 3.0–12.0)
Neutro Abs: 2.2 10*3/uL (ref 1.4–7.7)
RBC: 3.95 Mil/uL (ref 3.87–5.11)
WBC: 3.4 10*3/uL — ABNORMAL LOW (ref 4.5–10.5)

## 2011-01-16 LAB — BASIC METABOLIC PANEL
CO2: 29 mEq/L (ref 19–32)
Calcium: 9.5 mg/dL (ref 8.4–10.5)
Creatinine, Ser: 1.1 mg/dL (ref 0.4–1.2)
Glucose, Bld: 88 mg/dL (ref 70–99)

## 2011-01-16 LAB — PROTIME-INR: INR: 1 ratio (ref 0.8–1.0)

## 2011-01-21 MED ORDER — SODIUM CHLORIDE 0.9 % IR SOLN
Status: DC
  Filled 2011-01-21: qty 2

## 2011-01-21 MED ORDER — VANCOMYCIN HCL IN DEXTROSE 1-5 GM/200ML-% IV SOLN
1000.0000 mg | INTRAVENOUS | Status: DC
  Filled 2011-01-21: qty 200

## 2011-01-22 ENCOUNTER — Ambulatory Visit (HOSPITAL_COMMUNITY)
Admission: RE | Admit: 2011-01-22 | Discharge: 2011-01-23 | Disposition: A | Discharge status: Home / Self Care | Payer: BC Managed Care – PPO | Source: Ambulatory Visit | Attending: Internal Medicine | Admitting: Internal Medicine

## 2011-01-22 ENCOUNTER — Encounter (HOSPITAL_COMMUNITY)
Admission: RE | Disposition: A | Discharge status: Home / Self Care | Payer: Self-pay | Source: Ambulatory Visit | Attending: Internal Medicine

## 2011-01-22 ENCOUNTER — Encounter (HOSPITAL_COMMUNITY): Payer: Self-pay | Admitting: *Deleted

## 2011-01-22 DIAGNOSIS — I447 Left bundle-branch block, unspecified: Secondary | ICD-10-CM | POA: Insufficient documentation

## 2011-01-22 DIAGNOSIS — I428 Other cardiomyopathies: Secondary | ICD-10-CM

## 2011-01-22 DIAGNOSIS — J4489 Other specified chronic obstructive pulmonary disease: Secondary | ICD-10-CM | POA: Insufficient documentation

## 2011-01-22 DIAGNOSIS — I10 Essential (primary) hypertension: Secondary | ICD-10-CM | POA: Insufficient documentation

## 2011-01-22 DIAGNOSIS — I5022 Chronic systolic (congestive) heart failure: Secondary | ICD-10-CM

## 2011-01-22 DIAGNOSIS — I2510 Atherosclerotic heart disease of native coronary artery without angina pectoris: Secondary | ICD-10-CM | POA: Insufficient documentation

## 2011-01-22 DIAGNOSIS — I429 Cardiomyopathy, unspecified: Secondary | ICD-10-CM | POA: Insufficient documentation

## 2011-01-22 DIAGNOSIS — I509 Heart failure, unspecified: Secondary | ICD-10-CM | POA: Insufficient documentation

## 2011-01-22 DIAGNOSIS — J449 Chronic obstructive pulmonary disease, unspecified: Secondary | ICD-10-CM | POA: Insufficient documentation

## 2011-01-22 HISTORY — PX: IMPLANTABLE CARDIOVERTER DEFIBRILLATOR IMPLANT: SHX5473

## 2011-01-22 HISTORY — PX: CARDIAC DEFIBRILLATOR PLACEMENT: SHX171

## 2011-01-22 LAB — SURGICAL PCR SCREEN: Staphylococcus aureus: NEGATIVE

## 2011-01-22 SURGERY — IMPLANTABLE CARDIOVERTER DEFIBRILLATOR IMPLANT
Anesthesia: LOCAL

## 2011-01-22 MED ORDER — DIPHENHYDRAMINE HCL 50 MG/ML IJ SOLN
25.0000 mg | Freq: Once | INTRAMUSCULAR | Status: AC
  Administered 2011-01-22: 25 mg via INTRAVENOUS
  Filled 2011-01-22: qty 1

## 2011-01-22 MED ORDER — FENTANYL CITRATE 0.05 MG/ML IJ SOLN
INTRAMUSCULAR | Status: AC
  Filled 2011-01-22: qty 2

## 2011-01-22 MED ORDER — FUROSEMIDE 40 MG PO TABS
40.0000 mg | ORAL_TABLET | Freq: Every day | ORAL | Status: DC
  Administered 2011-01-23: 40 mg via ORAL
  Filled 2011-01-22 (×2): qty 1

## 2011-01-22 MED ORDER — SODIUM CHLORIDE 0.9 % IV SOLN
250.0000 mL | INTRAVENOUS | Status: DC
  Administered 2011-01-22: 250 mL via INTRAVENOUS

## 2011-01-22 MED ORDER — FLUTICASONE FUROATE 27.5 MCG/SPRAY NA SUSP: 2.0000 | Freq: Every day | NASAL | Status: DC

## 2011-01-22 MED ORDER — ALBUTEROL 90 MCG/ACT IN AERS
2.0000 | INHALATION_SPRAY | Freq: Four times a day (QID) | RESPIRATORY_TRACT | Status: DC | PRN
  Filled 2011-01-22: qty 2

## 2011-01-22 MED ORDER — SODIUM CHLORIDE 0.9 % IV SOLN
INTRAVENOUS | Status: DC
  Administered 2011-01-22: 07:00:00 via INTRAVENOUS

## 2011-01-22 MED ORDER — MUPIROCIN 2 % EX OINT
TOPICAL_OINTMENT | Freq: Once | CUTANEOUS | Status: AC
  Administered 2011-01-22: 1 via NASAL
  Filled 2011-01-22 (×2): qty 22

## 2011-01-22 MED ORDER — SODIUM CHLORIDE 0.9 % IJ SOLN: 3.0000 mL | INTRAMUSCULAR | Status: DC | PRN

## 2011-01-22 MED ORDER — LISINOPRIL 10 MG PO TABS
10.0000 mg | ORAL_TABLET | Freq: Every day | ORAL | Status: DC
  Filled 2011-01-22 (×2): qty 1

## 2011-01-22 MED ORDER — FLUTICASONE PROPIONATE 50 MCG/ACT NA SUSP
2.0000 | Freq: Every day | NASAL | Status: DC
  Administered 2011-01-22 – 2011-01-23 (×2): 2 via NASAL
  Filled 2011-01-22: qty 16

## 2011-01-22 MED ORDER — TIOTROPIUM BROMIDE MONOHYDRATE 18 MCG IN CAPS
18.0000 ug | ORAL_CAPSULE | RESPIRATORY_TRACT | Status: DC
  Administered 2011-01-23: 18 ug via RESPIRATORY_TRACT
  Filled 2011-01-22: qty 5

## 2011-01-22 MED ORDER — LIDOCAINE HCL (PF) 1 % IJ SOLN
INTRAMUSCULAR | Status: AC
  Filled 2011-01-22: qty 60

## 2011-01-22 MED ORDER — SPIRONOLACTONE 50 MG PO TABS
50.0000 mg | ORAL_TABLET | Freq: Every day | ORAL | Status: DC
  Administered 2011-01-23: 50 mg via ORAL
  Filled 2011-01-22 (×2): qty 1

## 2011-01-22 MED ORDER — METHYLPREDNISOLONE SODIUM SUCC 125 MG IJ SOLR
62.5000 mg | Freq: Once | INTRAMUSCULAR | Status: AC
  Administered 2011-01-22: 62.5 mg via INTRAVENOUS
  Filled 2011-01-22: qty 2

## 2011-01-22 MED ORDER — BUDESONIDE-FORMOTEROL FUMARATE 160-4.5 MCG/ACT IN AERO
2.0000 | INHALATION_SPRAY | Freq: Two times a day (BID) | RESPIRATORY_TRACT | Status: DC
  Administered 2011-01-22 – 2011-01-23 (×2): 2 via RESPIRATORY_TRACT
  Filled 2011-01-22: qty 6

## 2011-01-22 MED ORDER — MIDAZOLAM HCL 5 MG/5ML IJ SOLN
INTRAMUSCULAR | Status: AC
  Filled 2011-01-22: qty 5

## 2011-01-22 MED ORDER — HEPARIN (PORCINE) IN NACL 2-0.9 UNIT/ML-% IJ SOLN
INTRAMUSCULAR | Status: AC
  Filled 2011-01-22: qty 1000

## 2011-01-22 MED ORDER — SODIUM CHLORIDE 0.9 % IV SOLN: 250.0000 mL | INTRAVENOUS | Status: DC

## 2011-01-22 MED ORDER — SODIUM CHLORIDE 0.45 % IV SOLN: INTRAVENOUS | Status: DC

## 2011-01-22 MED ORDER — ASPIRIN 81 MG PO TBEC
81.0000 mg | DELAYED_RELEASE_TABLET | Freq: Every day | ORAL | Status: DC
  Administered 2011-01-23: 81 mg via ORAL
  Filled 2011-01-22 (×2): qty 1

## 2011-01-22 MED ORDER — VANCOMYCIN HCL IN DEXTROSE 1-5 GM/200ML-% IV SOLN
1000.0000 mg | INTRAVENOUS | Status: AC
  Administered 2011-01-22: 1000 mg via INTRAVENOUS
  Filled 2011-01-22: qty 200

## 2011-01-22 MED ORDER — ACETAMINOPHEN 325 MG PO TABS
325.0000 mg | ORAL_TABLET | ORAL | Status: DC | PRN
  Administered 2011-01-23: 650 mg via ORAL
  Filled 2011-01-22: qty 2

## 2011-01-22 MED ORDER — ACETAMINOPHEN 500 MG PO TABS
1000.0000 mg | ORAL_TABLET | Freq: Four times a day (QID) | ORAL | Status: AC
  Administered 2011-01-22 (×3): 1000 mg via ORAL
  Filled 2011-01-22 (×4): qty 2

## 2011-01-22 MED ORDER — LEVOTHYROXINE SODIUM 150 MCG PO TABS
150.0000 ug | ORAL_TABLET | Freq: Every day | ORAL | Status: DC
  Administered 2011-01-23: 150 ug via ORAL
  Filled 2011-01-22 (×2): qty 1

## 2011-01-22 MED ORDER — ONDANSETRON HCL 4 MG/2ML IJ SOLN: 4.0000 mg | Freq: Four times a day (QID) | INTRAMUSCULAR | Status: DC | PRN

## 2011-01-22 MED ORDER — SODIUM CHLORIDE 0.9 % IR SOLN
Status: AC
  Administered 2011-01-22: 08:00:00
  Filled 2011-01-22: qty 2

## 2011-01-22 MED ORDER — CARVEDILOL 3.125 MG PO TABS
3.1250 mg | ORAL_TABLET | Freq: Two times a day (BID) | ORAL | Status: DC
  Administered 2011-01-22 – 2011-01-23 (×2): 3.125 mg via ORAL
  Filled 2011-01-22 (×4): qty 1

## 2011-01-22 MED ORDER — FAMOTIDINE IN NACL 20-0.9 MG/50ML-% IV SOLN
20.0000 mg | Freq: Once | INTRAVENOUS | Status: AC
  Administered 2011-01-22: 20 mg via INTRAVENOUS
  Filled 2011-01-22: qty 50

## 2011-01-22 MED ORDER — VITAMIN D3 25 MCG (1000 UT) PO CAPS
1000.0000 [IU] | ORAL_CAPSULE | Freq: Every day | ORAL | Status: DC
  Administered 2011-01-23: 1000 [IU] via ORAL
  Filled 2011-01-22 (×2): qty 1

## 2011-01-22 MED ORDER — SODIUM CHLORIDE 0.9 % IJ SOLN
3.0000 mL | Freq: Two times a day (BID) | INTRAMUSCULAR | Status: DC
  Administered 2011-01-22 – 2011-01-23 (×3): 3 mL via INTRAVENOUS

## 2011-01-22 NOTE — Op Note (Signed)
Successful BiV ICD. Dictation # [id number]

## 2011-01-22 NOTE — Op Note (Signed)
NAMELINLEE, CROMIE NO.:  [id number]  MEDICAL RECORD NO.:  [id number]  LOCATION:  [facility]                         FACILITY:  [facility]  PHYSICIAN:  Doylene Canning. Ladona Ridgel, MD    DATE OF BIRTH:  07-Oct-1950  DATE OF PROCEDURE: DATE OF DISCHARGE:                              OPERATIVE REPORT   PROCEDURE PERFORMED:  Insertion of a biventricular ICD.  INTRODUCTION:  The patient is a 60 year old woman with a history of nonischemic cardiomyopathy, chronic systolic heart failure, ejection fraction 15% despite maximal medical therapy, and left bundle-branch block.  She is referred now for BiV ICD insertion.  PROCEDURE:  After informed consent was obtained, the patient was taken to the diagnostic EP lab in a fasting state.  After usual preparation and draping, intravenous fentanyl and midazolam was given for sedation. 30 mL of lidocaine was infiltrated into the left infraclavicular region. A 7-cm incision was carried out over this region.  Electrocautery was utilized to dissect down to the fascia plane.  The left subclavian vein was punctured x3.  The Medtronic model 6947 58 cm active fixation defibrillation lead, serial number [id number] was advanced into the right ventricle.  Mapping was carried out at the final site and the RV apical septum.  The R-waves measured 11 mV.  The pacing impedance was 800 ohms.  The threshold was 1 volt at 0.5 msec.  There was a large injury current with active fixation of the lead.  With the ventricular lead in satisfactory position, attention was turned to the placement of the atrial lead, which was placed in the anterolateral portion of the right atrium where P-waves measured 2 multivitamin and pacing impedance was 600 ohms with the lead actively fixed.  There was a large injury current with active fixation lead.  With active fixation of both atrial and RV leads and then being in satisfactory position, attention was turned to placement of the LV  lead.  The coronary sinus guiding catheter was advanced into the coronary sinus in the usual manner with an MB 2 guide.  A 6-French hexapolar EP catheter was utilized.  Venography of the coronary sinus was carried out.  This demonstrated a fairly large lateral vein, which was elected for LV lead placement.  The Medtronic model 4194 88 cm LV pacing lead serial number [id number] was advanced into the lateral vein of the left ventricle.  At the final site about halfway from base to apex, the pacing threshold was around 1.5 V at 0.5 msec.  It should be noted that pacing off the distal pole resulted in diaphragmatic stimulation as well as bipolar pacing resulted in diaphragmatic stimulation.  Pacing from the proximal coil, however, did not result in diaphragmatic stimulation and the thresholds were satisfactory.  With these satisfactory parameters in mind, the guiding catheter was liberated from the LV pacing lead in the usual manner.  At this point, the leads were secured to the subpectoral fascia with a figure-of-eight silk suture.  The sewing sleeve was secured with silk suture.  Electrocautery was then utilized to make subcutaneous pocket. Antibiotic irrigation was utilized to irrigate the pocket.  The Medtronic Consulta CRTD BiV ICD, serial  number [id number] was connected to the atrial RV and LV leads placed back in the subcutaneous pocket.  At this point, additional antibiotic irrigation, the incision was closed with 2-0 and 3-0 Vicryl.  Benzoin and Steri-Strips were painted on the skin.  A pressure dressing was applied, and the patient was more deeply sedated for defibrillation threshold testing.  After I had scrubbed out and could better manage and assess airway, the patient was more deeply sedated with fentanyl and Versed.  A T-wave induction shock was then delivered, and a 20-joule shock was subsequently given, terminated ventricular fibrillation, and restoring sinus rhythm.   At this point, no additional defibrillation threshold testing was carried out and the patient was returned to the recovery area in satisfactory condition.  COMPLICATIONS:  There were no immediate procedure complications.  RESULTS:  This demonstrates successful implantation of a Medtronic biventricular ICD in a 60 year old woman with a nonischemic cardiomyopathy, EF 15%, left bundle-branch block, and class 2-3 heart failure.     Doylene Canning. Ladona Ridgel, MD     GWT/MEDQ  D:  01/22/2011  T:  01/22/2011  Job:  [id number]

## 2011-01-22 NOTE — H&P (View-Only) (Signed)
HPI [name] is referred today for consideration for prophylactic ICD implantation. The patient is a very pleasant 60 year old woman with a long-standing nonischemic cardiomyopathy. She has chronic class 2-3 congestive heart failure. Her ejection fraction is 10-15% by echo. On maximal medical therapy, it is not improved despite beta blockers and ACE inhibitors. She has not had syncope. Most recently she has been bothered by pneumonia and is on antibiotics and steroids. Despite all this she continues to work. She does not experience peripheral edema. Recently she has had a cough with productive greenish brown sputum. Allergies  Allergen Reactions  . Ivp Dye (Iodinated Diagnostic Agents)   . Latex     Allergic to latex gloves  . Penicillins     REACTION: rash  . Strawberry      Current Outpatient Prescriptions  Medication Sig Dispense Refill  . albuterol (PROVENTIL,VENTOLIN) 90 MCG/ACT inhaler Inhale 2 puffs into the lungs every 4 (four) hours as needed.        Marland Kitchen aspirin 81 MG EC tablet Take 81 mg by mouth daily.        . budesonide-formoterol (SYMBICORT) 160-4.5 MCG/ACT inhaler Inhale 2 puffs into the lungs 2 (two) times daily.        . carvedilol (COREG) 6.25 MG tablet 1/2 tab po bid       . Cholecalciferol (VITAMIN D3) 1000 UNITS CAPS Take by mouth daily.        . fexofenadine (ALLEGRA) 180 MG tablet Take 180 mg by mouth daily.        . fluticasone (VERAMYST) 27.5 MCG/SPRAY nasal spray Place 2 sprays into the nose daily.        . furosemide (LASIX) 40 MG tablet Take 40 mg by mouth daily.       Marland Kitchen HYDROcodone-homatropine (HYCODAN) 5-1.5 MG/5ML syrup Take by mouth every 6 (six) hours as needed.        Marland Kitchen levothyroxine (SYNTHROID, LEVOTHROID) 150 MCG tablet Take 150 mcg by mouth daily.        Marland Kitchen lisinopril (PRINIVIL,ZESTRIL) 20 MG tablet Take 0.5 tablets (10 mg total) by mouth daily. 1/2 tablet by mouth daily  45 tablet  1  . predniSONE (DELTASONE) 10 MG tablet Take 10 mg by mouth. As directed        . spironolactone (ALDACTONE) 50 MG tablet Take 50 mg by mouth daily.        Marland Kitchen tiotropium (SPIRIVA) 18 MCG inhalation capsule Place 18 mcg into inhaler and inhale daily.        Marland Kitchen triamcinolone (KENALOG) 0.025 % cream Apply topically 4 (four) times daily. As needed for itching  30 g  1     Past Medical History  Diagnosis Date  . Hypertension   . Genital warts     hx  . Anemia, iron deficiency   . Blood transfusion complicating pregnancy     after first child at 31 yo  . DJD (degenerative joint disease)     hands  . Cervical disc disease     with recurrent radicular pain, Dr. Montez Morita  . COPD (chronic obstructive pulmonary disease) 06/14/2010  . ALLERGIC RHINITIS 03/17/2008  . GOITER, MULTINODULAR 12/23/2009  . Cardiomyopathy     a. remote h/o cath with reported nonobs CAD; b. echo 5/12: EF 15%, mild LVH, mild MR, LAE, mod pericardial effusion with increased filling pressures  . Systolic CHF   . Hypothyroid   . Nonischemic cardiomyopathy   . CAD (coronary artery disease)  Nonobstructive by cardiac catheter 8/12:EF 10-15%, circumflex calcified without obstructive CAD, proximal LAD 20%, mid LAD 20%, proximal RCA 20%, mid RCA 20%.    ROS:   All systems reviewed and negative except as noted in the HPI.   Past Surgical History  Procedure Date  . Cholecystectomy   . Cardiac catheterization 1995    with "timy blockage" [facility] Dr. Daisy Floro     Family History  Problem Relation Age of Onset  . Stroke Mother   . Heart disease Mother   . Hypertension Mother   . Diabetes Mother   . Stroke Father   . Heart disease Father   . Hypertension Father   . Diabetes Father   . Cancer Sister     colon  . Heart disease Sister     pacemaker  . Hypertension Sister   . Depression Sister   . Diabetes Sister   . Thyroid disease Sister     uncertain type  . Transient ischemic attack Maternal Grandfather   . Alcohol abuse Other      History   Social History  . Marital  Status: Married    Spouse Name: N/A    Number of Children: 3  . Years of Education: N/A   Occupational History  .     Social History Main Topics  . Smoking status: Former Games developer  . Smokeless tobacco: Not on file   Comment: very light, occasional stopped she states now  . Alcohol Use: No  . Drug Use: No  . Sexually Active: Not on file   Other Topics Concern  . Not on file   Social History Narrative  . No narrative on file     BP 118/60  Pulse 50  Ht 5\' 5"  (1.651 m)  Wt 131 lb 12.8 oz (59.784 kg)  BMI 21.93 kg/m2  Physical Exam:  Chronically ill appearing NAD HEENT: Unremarkable Neck:  7 cm JVD, no thyromegally Lymphatics:  No adenopathy Back:  No CVA tenderness Lungs:  Clear with no wheezes or rhonchi. Rales are present in the left base as well as egophony. HEART:  Regular rate rhythm, no murmurs, no rubs, no clicks. PMI is enlarged and laterally displaced. Abd:  soft, positive bowel sounds, no organomegally, no rebound, no guarding Ext:  2 plus pulses, no edema, no cyanosis, no clubbing Skin:  No rashes no nodules Neuro:  CN II through XII intact, motor grossly intact  EKG Sinus bradycardia with LVH.  Assess/Plan:

## 2011-01-22 NOTE — Interval H&P Note (Signed)
History and Physical Interval Note:   01/22/2011   8:02 AM   [name]  has presented today for surgery, with the diagnosis of Heart Failure  The various methods of treatment have been discussed with the patient and family. After consideration of risks, benefits and other options for treatment, the patient has consented to  Procedure(s): IMPLANTABLE CARDIOVERTER DEFIBRILLATOR IMPLANT as a surgical intervention .  The patients' history has been reviewed, patient examined, no change in status, stable for surgery.  I have reviewed the patients' chart and labs.  Questions were answered to the patient's satisfaction.  Since prior clinic visit, ECG demonstrates LBBB and 2D echo demonstrated disynchrony. I will plan BiV ICD.    Lewayne Bunting  MD

## 2011-01-23 ENCOUNTER — Other Ambulatory Visit: Payer: Self-pay | Admitting: *Deleted

## 2011-01-23 ENCOUNTER — Other Ambulatory Visit: Payer: Self-pay

## 2011-01-23 ENCOUNTER — Ambulatory Visit (HOSPITAL_COMMUNITY): Payer: BC Managed Care – PPO

## 2011-01-23 ENCOUNTER — Encounter (HOSPITAL_COMMUNITY): Payer: Self-pay

## 2011-01-23 DIAGNOSIS — I5031 Acute diastolic (congestive) heart failure: Secondary | ICD-10-CM

## 2011-01-23 MED ORDER — VANCOMYCIN HCL IN DEXTROSE 1-5 GM/200ML-% IV SOLN
1000.0000 mg | Freq: Once | INTRAVENOUS | Status: AC
  Administered 2011-01-23: 1000 mg via INTRAVENOUS
  Filled 2011-01-23: qty 200

## 2011-01-23 MED ORDER — ACETAMINOPHEN-CODEINE #3 300-30 MG PO TABS: 1.0000 | ORAL_TABLET | ORAL | Status: AC | PRN

## 2011-01-23 MED ORDER — FUROSEMIDE 40 MG PO TABS: ORAL_TABLET | ORAL | Status: DC

## 2011-01-23 NOTE — Progress Notes (Signed)
  Subjective:  C/o feeling tired with mild incisional pain.  Objective:  Vital Signs in the last 24 hours: Temp:  [97.4 F (36.3 C)-98.1 F (36.7 C)] 98 F (36.7 C) (11/13 0500) Pulse Rate:  [18-77] 77  (11/13 0500) Resp:  [18-19] 19  (11/13 0500) BP: (90-98)/(43-65) 98/65 mmHg (11/13 0500) SpO2:  [91 %-100 %] 96 % (11/13 0838)  Intake/Output from previous day: 11/12 0701 - 11/13 0700 In: 600 [P.O.:600] Out: 900 [Urine:900] Intake/Output from this shift:    Physical Exam: Chronically ill appearing NAD HEENT: Unremarkable Neck:  7 cm JVD, no thyromegally Lymphatics:  No adenopathy Back:  No CVA tenderness Lungs:  Clear with no wheezes. Minimal hematoma at ICD insertion site HEART:  Regular rate rhythm, no murmurs, no rubs, no clicks Abd:  Flat, positive bowel sounds, no organomegally, no rebound, no guarding Ext:  2 plus pulses, no edema, no cyanosis, no clubbing Skin:  No rashes no nodules Neuro:  CN II through XII intact, motor grossly intact  Lab Results: No results found for this basename: WBC:2,HGB:2,PLT:2 in the last 72 hours No results found for this basename: NA:2,K:2,CL:2,CO2:2,GLUCOSE:2,BUN:2,CREATININE:2 in the last 72 hours No results found for this basename: TROPONINI:2,CK,MB:2 in the last 72 hours Hepatic Function Panel No results found for this basename: PROT,ALBUMIN,AST,ALT,ALKPHOS,BILITOT,BILIDIR,IBILI in the last 72 hours No results found for this basename: CHOL in the last 72 hours No results found for this basename: PROTIME in the last 72 hours  Imaging: cxr - reviewed. No PTX.  Cardiac Studies: 12 lead ECG - NSR with BiV pacing. QRS 135 ms (was 180 ms prior to ICD). Assessment/Plan:  BiV ICD implant - post op day number one. Doing well. Device interogation is satisfactory. Usual followup. Will DC on low dose Tylenol with codiene for 3-4 days. Wound check in 2 weeks.  Chronic systolic CHF - she is well compensated. She will discharge on current  meds. I would like her to take lasix 60 mg daily. She will need BMP when she returns for incision check.   LOS: 1 day    Lewayne Bunting 01/23/2011, 9:26 AM

## 2011-01-23 NOTE — Progress Notes (Signed)
Patient given discharge instructions and questions/concerns answered. Daughter was present for discharge instructions also. Patient will leave in personal vehicle driven by daughter. Volunteer services will take her down to car in wheelchair. Vital signs stable. Respiratory medications given to her. IV discontinued and site unremarkable. Will continue to monitor until she leaves. Debbora Presto 1:39 PM11/13/2012

## 2011-01-23 NOTE — Discharge Summary (Signed)
Patient ID: [name],  MRN: [id number], DOB/AGE: [birth year] 60 y.o.  Admit date: 01/22/2011 Discharge date: 01/23/2011  Primary Care Provider: Oliver Barre Primary Cardiologist: Lewayne Bunting  Discharge Diagnoses Principal Problem:  *Chronic systolic heart failure Active Problems:  HYPERTENSION  COPD (chronic obstructive pulmonary disease)  Cardiomyopathy  CAD (coronary artery disease)      Nonobstructive  Allergies Allergies  Allergen Reactions  . Ivp Dye (Iodinated Diagnostic Agents) Other (See Comments)    Reaction: rash, itching and peel  . Penicillins Other (See Comments)    REACTION: rash, swelling  . Latex Rash    Allergic to latex gloves  . Strawberry Other (See Comments)    Reaction: rash, swelling    Procedures 01/22/2011: Insertion of a Medtronic Consulta CRTD BiV ICD, serial number [id number]   History of Present Illness 60 y/o female with h/o NICM and chronic syst CHF who was recently seen in clinic by Dr. Ladona Ridgel for consideration of ICD placement.  Pt was agreeable and arrangements were made for elective placement.  Hospital Course  Pt presented to the [facility] EP lab on 11/12.  She underwent successful placement of a MDT Consulta Bi-Ventricular ICD.  She tolerated procedure well and post procedure cxr is w/o pneumothorax or other complication.  Pt has had tenderness @ the site of incision for which she will be given a Rx for Tylenol  #3.  We have also uptitrated her home dose of Lasix and plan to check a f/u bmet in approx 2 wks.  Discharge Vitals:  Blood pressure 90/62, pulse 77, temperature 98 F (36.7 C), temperature source Oral, resp. rate 19, height 5\' 5"  (1.651 m), weight 126 lb 2 oz (57.21 kg), SpO2 96.00%.    Labs: None  Disposition:  Follow-up Information    Follow up with [facility] Device 1 on 02/05/2011. [facility] - 10:00)    Contact information:   [facility] [address] [place], [place]  [address] [phone]      Follow up with Lewayne Bunting, MD on 05/07/2011. (10:00)    Contact information:   [address] [place] [address] [phone]          Discharge Medications:  Current Discharge Medication List    START taking these medications   Details  acetaminophen-codeine (TYLENOL #3) 300-30 MG per tablet Take 1 tablet by mouth every 4 (four) hours as needed for pain. Qty: 30 tablet, Refills: 0      CONTINUE these medications which have CHANGED   Details  furosemide (LASIX) 40 MG tablet 1.5 tabs po daily Qty: 45 tablet, Refills: 6      CONTINUE these medications which have NOT CHANGED   Details  albuterol (PROVENTIL,VENTOLIN) 90 MCG/ACT inhaler Inhale 2 puffs into the lungs every 6 (six) hours as needed. For shortness of breath.    aspirin 81 MG EC tablet Take 81 mg by mouth daily.     budesonide-formoterol (SYMBICORT) 160-4.5 MCG/ACT inhaler Inhale 2 puffs into the lungs 2 (two) times daily.     carvedilol (COREG) 6.25 MG tablet Take 3.125 mg by mouth 2 (two) times daily with a meal. 1/2 tab po bid    Cholecalciferol (VITAMIN D3) 1000 UNITS CAPS Take 1,000 Units by mouth daily.     fluticasone (VERAMYST) 27.5 MCG/SPRAY nasal spray Place 2 sprays into the nose daily.     levothyroxine (SYNTHROID, LEVOTHROID) 150 MCG tablet Take 150 mcg  by mouth daily before breakfast.     lisinopril (PRINIVIL,ZESTRIL) 20 MG tablet Take 10 mg by mouth daily. 1/2 tablet by mouth daily     spironolactone (ALDACTONE) 50 MG tablet Take 50 mg by mouth daily.     tiotropium (SPIRIVA) 18 MCG inhalation capsule Place 18 mcg into inhaler and inhale every morning.       STOP taking these medications     fexofenadine (ALLEGRA) 180 MG tablet      predniSONE (DELTASONE) 10 MG tablet      triamcinolone (KENALOG) 0.025 % cream         Outstanding Labs/Studies:  BASIC METABOLIC PANEL ON F/U IN DEVICE CLINIC  Duration of  Discharge Encounter: Greater than 30 minutes including physician time.  SignedNicolasa Ducking NP 01/23/2011, 12:09 PM

## 2011-02-05 ENCOUNTER — Encounter: Payer: Self-pay | Admitting: Internal Medicine

## 2011-02-05 ENCOUNTER — Other Ambulatory Visit (INDEPENDENT_AMBULATORY_CARE_PROVIDER_SITE_OTHER): Payer: BC Managed Care – PPO | Admitting: *Deleted

## 2011-02-05 ENCOUNTER — Ambulatory Visit (INDEPENDENT_AMBULATORY_CARE_PROVIDER_SITE_OTHER): Payer: BC Managed Care – PPO | Admitting: *Deleted

## 2011-02-05 DIAGNOSIS — I5031 Acute diastolic (congestive) heart failure: Secondary | ICD-10-CM

## 2011-02-05 DIAGNOSIS — I428 Other cardiomyopathies: Secondary | ICD-10-CM

## 2011-02-05 DIAGNOSIS — I509 Heart failure, unspecified: Secondary | ICD-10-CM

## 2011-02-05 LAB — ICD DEVICE OBSERVATION
AL IMPEDENCE ICD: 494 Ohm
ATRIAL PACING ICD: 31.26 pct
BAMS-0001: 170 {beats}/min
CHARGE TIME: 3.633 s
FVT: 0
LV LEAD THRESHOLD: 2.75 V
PACEART VT: 0
TOT-0001: 1
TOT-0002: 0
TZAT-0001ATACH: 3
TZAT-0001FASTVT: 1
TZAT-0002ATACH: NEGATIVE
TZAT-0002ATACH: NEGATIVE
TZAT-0012ATACH: 150 ms
TZAT-0012FASTVT: 170 ms
TZAT-0012SLOWVT: 170 ms
TZAT-0018ATACH: NEGATIVE
TZAT-0018FASTVT: NEGATIVE
TZAT-0019ATACH: 6 V
TZAT-0019ATACH: 6 V
TZAT-0019SLOWVT: 8 V
TZAT-0020ATACH: 1.5 ms
TZAT-0020ATACH: 1.5 ms
TZAT-0020ATACH: 1.5 ms
TZAT-0020FASTVT: 1.5 ms
TZAT-0020SLOWVT: 1.5 ms
TZON-0003SLOWVT: 330 ms
TZON-0003VSLOWVT: 360 ms
TZON-0004SLOWVT: 16
TZST-0001ATACH: 5
TZST-0001ATACH: 6
TZST-0001FASTVT: 2
TZST-0001FASTVT: 4
TZST-0001FASTVT: 6
TZST-0001SLOWVT: 2
TZST-0001SLOWVT: 5
TZST-0001SLOWVT: 6
TZST-0002ATACH: NEGATIVE
TZST-0002FASTVT: NEGATIVE
TZST-0002FASTVT: NEGATIVE
TZST-0002FASTVT: NEGATIVE
TZST-0002SLOWVT: NEGATIVE
TZST-0002SLOWVT: NEGATIVE
VENTRICULAR PACING ICD: 99.89 pct

## 2011-02-05 LAB — BASIC METABOLIC PANEL
BUN: 16 mg/dL (ref 6–23)
CO2: 29 mEq/L (ref 19–32)
Chloride: 104 mEq/L (ref 96–112)
Creatinine, Ser: 1.1 mg/dL (ref 0.4–1.2)
Glucose, Bld: 79 mg/dL (ref 70–99)

## 2011-02-05 NOTE — Progress Notes (Signed)
icd check in clinic  

## 2011-02-19 ENCOUNTER — Encounter: Payer: Self-pay | Admitting: Gastroenterology

## 2011-02-26 ENCOUNTER — Encounter: Payer: Self-pay | Admitting: Cardiology

## 2011-02-26 ENCOUNTER — Ambulatory Visit (INDEPENDENT_AMBULATORY_CARE_PROVIDER_SITE_OTHER): Payer: BC Managed Care – PPO | Admitting: Cardiology

## 2011-02-26 DIAGNOSIS — I5022 Chronic systolic (congestive) heart failure: Secondary | ICD-10-CM

## 2011-02-26 DIAGNOSIS — I428 Other cardiomyopathies: Secondary | ICD-10-CM

## 2011-02-26 DIAGNOSIS — Z95810 Presence of automatic (implantable) cardiac defibrillator: Secondary | ICD-10-CM | POA: Insufficient documentation

## 2011-02-26 NOTE — Progress Notes (Signed)
FAO:[id number] female with a history of dilated cardiomyopathy with an ejection fraction of 15% and chronic systolic heart failure for fu. She was in the hospital in March with severe hypothyroidism and congestive heart failure. She was admitted again in 5/12 with a/c systolic CHF. Her TSH has normalized with treatment of her hypothyroidism. Followup echocardiogram in June 2012 demonstrated continued LV dysfunction with an EF of 15%. Cardiac catheterization was performed 8/15: EF 10-15%, circumflex calcified without obstructive CAD, proximal LAD 20%, mid LAD 20%, proximal RCA 20%, mid RCA 20%. She was felt to have mild monitor to CAD and a nonischemic myopathy. She had ICD placed 11/12. Since I last saw her, she has dyspnea after walking 3 blocks. No orthopnea, PND, pedal edema, syncope or chest pain. She has mild dizziness with standing.   Current Outpatient Prescriptions  Medication Sig Dispense Refill  . albuterol (PROVENTIL,VENTOLIN) 90 MCG/ACT inhaler Inhale 2 puffs into the lungs every 6 (six) hours as needed. For shortness of breath.      Marland Kitchen aspirin 81 MG EC tablet Take 81 mg by mouth daily.       . budesonide-formoterol (SYMBICORT) 160-4.5 MCG/ACT inhaler Inhale 2 puffs into the lungs 2 (two) times daily.       . carvedilol (COREG) 6.25 MG tablet Take 3.125 mg by mouth 2 (two) times daily with a meal. 1/2 tab po bid      . Cholecalciferol (VITAMIN D3) 1000 UNITS CAPS Take 1,000 Units by mouth daily.       . fexofenadine (ALLEGRA) 180 MG tablet Take 180 mg by mouth daily.        . fluticasone (VERAMYST) 27.5 MCG/SPRAY nasal spray Place 2 sprays into the nose daily.       . furosemide (LASIX) 40 MG tablet 1.5 tabs po daily  45 tablet  6  . levothyroxine (SYNTHROID, LEVOTHROID) 150 MCG tablet Take 150 mcg by mouth daily before breakfast.       . lisinopril (PRINIVIL,ZESTRIL) 20 MG tablet Take 10 mg by mouth daily. 1/2 tablet by mouth daily       . spironolactone (ALDACTONE) 50 MG tablet Take 50  mg by mouth daily.       Marland Kitchen tiotropium (SPIRIVA) 18 MCG inhalation capsule Place 18 mcg into inhaler and inhale every morning.          Past Medical History  Diagnosis Date  . Hypertension   . Genital warts     hx  . Anemia, iron deficiency   . Blood transfusion complicating pregnancy     after first child at 56 yo  . DJD (degenerative joint disease)     hands  . Cervical disc disease     with recurrent radicular pain, Dr. Montez Morita  . COPD (chronic obstructive pulmonary disease) 06/14/2010  . ALLERGIC RHINITIS 03/17/2008  . GOITER, MULTINODULAR 12/23/2009  . Cardiomyopathy     a. remote h/o cath with reported nonobs CAD; b. echo 5/12: EF 15%, mild LVH, mild MR, LAE, mod pericardial effusion with increased filling pressures  . Systolic CHF   . Hypothyroid   . CAD (coronary artery disease)     Nonobstructive by cardiac catheter 8/12:EF 10-15%, circumflex calcified without obstructive CAD, proximal LAD 20%, mid LAD 20%, proximal RCA 20%, mid RCA 20%.    Past Surgical History  Procedure Date  . Cholecystectomy   . Cardiac catheterization 1995    with "timy blockage" [facility] Dr. Daisy Floro    History   Social History  .  Marital Status: Married    Spouse Name: N/A    Number of Children: 3  . Years of Education: N/A   Occupational History  .     Social History Main Topics  . Smoking status: Former Smoker -- 0.2 packs/day  . Smokeless tobacco: Not on file   Comment: very light, occasional stopped she states now  . Alcohol Use: No  . Drug Use: No  . Sexually Active: No   Other Topics Concern  . Not on file   Social History Narrative  . No narrative on file    ROS: no fevers or chills, productive cough, hemoptysis, dysphasia, odynophagia, melena, hematochezia, dysuria, hematuria, rash, seizure activity, orthopnea, PND, pedal edema, claudication. Remaining systems are negative.  Physical Exam: Well-developed well-nourished in no acute distress.  Skin is warm and dry.   HEENT is normal.  Neck is supple. No thyromegaly.  Chest is clear to auscultation with normal expansion. ICD in left chest without evidence of infection. Cardiovascular exam is regular rate and rhythm.  Abdominal exam nontender or distended. No masses palpated. Extremities show no edema. neuro grossly intact

## 2011-02-26 NOTE — Assessment & Plan Note (Signed)
Patient with nonischemic cardiomyopathy. Continue present medications. I am hesitant to advance ACE inhibitor or beta blocker as she does have some dizziness with standing and blood pressure is borderline. Check potassium and renal function.

## 2011-02-26 NOTE — Assessment & Plan Note (Signed)
Euvolemic on examination. Continue present medications.

## 2011-02-26 NOTE — Patient Instructions (Signed)
Your physician wants you to follow-up in: 6 MONTHS You will receive a reminder letter in the mail two months in advance. If you don't receive a letter, please call our office to schedule the follow-up appointment. 

## 2011-02-26 NOTE — Assessment & Plan Note (Signed)
Management per electrophysiology. 

## 2011-04-14 ENCOUNTER — Other Ambulatory Visit: Payer: Self-pay | Admitting: Internal Medicine

## 2011-05-07 ENCOUNTER — Encounter: Payer: Self-pay | Admitting: Internal Medicine

## 2011-05-07 ENCOUNTER — Ambulatory Visit (INDEPENDENT_AMBULATORY_CARE_PROVIDER_SITE_OTHER): Payer: BC Managed Care – PPO | Admitting: Internal Medicine

## 2011-05-07 DIAGNOSIS — I2510 Atherosclerotic heart disease of native coronary artery without angina pectoris: Secondary | ICD-10-CM

## 2011-05-07 DIAGNOSIS — Z95810 Presence of automatic (implantable) cardiac defibrillator: Secondary | ICD-10-CM

## 2011-05-07 DIAGNOSIS — I5022 Chronic systolic (congestive) heart failure: Secondary | ICD-10-CM

## 2011-05-07 DIAGNOSIS — I428 Other cardiomyopathies: Secondary | ICD-10-CM

## 2011-05-07 LAB — ICD DEVICE OBSERVATION
AL IMPEDENCE ICD: 494 Ohm
BAMS-0001: 170 {beats}/min
CHARGE TIME: 3.633 s
LV LEAD IMPEDENCE ICD: 285 Ohm
PACEART VT: 0
TOT-0001: 1
TOT-0002: 0
TOT-0006: 20121112000000
TZAT-0001ATACH: 3
TZAT-0001FASTVT: 1
TZAT-0002ATACH: NEGATIVE
TZAT-0012ATACH: 150 ms
TZAT-0012SLOWVT: 170 ms
TZAT-0019ATACH: 6 V
TZAT-0020ATACH: 1.5 ms
TZAT-0020ATACH: 1.5 ms
TZAT-0020SLOWVT: 1.5 ms
TZON-0003SLOWVT: 330 ms
TZON-0003VSLOWVT: 360 ms
TZON-0004SLOWVT: 16
TZON-0005SLOWVT: 12
TZST-0001ATACH: 6
TZST-0001FASTVT: 2
TZST-0001FASTVT: 3
TZST-0001FASTVT: 4
TZST-0001SLOWVT: 2
TZST-0001SLOWVT: 3
TZST-0002ATACH: NEGATIVE
TZST-0002ATACH: NEGATIVE
TZST-0002FASTVT: NEGATIVE
TZST-0002FASTVT: NEGATIVE
TZST-0002SLOWVT: NEGATIVE
TZST-0002SLOWVT: NEGATIVE
VENTRICULAR PACING ICD: 99.83 pct

## 2011-05-07 NOTE — Assessment & Plan Note (Signed)
Her symptoms are currently class 1-2. She will continue her current medical therapy and maintain a low-sodium diet.

## 2011-05-07 NOTE — Assessment & Plan Note (Signed)
Her device is working normally. We'll plan to recheck in several months. 

## 2011-05-07 NOTE — Patient Instructions (Addendum)
Your physician recommends that you schedule a follow-up appointment in: 3 months with device clinic   Your physician wants you to follow-up in: 12 months with Dr Taylor You will receive a reminder letter in the mail two months in advance. If you don't receive a letter, please call our office to schedule the follow-up appointment.   

## 2011-05-07 NOTE — Progress Notes (Signed)
HPI [name] returns today for followup. She is a very pleasant 61 year old woman with a nonischemic cardiomyopathy, chronic systolic heart failure, status post biventricular ICD implantation. She denies chest pain, shortness of breath, or peripheral edema. Her heart failure symptoms are much improved after device implantation. Allergies  Allergen Reactions  . Ivp Dye (Iodinated Diagnostic Agents) Other (See Comments)    Reaction: rash, itching and peel  . Penicillins Other (See Comments)    REACTION: rash, swelling  . Latex Rash    Allergic to latex gloves  . Strawberry Other (See Comments)    Reaction: rash, swelling     Current Outpatient Prescriptions  Medication Sig Dispense Refill  . aspirin 81 MG EC tablet Take 81 mg by mouth daily.       . budesonide-formoterol (SYMBICORT) 160-4.5 MCG/ACT inhaler Inhale 2 puffs into the lungs 2 (two) times daily.       . carvedilol (COREG) 6.25 MG tablet Take 3.125 mg by mouth 2 (two) times daily with a meal. 1/2 tab po bid      . fexofenadine (ALLEGRA) 180 MG tablet Take 180 mg by mouth daily.        . fluticasone (VERAMYST) 27.5 MCG/SPRAY nasal spray Place 2 sprays into the nose daily.       . furosemide (LASIX) 40 MG tablet Take 40 mg by mouth daily.      Marland Kitchen levothyroxine (SYNTHROID, LEVOTHROID) 150 MCG tablet Take 150 mcg by mouth daily before breakfast.       . lisinopril (PRINIVIL,ZESTRIL) 20 MG tablet Take 10 mg by mouth daily. 1/2 tablet by mouth daily       . spironolactone (ALDACTONE) 50 MG tablet Take 50 mg by mouth daily.       Marland Kitchen tiotropium (SPIRIVA) 18 MCG inhalation capsule Place 18 mcg into inhaler and inhale every morning.       . Cholecalciferol (VITAMIN D3) 1000 UNITS CAPS Take 1,000 Units by mouth daily.          Past Medical History  Diagnosis Date  . Hypertension   . Genital warts     hx  . Anemia, iron deficiency   . Blood transfusion complicating pregnancy     after first child at 35 yo  . DJD (degenerative joint  disease)     hands  . Cervical disc disease     with recurrent radicular pain, Dr. Montez Morita  . COPD (chronic obstructive pulmonary disease) 06/14/2010  . ALLERGIC RHINITIS 03/17/2008  . GOITER, MULTINODULAR 12/23/2009  . Cardiomyopathy     a. remote h/o cath with reported nonobs CAD; b. echo 5/12: EF 15%, mild LVH, mild MR, LAE, mod pericardial effusion with increased filling pressures  . Systolic CHF   . Hypothyroid   . CAD (coronary artery disease)     Nonobstructive by cardiac catheter 8/12:EF 10-15%, circumflex calcified without obstructive CAD, proximal LAD 20%, mid LAD 20%, proximal RCA 20%, mid RCA 20%.    ROS:   All systems reviewed and negative except as noted in the HPI.   Past Surgical History  Procedure Date  . Cholecystectomy   . Cardiac catheterization 1995    with "timy blockage" [facility] Dr. Daisy Floro     Family History  Problem Relation Age of Onset  . Stroke Mother   . Heart disease Mother   . Hypertension Mother   . Diabetes Mother   . Stroke Father   . Heart disease Father   . Hypertension Father   .  Diabetes Father   . Cancer Sister     colon  . Heart disease Sister     pacemaker  . Hypertension Sister   . Depression Sister   . Diabetes Sister   . Thyroid disease Sister     uncertain type  . Transient ischemic attack Maternal Grandfather   . Alcohol abuse Other   . Malignant hyperthermia Other      History   Social History  . Marital Status: Married    Spouse Name: N/A    Number of Children: 3  . Years of Education: N/A   Occupational History  .     Social History Main Topics  . Smoking status: Former Smoker -- 0.2 packs/day  . Smokeless tobacco: Not on file   Comment: very light, occasional stopped she states now  . Alcohol Use: No  . Drug Use: No  . Sexually Active: No   Other Topics Concern  . Not on file   Social History Narrative  . No narrative on file     BP 120/72  Pulse 70  Wt 61.871 kg (136 lb 6.4  oz)  Physical Exam:  Well appearing middle-aged woman, NAD HEENT: Unremarkable Neck:  No JVD, no thyromegally Lungs:  Clear with no wheezes, rales, or rhonchi. HEART:  Regular rate rhythm, no murmurs, no rubs, no clicks Abd:  soft, positive bowel sounds, no organomegally, no rebound, no guarding Ext:  2 plus pulses, no edema, no cyanosis, no clubbing Skin:  No rashes no nodules Neuro:  CN II through XII intact, motor grossly intact  DEVICE  Normal device function.  See PaceArt for details.   Assess/Plan:

## 2011-05-08 ENCOUNTER — Other Ambulatory Visit: Payer: Self-pay | Admitting: *Deleted

## 2011-05-08 MED ORDER — ALBUTEROL SULFATE HFA 108 (90 BASE) MCG/ACT IN AERS: 2.0000 | INHALATION_SPRAY | Freq: Four times a day (QID) | RESPIRATORY_TRACT | Status: DC | PRN

## 2011-05-08 NOTE — Telephone Encounter (Signed)
Pt needs refill of Albuterol sent to [facility].

## 2011-06-13 ENCOUNTER — Ambulatory Visit (INDEPENDENT_AMBULATORY_CARE_PROVIDER_SITE_OTHER): Payer: BC Managed Care – PPO | Admitting: Internal Medicine

## 2011-06-13 ENCOUNTER — Encounter: Payer: Self-pay | Admitting: Internal Medicine

## 2011-06-13 VITALS — BP 110/78 | HR 68 | Temp 97.5°F | Ht 65.0 in | Wt 135.1 lb

## 2011-06-13 DIAGNOSIS — B370 Candidal stomatitis: Secondary | ICD-10-CM

## 2011-06-13 DIAGNOSIS — M25511 Pain in right shoulder: Secondary | ICD-10-CM

## 2011-06-13 DIAGNOSIS — I10 Essential (primary) hypertension: Secondary | ICD-10-CM

## 2011-06-13 DIAGNOSIS — J069 Acute upper respiratory infection, unspecified: Secondary | ICD-10-CM | POA: Insufficient documentation

## 2011-06-13 DIAGNOSIS — M25512 Pain in left shoulder: Secondary | ICD-10-CM | POA: Insufficient documentation

## 2011-06-13 DIAGNOSIS — Z0000 Encounter for general adult medical examination without abnormal findings: Secondary | ICD-10-CM

## 2011-06-13 DIAGNOSIS — M25519 Pain in unspecified shoulder: Secondary | ICD-10-CM

## 2011-06-13 MED ORDER — NYSTATIN 100000 UNIT/ML MT SUSP: 500000.0000 [IU] | Freq: Four times a day (QID) | OROMUCOSAL | Status: AC

## 2011-06-13 MED ORDER — AZITHROMYCIN 250 MG PO TABS: ORAL_TABLET | ORAL | Status: AC

## 2011-06-13 MED ORDER — NAPROXEN 500 MG PO TABS: 500.0000 mg | ORAL_TABLET | Freq: Two times a day (BID) | ORAL | Status: DC

## 2011-06-13 NOTE — Patient Instructions (Signed)
Take all new medications as prescribed - the antibiotic, solution medicine for thrush, and pain medication You will be contacted regarding the referral for: orthopedic Continue all other medications as before Please keep your appointments with your specialists as you have planned - cardiology Please return in 6 mo with Lab testing done 3-5 days before

## 2011-06-17 ENCOUNTER — Encounter: Payer: Self-pay | Admitting: Internal Medicine

## 2011-06-17 NOTE — Assessment & Plan Note (Signed)
Mild to mod, for antibx course,  to f/u any worsening symptoms or concerns 

## 2011-06-17 NOTE — Assessment & Plan Note (Signed)
Mild to mod, for nsaid course,  Refer orthopedic, to f/u any worsening symptoms or concerns

## 2011-06-17 NOTE — Progress Notes (Signed)
Subjective:    Patient ID: [name], female    DOB: [birth year], 61 y.o.   MRN: [id number]  HPI   Here with 3 days acute onset fever, facial pain, pressure, general weakness and malaise, and with mild ST, but little to no cough and Pt denies chest pain, increased sob or doe, wheezing, orthopnea, PND, increased LE swelling, palpitations, dizziness or syncope.  Pt denies new neurological symptoms such as new headache, or facial or extremity weakness or numbness   Pt denies polydipsia, polyuria.  Does have thrushlike rash to mouth for over a wk.  Also has 1 wk right shoulder pain, mild, non radiating, worse to abduction and other movement, no trauma, fever , nothing makes better.   Past Medical History  Diagnosis Date  . Hypertension   . Genital warts     hx  . Anemia, iron deficiency   . Blood transfusion complicating pregnancy     after first child at 48 yo  . DJD (degenerative joint disease)     hands  . Cervical disc disease     with recurrent radicular pain, Dr. Montez Morita  . COPD (chronic obstructive pulmonary disease) 06/14/2010  . ALLERGIC RHINITIS 03/17/2008  . GOITER, MULTINODULAR 12/23/2009  . Cardiomyopathy     a. remote h/o cath with reported nonobs CAD; b. echo 5/12: EF 15%, mild LVH, mild MR, LAE, mod pericardial effusion with increased filling pressures  . Systolic CHF   . Hypothyroid   . CAD (coronary artery disease)     Nonobstructive by cardiac catheter 8/12:EF 10-15%, circumflex calcified without obstructive CAD, proximal LAD 20%, mid LAD 20%, proximal RCA 20%, mid RCA 20%.   Past Surgical History  Procedure Date  . Cholecystectomy   . Cardiac catheterization 1995    with "timy blockage" [facility] Dr. Daisy Floro    reports that she has quit smoking. She does not have any smokeless tobacco history on file. She reports that she does not drink alcohol or use illicit drugs. family history includes Alcohol abuse in her other; Cancer in her sister; Depression in her  sister; Diabetes in her father, mother, and sister; Heart disease in her father, mother, and sister; Hypertension in her father, mother, and sister; Malignant hyperthermia in her other; Stroke in her father and mother; Thyroid disease in her sister; and Transient ischemic attack in her maternal grandfather. Allergies  Allergen Reactions  . Ivp Dye (Iodinated Diagnostic Agents) Other (See Comments)    Reaction: rash, itching and peel  . Penicillins Other (See Comments)    REACTION: rash, swelling  . Latex Rash    Allergic to latex gloves  . Strawberry Other (See Comments)    Reaction: rash, swelling   Current Outpatient Prescriptions on File Prior to Visit  Medication Sig Dispense Refill  . albuterol (PROVENTIL HFA;VENTOLIN HFA) 108 (90 BASE) MCG/ACT inhaler Inhale 2 puffs into the lungs every 6 (six) hours as needed for wheezing.  1 Inhaler  2  . aspirin 81 MG EC tablet Take 81 mg by mouth daily.       . budesonide-formoterol (SYMBICORT) 160-4.5 MCG/ACT inhaler Inhale 2 puffs into the lungs 2 (two) times daily.       . carvedilol (COREG) 6.25 MG tablet Take 3.125 mg by mouth 2 (two) times daily with a meal. 1/2 tab po bid      . Cholecalciferol (VITAMIN D3) 1000 UNITS CAPS Take 1,000 Units by mouth daily.       . fexofenadine (ALLEGRA) 180  MG tablet Take 180 mg by mouth daily.        . fluticasone (VERAMYST) 27.5 MCG/SPRAY nasal spray Place 2 sprays into the nose daily.       . furosemide (LASIX) 40 MG tablet Take 40 mg by mouth daily.      Marland Kitchen levothyroxine (SYNTHROID, LEVOTHROID) 150 MCG tablet Take 150 mcg by mouth daily before breakfast.       . lisinopril (PRINIVIL,ZESTRIL) 20 MG tablet Take 10 mg by mouth daily. 1/2 tablet by mouth daily       . spironolactone (ALDACTONE) 50 MG tablet Take 50 mg by mouth daily.       Marland Kitchen tiotropium (SPIRIVA) 18 MCG inhalation capsule Place 18 mcg into inhaler and inhale every morning.        Review of SystemsConstitutional: Negative for diaphoresis and  unexpected weight change.  HENT: Negative for drooling and tinnitus.   Eyes: Negative for photophobia and visual disturbance.  Respiratory: Negative for choking and stridor.   Gastrointestinal: Negative for vomiting and blood in stool.  Genitourinary: Negative for hematuria and decreased urine volume.  Musculoskeletal: Negative for gait problem.  Neurological: Negative for tremors and numbness.    Physical Exam BP 110/78  Pulse 68  Temp(Src) 97.5 F (36.4 C) (Oral)  Ht 5\' 5"  (1.651 m)  Wt 135 lb 2 oz (61.292 kg)  BMI 22.49 kg/m2  SpO2 98% Physical Exam  VS noted, mild ill Constitutional: Pt appears well-developed and well-nourished.  HENT: Head: Normocephalic.  Right Ear: External ear normal.  Left Ear: External ear normal.  Bilat tm's mild erythema.  Sinus nontender.  Pharynx mild erythema Eyes: Conjunctivae and EOM are normal. Pupils are equal, round, and reactive to light.  Neck: Normal range of motion. Neck supple.  Cardiovascular: Normal rate and regular rhythm.   Pulmonary/Chest: Effort normal and breath sounds normal.  Neurological: Pt is alert. No cranial nerve deficit. motor/sens./dtr intact Right shoulder diffuse mild tender, no rash, decreased ROM to 100 degrees forward elevation Skin: Skin is warm. No erythema.  Psychiatric: Pt behavior is normal. Thought content normal.     Assessment & Plan:

## 2011-06-17 NOTE — Assessment & Plan Note (Signed)
stable overall by hx and exam, most recent data reviewed with pt, and pt to continue medical treatment as before  BP Readings from Last 3 Encounters:  06/13/11 110/78  05/07/11 120/72  02/26/11 109/74

## 2011-08-08 ENCOUNTER — Ambulatory Visit (INDEPENDENT_AMBULATORY_CARE_PROVIDER_SITE_OTHER): Payer: BC Managed Care – PPO | Admitting: *Deleted

## 2011-08-08 ENCOUNTER — Encounter: Payer: Self-pay | Admitting: Internal Medicine

## 2011-08-08 DIAGNOSIS — I428 Other cardiomyopathies: Secondary | ICD-10-CM

## 2011-08-08 DIAGNOSIS — I5022 Chronic systolic (congestive) heart failure: Secondary | ICD-10-CM

## 2011-08-08 LAB — ICD DEVICE OBSERVATION
AL IMPEDENCE ICD: 437 Ohm
AL THRESHOLD: 0.5 V
BAMS-0001: 170 {beats}/min
BATTERY VOLTAGE: 3.1619 V
PACEART VT: 0
RV LEAD AMPLITUDE: 12.375 mv
TOT-0002: 0
TOT-0006: 20121112000000
TZAT-0001FASTVT: 1
TZAT-0001SLOWVT: 1
TZAT-0002ATACH: NEGATIVE
TZAT-0002ATACH: NEGATIVE
TZAT-0002FASTVT: NEGATIVE
TZAT-0012ATACH: 150 ms
TZAT-0012ATACH: 150 ms
TZAT-0012FASTVT: 170 ms
TZAT-0012SLOWVT: 170 ms
TZAT-0018FASTVT: NEGATIVE
TZAT-0019ATACH: 6 V
TZAT-0020ATACH: 1.5 ms
TZAT-0020ATACH: 1.5 ms
TZAT-0020ATACH: 1.5 ms
TZON-0003ATACH: 350 ms
TZON-0003VSLOWVT: 360 ms
TZST-0001ATACH: 4
TZST-0001FASTVT: 4
TZST-0001FASTVT: 5
TZST-0001FASTVT: 6
TZST-0001SLOWVT: 3
TZST-0001SLOWVT: 4
TZST-0001SLOWVT: 5
TZST-0002ATACH: NEGATIVE
TZST-0002FASTVT: NEGATIVE
TZST-0002FASTVT: NEGATIVE
TZST-0002SLOWVT: NEGATIVE
TZST-0002SLOWVT: NEGATIVE

## 2011-08-08 NOTE — Progress Notes (Signed)
ICD check with ICM 

## 2011-10-15 ENCOUNTER — Other Ambulatory Visit: Payer: Self-pay | Admitting: Physician Assistant

## 2011-10-18 ENCOUNTER — Telehealth: Payer: Self-pay | Admitting: Cardiology

## 2011-10-18 NOTE — Telephone Encounter (Signed)
New Problem:     I called the patient and was unable to reach them. I left a message on their voicemail with my name, the reason I called, the name of his physician, and a number to call back to schedule their appointment. 

## 2011-10-28 IMAGING — CR DG CHEST 2V
2 series · 2 of 2 positions shown · non-contrast
Comparison: Plain films of the chest 04/21/2009 03/09/2009.

CLINICAL DATA: Cough, shortness of breath and chest pain.

CHEST - 2 VIEW

[view not recorded (1 of 2)]
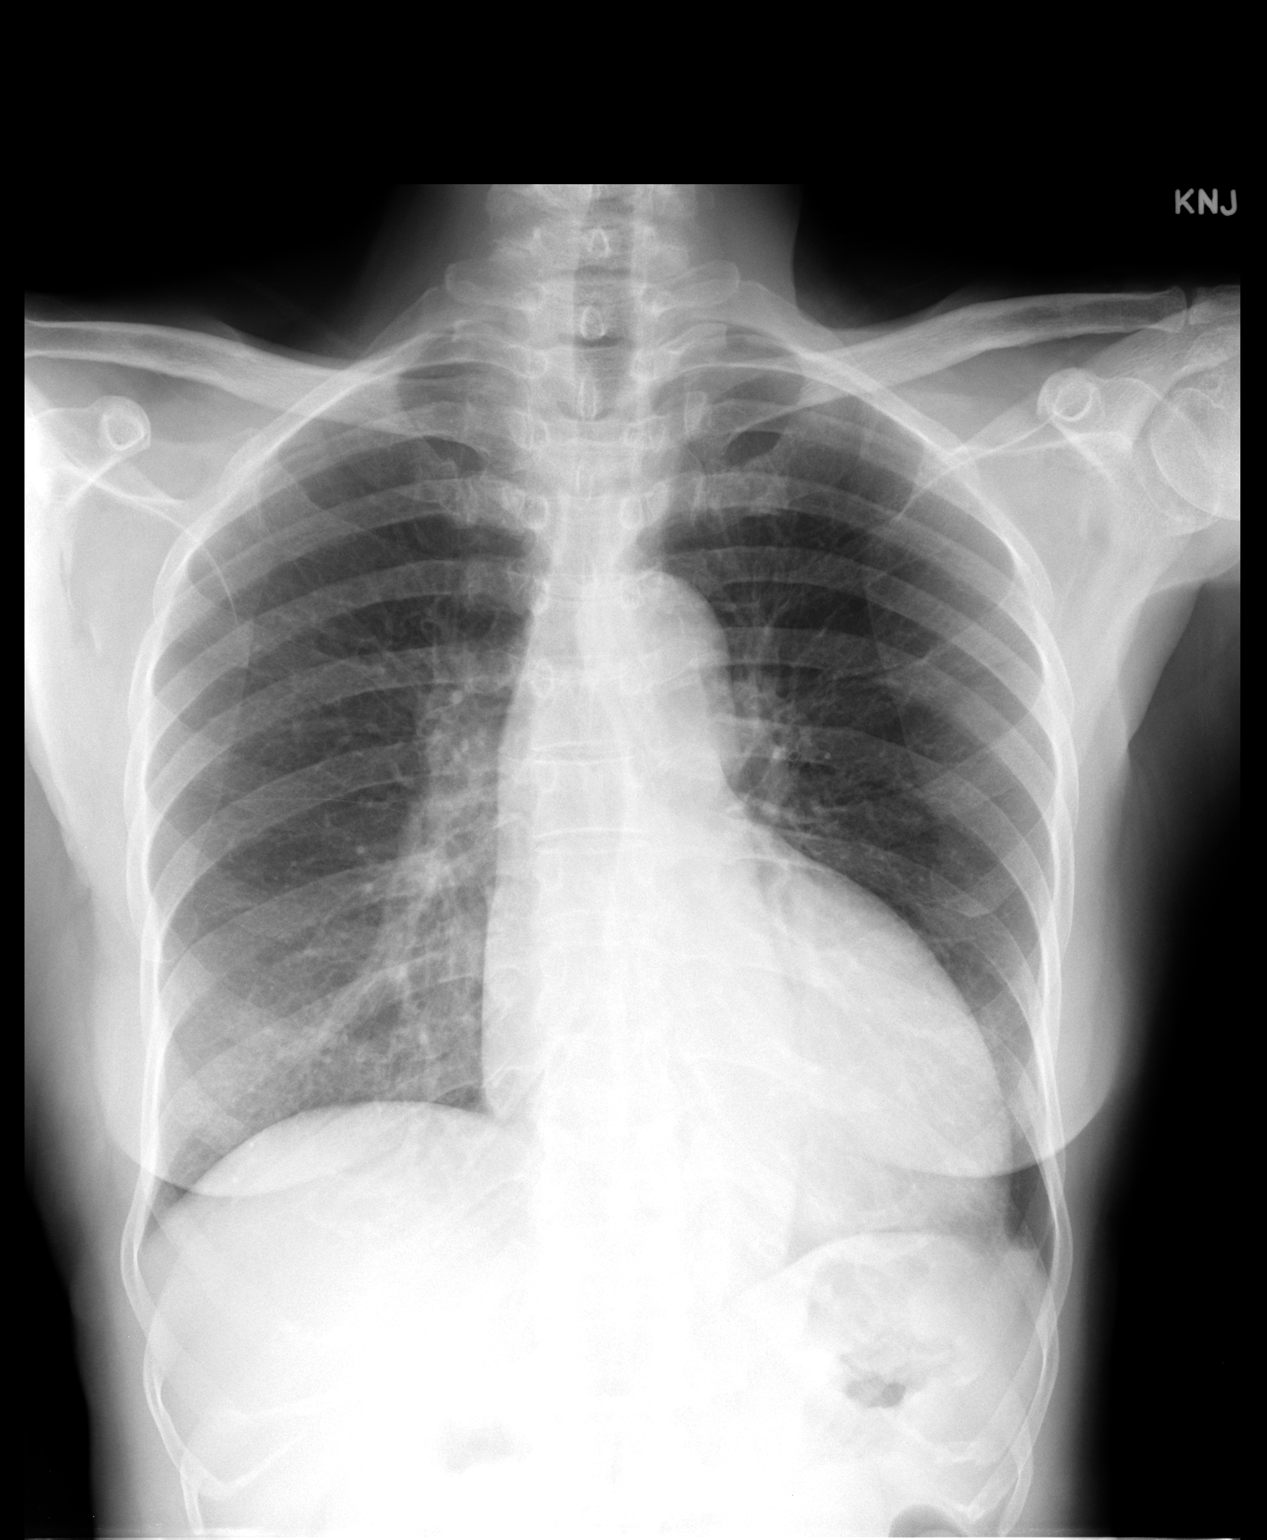

[view not recorded (2 of 2)]
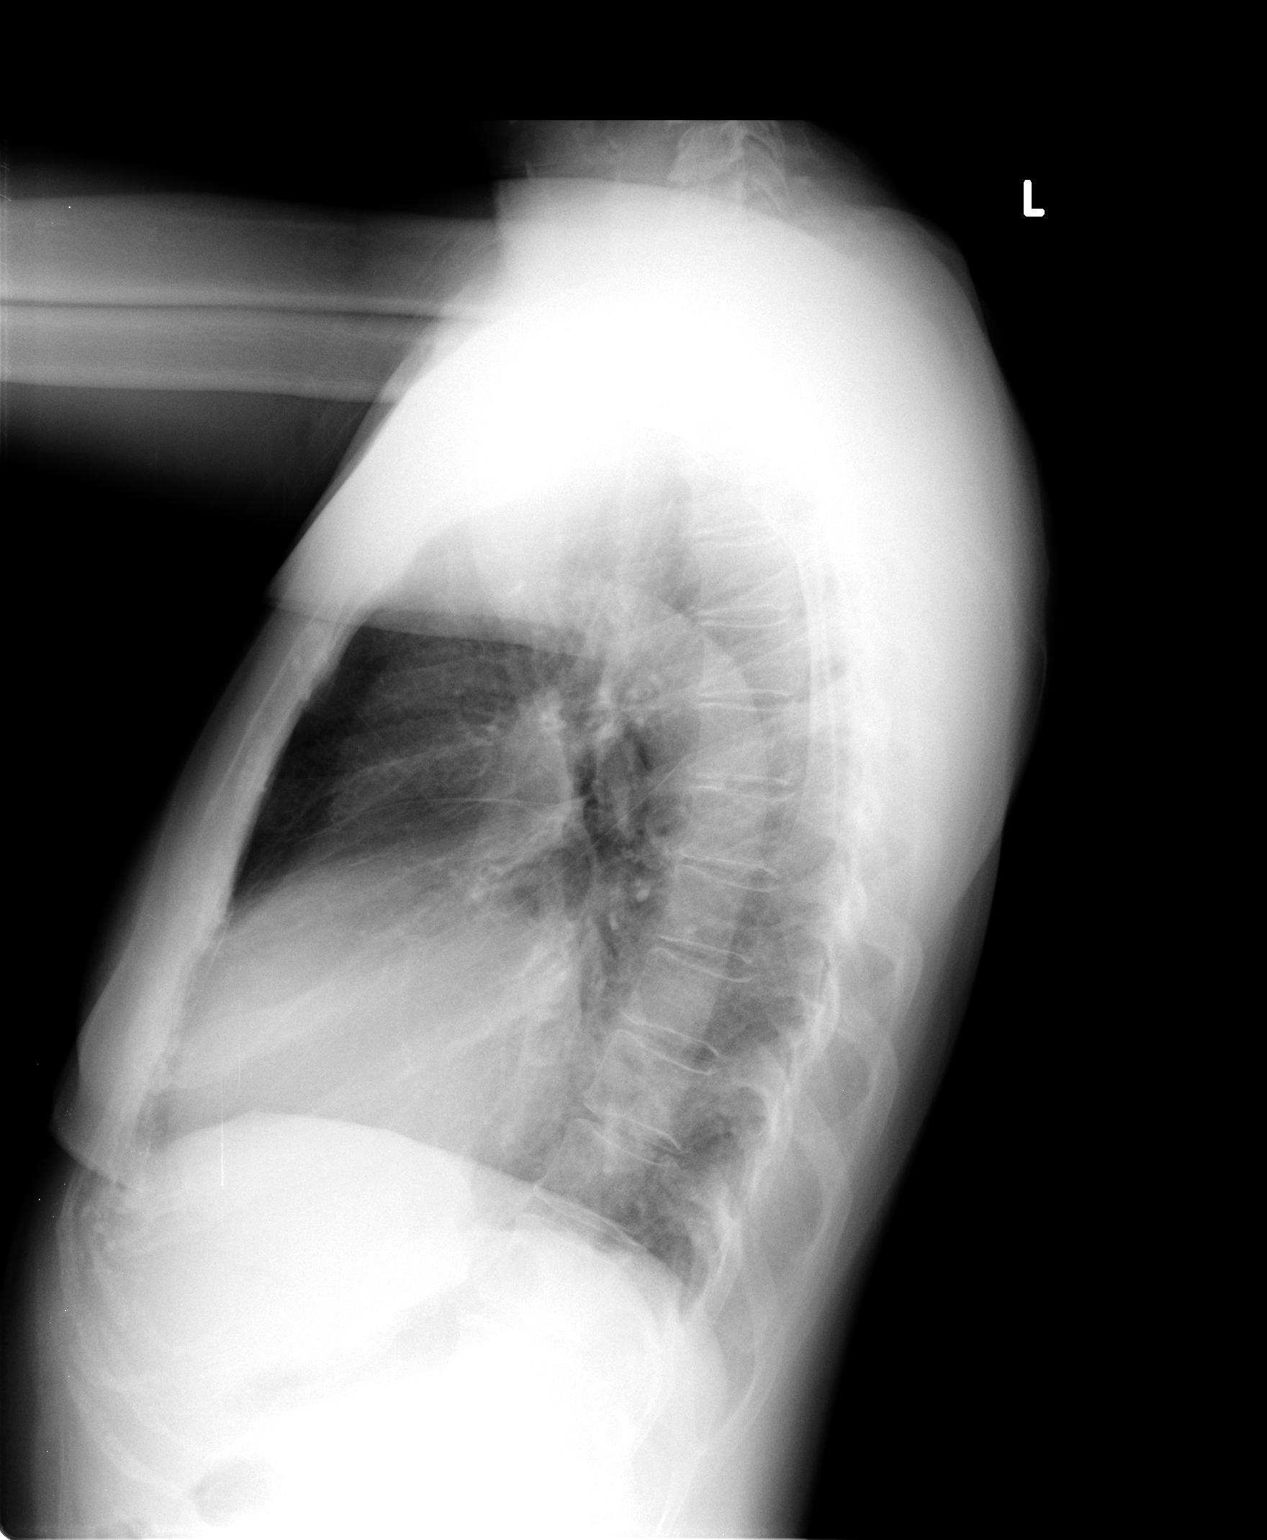

[2 of 2 positions shown; findings below may reference images not displayed]

FINDINGS: There is cardiomegaly but no pulmonary edema.  The lung
are clear.  No pneumothorax or pleural effusion.
IMPRESSION: Cardiomegaly without acute disease.

## 2011-11-07 ENCOUNTER — Encounter: Payer: Self-pay | Admitting: Internal Medicine

## 2011-11-08 ENCOUNTER — Ambulatory Visit (INDEPENDENT_AMBULATORY_CARE_PROVIDER_SITE_OTHER): Payer: BC Managed Care – PPO | Admitting: *Deleted

## 2011-11-08 DIAGNOSIS — I5022 Chronic systolic (congestive) heart failure: Secondary | ICD-10-CM

## 2011-11-08 DIAGNOSIS — I428 Other cardiomyopathies: Secondary | ICD-10-CM

## 2011-11-08 LAB — ICD DEVICE OBSERVATION
AL IMPEDENCE ICD: 437 Ohm
ATRIAL PACING ICD: 24.51 pct
CHARGE TIME: 8.848 s
LV LEAD IMPEDENCE ICD: 380 Ohm
TOT-0002: 0
TOT-0006: 20121112000000
TZAT-0001ATACH: 2
TZAT-0001ATACH: 3
TZAT-0001FASTVT: 1
TZAT-0001SLOWVT: 1
TZAT-0002ATACH: NEGATIVE
TZAT-0002FASTVT: NEGATIVE
TZAT-0012ATACH: 150 ms
TZAT-0012ATACH: 150 ms
TZAT-0018ATACH: NEGATIVE
TZAT-0018ATACH: NEGATIVE
TZAT-0018SLOWVT: NEGATIVE
TZAT-0019ATACH: 6 V
TZAT-0019FASTVT: 8 V
TZAT-0019SLOWVT: 8 V
TZAT-0020ATACH: 1.5 ms
TZAT-0020ATACH: 1.5 ms
TZAT-0020SLOWVT: 1.5 ms
TZON-0003SLOWVT: 330 ms
TZON-0003VSLOWVT: 360 ms
TZON-0004SLOWVT: 16
TZON-0004VSLOWVT: 32
TZON-0005SLOWVT: 12
TZST-0001ATACH: 6
TZST-0001FASTVT: 3
TZST-0001FASTVT: 4
TZST-0001SLOWVT: 2
TZST-0001SLOWVT: 4
TZST-0001SLOWVT: 6
TZST-0002ATACH: NEGATIVE
TZST-0002ATACH: NEGATIVE
TZST-0002FASTVT: NEGATIVE
TZST-0002FASTVT: NEGATIVE
TZST-0002FASTVT: NEGATIVE
TZST-0002SLOWVT: NEGATIVE
TZST-0002SLOWVT: NEGATIVE
VENTRICULAR PACING ICD: 97.34 pct

## 2011-11-08 NOTE — Progress Notes (Signed)
defib check in clinic  

## 2011-11-15 ENCOUNTER — Encounter: Payer: Self-pay | Admitting: Cardiology

## 2011-11-15 ENCOUNTER — Ambulatory Visit (INDEPENDENT_AMBULATORY_CARE_PROVIDER_SITE_OTHER): Payer: BC Managed Care – PPO | Admitting: Cardiology

## 2011-11-15 VITALS — BP 126/80 | HR 70 | Wt 146.0 lb

## 2011-11-15 DIAGNOSIS — I5022 Chronic systolic (congestive) heart failure: Secondary | ICD-10-CM

## 2011-11-15 DIAGNOSIS — Z95810 Presence of automatic (implantable) cardiac defibrillator: Secondary | ICD-10-CM

## 2011-11-15 DIAGNOSIS — I428 Other cardiomyopathies: Secondary | ICD-10-CM

## 2011-11-15 DIAGNOSIS — I429 Cardiomyopathy, unspecified: Secondary | ICD-10-CM

## 2011-11-15 DIAGNOSIS — I10 Essential (primary) hypertension: Secondary | ICD-10-CM

## 2011-11-15 DIAGNOSIS — I2510 Atherosclerotic heart disease of native coronary artery without angina pectoris: Secondary | ICD-10-CM

## 2011-11-15 LAB — BASIC METABOLIC PANEL
BUN: 19 mg/dL (ref 6–23)
Chloride: 102 mEq/L (ref 96–112)
Creatinine, Ser: 1.4 mg/dL — ABNORMAL HIGH (ref 0.4–1.2)
GFR: 47.65 mL/min — ABNORMAL LOW (ref >60.00)
Potassium: 3.5 mEq/L (ref 3.5–5.1)

## 2011-11-15 NOTE — Assessment & Plan Note (Signed)
Blood pressure controlled. Continue present medications. 

## 2011-11-15 NOTE — Assessment & Plan Note (Signed)
euvolemic on examination. Continue present dose of diuretic. Check potassium and renal function. 

## 2011-11-15 NOTE — Patient Instructions (Addendum)
Your physician wants you to follow-up in: 6 MONTHS WITH DR CRENSHAW You will receive a reminder letter in the mail two months in advance. If you don't receive a letter, please call our office to schedule the follow-up appointment.   Your physician recommends that you HAVE LAB WORK TODAY 

## 2011-11-15 NOTE — Assessment & Plan Note (Signed)
Continue aspirin for nonobstructive coronary disease.

## 2011-11-15 NOTE — Progress Notes (Signed)
HPI: Pleasant female with a history of dilated cardiomyopathy with an ejection fraction of 15% and chronic systolic heart failure for fu. She was in the hospital in March 2012 with severe hypothyroidism and congestive heart failure. She was admitted again in 5/12 with a/c systolic CHF. Her TSH had normalized with treatment of her hypothyroidism. Followup echocardiogram in June 2012 demonstrated continued LV dysfunction with an EF of 15%. Cardiac catheterization was performed 10/25/10: EF 10-15%, circumflex calcified without obstructive CAD, proximal LAD 20%, mid LAD 20%, proximal RCA 20%, mid RCA 20%. She was felt to have mild CAD and a nonischemic myopathy. She had ICD placed 11/12. Since I last saw her in Dec 2012, she has mild dyspnea on exertion but no orthopnea, PND, pedal edema, chest pain or syncope. Her ICD has not fired.  Current Outpatient Prescriptions  Medication Sig Dispense Refill  . albuterol (PROVENTIL HFA;VENTOLIN HFA) 108 (90 BASE) MCG/ACT inhaler Inhale 2 puffs into the lungs every 6 (six) hours as needed for wheezing.  1 Inhaler  2  . aspirin 81 MG EC tablet Take 81 mg by mouth daily.       . budesonide-formoterol (SYMBICORT) 160-4.5 MCG/ACT inhaler Inhale 2 puffs into the lungs 2 (two) times daily.       . carvedilol (COREG) 6.25 MG tablet Take 3.125 mg by mouth 2 (two) times daily with a meal. 1/2 tab po bid      . Cholecalciferol (VITAMIN D3) 1000 UNITS CAPS Take 1,000 Units by mouth daily.       . fexofenadine (ALLEGRA) 180 MG tablet Take 180 mg by mouth daily.        . fluticasone (VERAMYST) 27.5 MCG/SPRAY nasal spray Place 2 sprays into the nose daily.       . furosemide (LASIX) 40 MG tablet Take 40 mg by mouth daily.      Marland Kitchen levothyroxine (SYNTHROID, LEVOTHROID) 150 MCG tablet TAKE ONE TABLET BY MOUTH EVERY DAY BEFORE BREAKFAST  30 tablet  9  . lisinopril (PRINIVIL,ZESTRIL) 20 MG tablet 1/2 tablet by mouth daily      . spironolactone (ALDACTONE) 50 MG tablet Take 50 mg by  mouth daily.       Marland Kitchen tiotropium (SPIRIVA) 18 MCG inhalation capsule Place 18 mcg into inhaler and inhale every morning.          Past Medical History  Diagnosis Date  . Hypertension   . Genital warts     hx  . Anemia, iron deficiency   . Blood transfusion complicating pregnancy     after first child at 61 yo  . DJD (degenerative joint disease)     hands  . Cervical disc disease     with recurrent radicular pain, Dr. Montez Morita  . COPD (chronic obstructive pulmonary disease) 06/14/2010  . ALLERGIC RHINITIS 03/17/2008  . GOITER, MULTINODULAR 12/23/2009  . Cardiomyopathy     a. remote h/o cath with reported nonobs CAD; b. echo 5/12: EF 15%, mild LVH, mild MR, LAE, mod pericardial effusion with increased filling pressures  . Systolic CHF   . Hypothyroid   . CAD (coronary artery disease)     Nonobstructive by cardiac catheter 8/12:EF 10-15%, circumflex calcified without obstructive CAD, proximal LAD 20%, mid LAD 20%, proximal RCA 20%, mid RCA 20%.    Past Surgical History  Procedure Date  . Cholecystectomy   . Cardiac catheterization 1995    with "timy blockage" [facility] Dr. Daisy Floro    History   Social History  .  Marital Status: Married    Spouse Name: N/A    Number of Children: 3  . Years of Education: N/A   Occupational History  .     Social History Main Topics  . Smoking status: Former Smoker -- 0.2 packs/day  . Smokeless tobacco: Not on file   Comment: very light, occasional stopped she states now  . Alcohol Use: No  . Drug Use: No  . Sexually Active: No   Other Topics Concern  . Not on file   Social History Narrative  . No narrative on file    ROS: no fevers or chills, productive cough, hemoptysis, dysphasia, odynophagia, melena, hematochezia, dysuria, hematuria, rash, seizure activity, orthopnea, PND, pedal edema, claudication. Remaining systems are negative.  Physical Exam: Well-developed well-nourished in no acute distress.  Skin is warm and dry.    HEENT is normal.  Neck is supple.  Chest is clear to auscultation with normal expansion.  Cardiovascular exam is regular rate and rhythm. 2/6 systolic ejection murmur Abdominal exam nontender or distended. No masses palpated. Extremities show no edema. neuro grossly intact  ECG sinus rhythm with ventricular pacing.

## 2011-11-15 NOTE — Assessment & Plan Note (Signed)
Continue beta blocker and ACE inhibitor. Plan repeat echocardiogram and she returns in 6 months.

## 2011-11-15 NOTE — Assessment & Plan Note (Signed)
Management per electrophysiology. 

## 2011-12-12 ENCOUNTER — Other Ambulatory Visit (INDEPENDENT_AMBULATORY_CARE_PROVIDER_SITE_OTHER): Payer: BC Managed Care – PPO

## 2011-12-12 ENCOUNTER — Encounter: Payer: Self-pay | Admitting: Internal Medicine

## 2011-12-12 ENCOUNTER — Ambulatory Visit (INDEPENDENT_AMBULATORY_CARE_PROVIDER_SITE_OTHER): Payer: BC Managed Care – PPO | Admitting: Internal Medicine

## 2011-12-12 VITALS — BP 132/70 | HR 62 | Temp 98.0°F | Ht 65.0 in | Wt 149.0 lb

## 2011-12-12 DIAGNOSIS — Z23 Encounter for immunization: Secondary | ICD-10-CM

## 2011-12-12 DIAGNOSIS — I5022 Chronic systolic (congestive) heart failure: Secondary | ICD-10-CM

## 2011-12-12 DIAGNOSIS — Z0000 Encounter for general adult medical examination without abnormal findings: Secondary | ICD-10-CM

## 2011-12-12 LAB — CBC WITH DIFFERENTIAL/PLATELET
Basophils Absolute: 0 10*3/uL (ref 0.0–0.1)
Eosinophils Relative: 3.9 % (ref 0.0–5.0)
HCT: 37.5 % (ref 36.0–46.0)
Hemoglobin: 12.2 g/dL (ref 12.0–15.0)
Lymphocytes Relative: 32.2 % (ref 12.0–46.0)
Lymphs Abs: 1.1 10*3/uL (ref 0.7–4.0)
Monocytes Relative: 7.5 % (ref 3.0–12.0)
Neutro Abs: 2 10*3/uL (ref 1.4–7.7)
WBC: 3.5 10*3/uL — ABNORMAL LOW (ref 4.5–10.5)

## 2011-12-12 LAB — BASIC METABOLIC PANEL
BUN: 17 mg/dL (ref 6–23)
CO2: 29 mEq/L (ref 19–32)
Calcium: 9.4 mg/dL (ref 8.4–10.5)
Creatinine, Ser: 1.2 mg/dL (ref 0.4–1.2)
GFR: 58.89 mL/min — ABNORMAL LOW (ref >60.00)
Glucose, Bld: 81 mg/dL (ref 70–99)

## 2011-12-12 LAB — URINALYSIS, ROUTINE W REFLEX MICROSCOPIC
Hgb urine dipstick: NEGATIVE
Leukocytes, UA: NEGATIVE
Nitrite: NEGATIVE
Total Protein, Urine: NEGATIVE
Urobilinogen, UA: 0.2 (ref 0.0–1.0)

## 2011-12-12 LAB — LIPID PANEL
Cholesterol: 185 mg/dL (ref 0–200)
HDL: 42.9 mg/dL (ref >39.00)
Triglycerides: 123 mg/dL (ref 0.0–149.0)
VLDL: 24.6 mg/dL (ref 0.0–40.0)

## 2011-12-12 LAB — TSH: TSH: 21.66 u[IU]/mL — ABNORMAL HIGH (ref 0.35–5.50)

## 2011-12-12 LAB — HEPATIC FUNCTION PANEL
Albumin: 3.9 g/dL (ref 3.5–5.2)
Total Protein: 7.6 g/dL (ref 6.0–8.3)

## 2011-12-12 MED ORDER — BUDESONIDE-FORMOTEROL FUMARATE 160-4.5 MCG/ACT IN AERO: 2.0000 | INHALATION_SPRAY | Freq: Two times a day (BID) | RESPIRATORY_TRACT | Status: DC

## 2011-12-12 MED ORDER — TIOTROPIUM BROMIDE MONOHYDRATE 18 MCG IN CAPS: 18.0000 ug | ORAL_CAPSULE | RESPIRATORY_TRACT | Status: DC

## 2011-12-12 MED ORDER — LISINOPRIL 10 MG PO TABS: 10.0000 mg | ORAL_TABLET | Freq: Every day | ORAL | Status: DC

## 2011-12-12 MED ORDER — SPIRONOLACTONE 50 MG PO TABS: 50.0000 mg | ORAL_TABLET | Freq: Every day | ORAL | Status: DC

## 2011-12-12 NOTE — Patient Instructions (Addendum)
You had the flu shot today Continue all other medications as before (no changes today) Your refills were done today as requested Please remember to followup with your GYN for the yearly pap smear and/or mammogram as you do You will be contacted regarding the referral for: Gastroenterology (for consideration of colonoscopy for cancer screening) It is OK at this time to have your last upper tooth pulled, then get dentures Please keep your appointments with your specialists as you have planned - orthopedic as needed for your chronic right shoulder impingement Please go to LAB in the Basement for the blood and/or urine tests to be done today You will be contacted by phone if any changes need to be made immediately.  Otherwise, you will receive a letter about your results with an explanation. Please remember to sign up for My Chart at your earliest convenience, as this will be important to you in the future with finding out test results. Please continue your efforts at being more active, low cholesterol diet, and weight control. Please return in 6 months, or sooner if needed

## 2011-12-12 NOTE — Progress Notes (Signed)
Subjective:    Patient ID: [name], female    DOB: Sep 19, 1950, 61 y.o.   MRN: [id number]  HPI   Here for wellness and f/u;  Overall doing ok;  Pt denies CP, worsening SOB, DOE, wheezing, orthopnea, PND, worsening LE edema, palpitations, dizziness or syncope.  Pt denies neurological change such as new Headache, facial or extremity weakness.  Pt denies polydipsia, polyuria, or low sugar symptoms. Pt states overall good compliance with treatment and medications, good tolerability, and trying to follow lower cholesterol diet.  Pt denies worsening depressive symptoms, suicidal ideation or panic. No fever, wt loss, night sweats, loss of appetite, or other constitutional symptoms.  Pt states good ability with ADL's, low fall risk, home safety reviewed and adequate, no significant changes in hearing or vision, and occasionally active with exercise. C/o mild orthopnea like sob at night but has seen Dr crenshaw/card recently.  Needs med refills today.  Currently walking 15 blocks and back in .  Needs last upper frontal tooth pulled, then plans to get full upper and lower dentures.  Has ongoing pain to right shoulder with reduced ROM to abduction to 90 deg only. Due for flu shot and colon screening Past Medical History  Diagnosis Date  . Hypertension   . Genital warts     hx  . Anemia, iron deficiency   . Blood transfusion complicating pregnancy     after first child at 60 yo  . DJD (degenerative joint disease)     hands  . Cervical disc disease     with recurrent radicular pain, Dr. Montez Morita  . COPD (chronic obstructive pulmonary disease) 06/14/2010  . ALLERGIC RHINITIS 03/17/2008  . GOITER, MULTINODULAR 12/23/2009  . Cardiomyopathy     a. remote h/o cath with reported nonobs CAD; b. echo 5/12: EF 15%, mild LVH, mild MR, LAE, mod pericardial effusion with increased filling pressures  . Systolic CHF   . Hypothyroid   . CAD (coronary artery disease)     Nonobstructive by cardiac catheter 8/12:EF  10-15%, circumflex calcified without obstructive CAD, proximal LAD 20%, mid LAD 20%, proximal RCA 20%, mid RCA 20%.   Past Surgical History  Procedure Date  . Cholecystectomy   . Cardiac catheterization 1995    with "timy blockage" [facility] Dr. Daisy Floro    reports that she has quit smoking. She does not have any smokeless tobacco history on file. She reports that she does not drink alcohol or use illicit drugs. family history includes Alcohol abuse in her other; Cancer in her sister; Depression in her sister; Diabetes in her father, mother, and sister; Heart disease in her father, mother, and sister; Hypertension in her father, mother, and sister; Malignant hyperthermia in her other; Stroke in her father and mother; Thyroid disease in her sister; and Transient ischemic attack in her maternal grandfather. Allergies  Allergen Reactions  . Ivp Dye (Iodinated Diagnostic Agents) Other (See Comments)    Reaction: rash, itching and peel  . Penicillins Other (See Comments)    REACTION: rash, swelling  . Latex Rash    Allergic to latex gloves  . Strawberry Other (See Comments)    Reaction: rash, swelling   Current Outpatient Prescriptions on File Prior to Visit  Medication Sig Dispense Refill  . albuterol (PROVENTIL HFA;VENTOLIN HFA) 108 (90 BASE) MCG/ACT inhaler Inhale 2 puffs into the lungs every 6 (six) hours as needed for wheezing.  1 Inhaler  2  . aspirin 81 MG EC tablet Take 81 mg by  mouth daily.       . budesonide-formoterol (SYMBICORT) 160-4.5 MCG/ACT inhaler Inhale 2 puffs into the lungs 2 (two) times daily.  1 Inhaler  11  . carvedilol (COREG) 6.25 MG tablet Take 3.125 mg by mouth 2 (two) times daily with a meal. 1/2 tab po bid      . Cholecalciferol (VITAMIN D3) 1000 UNITS CAPS Take 1,000 Units by mouth daily.       . fexofenadine (ALLEGRA) 180 MG tablet Take 180 mg by mouth daily.        . fluticasone (VERAMYST) 27.5 MCG/SPRAY nasal spray Place 2 sprays into the nose daily.         . furosemide (LASIX) 40 MG tablet Take 40 mg by mouth daily.      Marland Kitchen spironolactone (ALDACTONE) 50 MG tablet Take 1 tablet (50 mg total) by mouth daily.  90 tablet  3  . tiotropium (SPIRIVA) 18 MCG inhalation capsule Place 1 capsule (18 mcg total) into inhaler and inhale every morning.  90 capsule  3   Review of Systems  Constitutional: Negative for diaphoresis and unexpected weight change.  HENT: Negative for tinnitus.   Eyes: Negative for photophobia and visual disturbance.  Respiratory: Negative for choking and stridor.   Gastrointestinal: Negative for vomiting and blood in stool.  Genitourinary: Negative for hematuria and decreased urine volume.  Musculoskeletal: Negative for gait problem.  Skin: Negative for color change and wound.  Neurological: Negative for tremors and numbness.  Psychiatric/Behavioral: Negative for decreased concentration. The patient is not hyperactive.       Objective:   Physical Exam BP 132/70  Pulse 62  Temp 98 F (36.7 C) (Oral)  Ht 5\' 5"  (1.651 m)  Wt 149 lb (67.586 kg)  BMI 24.79 kg/m2  SpO2 98% Physical Exam  VS noted Constitutional: Pt appears well-developed and well-nourished.  HENT: Head: Normocephalic.  Right Ear: External ear normal.  Left Ear: External ear normal.  Eyes: Conjunctivae and EOM are normal. Pupils are equal, round, and reactive to light.  Neck: Normal range of motion. Neck supple.  Cardiovascular: Normal rate and regular rhythm.   Pulmonary/Chest: Effort normal and breath sounds decreased, no rales or wheezing.  Abd:  Soft, NT, non-distended, + BS Neurological: Pt is alert. Not confused  Skin: Skin is warm. No erythema.  Psychiatric: Pt behavior is normal. Thought content normal.  Right shoudler NT, but pain elicited with passive ROM to 90 deg only    Assessment & Plan:

## 2011-12-13 ENCOUNTER — Encounter: Payer: Self-pay | Admitting: Internal Medicine

## 2011-12-14 ENCOUNTER — Other Ambulatory Visit: Payer: Self-pay | Admitting: Internal Medicine

## 2011-12-14 ENCOUNTER — Encounter: Payer: Self-pay | Admitting: Internal Medicine

## 2011-12-14 MED ORDER — LEVOTHYROXINE SODIUM 175 MCG PO TABS: 175.0000 ug | ORAL_TABLET | Freq: Every day | ORAL | Status: DC

## 2011-12-15 ENCOUNTER — Encounter: Payer: Self-pay | Admitting: Internal Medicine

## 2011-12-15 NOTE — Assessment & Plan Note (Signed)
stable overall by hx and exam, , and pt to continue medical treatment as before, to f/u with card as planned

## 2011-12-15 NOTE — Assessment & Plan Note (Signed)

## 2011-12-25 ENCOUNTER — Telehealth: Payer: Self-pay

## 2011-12-25 NOTE — Telephone Encounter (Signed)
The patient called back from receiving multiples messages on her lab results from 12/12/11.  I did inform of her results per MD instructions and informed of thyroid medication increase.

## 2012-01-02 ENCOUNTER — Ambulatory Visit (AMBULATORY_SURGERY_CENTER): Payer: BC Managed Care – PPO | Admitting: *Deleted

## 2012-01-02 DIAGNOSIS — Z1211 Encounter for screening for malignant neoplasm of colon: Secondary | ICD-10-CM

## 2012-01-02 NOTE — Progress Notes (Signed)
Patient is here for Direct Screening Colonoscopy. She has had extensive cardiac history including MI,chronic heart failure, EF 15%, CAD, cardiomyopathy ,and had implantable cardioverter defibrillator placed by Dr.Taylor on 01-22-2011. She also has COPD. After reviewing her last office notes and speaking with Therese Sarah we felt like patient needs office visit with Dr.Pyrtle before proceeding with colonoscopy. Patient was very understanding and agrees to this also. New office visit made on the 1st available appointment date, on Nov. 15,2013 at 1:30 pm.  This note was also sent to Dr.Pyrtle. Thanks!

## 2012-01-03 ENCOUNTER — Encounter: Payer: Self-pay | Admitting: Internal Medicine

## 2012-01-16 ENCOUNTER — Encounter: Payer: BC Managed Care – PPO | Admitting: Internal Medicine

## 2012-01-24 ENCOUNTER — Encounter: Payer: Self-pay | Admitting: Internal Medicine

## 2012-01-25 ENCOUNTER — Ambulatory Visit: Payer: BC Managed Care – PPO | Admitting: Internal Medicine

## 2012-01-25 ENCOUNTER — Telehealth: Payer: Self-pay | Admitting: Internal Medicine

## 2012-02-08 ENCOUNTER — Encounter: Payer: Self-pay | Admitting: *Deleted

## 2012-02-11 ENCOUNTER — Encounter: Payer: Self-pay | Admitting: Internal Medicine

## 2012-02-11 ENCOUNTER — Ambulatory Visit (INDEPENDENT_AMBULATORY_CARE_PROVIDER_SITE_OTHER): Payer: BC Managed Care – PPO | Admitting: *Deleted

## 2012-02-11 DIAGNOSIS — I428 Other cardiomyopathies: Secondary | ICD-10-CM

## 2012-02-11 DIAGNOSIS — I429 Cardiomyopathy, unspecified: Secondary | ICD-10-CM

## 2012-02-11 DIAGNOSIS — I5022 Chronic systolic (congestive) heart failure: Secondary | ICD-10-CM

## 2012-02-11 LAB — ICD DEVICE OBSERVATION
AL THRESHOLD: 0.5 V
BAMS-0001: 170 {beats}/min
BATTERY VOLTAGE: 3.1278 V
FVT: 0
PACEART VT: 0
RV LEAD AMPLITUDE: 9.4 mv
RV LEAD THRESHOLD: 0.5 V
TZAT-0001ATACH: 1
TZAT-0001ATACH: 2
TZAT-0001FASTVT: 1
TZAT-0001SLOWVT: 1
TZAT-0002ATACH: NEGATIVE
TZAT-0002ATACH: NEGATIVE
TZAT-0002ATACH: NEGATIVE
TZAT-0002FASTVT: NEGATIVE
TZAT-0012ATACH: 150 ms
TZAT-0012FASTVT: 170 ms
TZAT-0012SLOWVT: 170 ms
TZAT-0018ATACH: NEGATIVE
TZAT-0018FASTVT: NEGATIVE
TZAT-0018SLOWVT: NEGATIVE
TZAT-0019ATACH: 6 V
TZAT-0019FASTVT: 8 V
TZON-0003ATACH: 350 ms
TZON-0004SLOWVT: 16
TZST-0001ATACH: 5
TZST-0001ATACH: 6
TZST-0001FASTVT: 2
TZST-0001FASTVT: 4
TZST-0001FASTVT: 6
TZST-0001SLOWVT: 4
TZST-0001SLOWVT: 5
TZST-0001SLOWVT: 6
TZST-0002ATACH: NEGATIVE
TZST-0002FASTVT: NEGATIVE
TZST-0002FASTVT: NEGATIVE
TZST-0002FASTVT: NEGATIVE
TZST-0002FASTVT: NEGATIVE
TZST-0002SLOWVT: NEGATIVE
TZST-0002SLOWVT: NEGATIVE
TZST-0002SLOWVT: NEGATIVE
VENTRICULAR PACING ICD: 95.82 pct
VF: 0

## 2012-02-11 NOTE — Patient Instructions (Signed)
Return office visit with Dr Ladona Ridgel in 3 months.

## 2012-02-11 NOTE — Progress Notes (Signed)
defib check in clinic  

## 2012-03-13 ENCOUNTER — Other Ambulatory Visit: Payer: Self-pay | Admitting: Cardiology

## 2012-03-14 NOTE — Telephone Encounter (Signed)
No

## 2012-03-27 ENCOUNTER — Encounter: Payer: Self-pay | Admitting: Internal Medicine

## 2012-03-28 ENCOUNTER — Ambulatory Visit: Payer: BC Managed Care – PPO | Admitting: Internal Medicine

## 2012-05-13 ENCOUNTER — Encounter: Payer: BC Managed Care – PPO | Admitting: Internal Medicine

## 2012-05-28 ENCOUNTER — Encounter: Payer: Self-pay | Admitting: Cardiology

## 2012-05-28 ENCOUNTER — Ambulatory Visit (INDEPENDENT_AMBULATORY_CARE_PROVIDER_SITE_OTHER): Payer: BC Managed Care – PPO | Admitting: Cardiology

## 2012-05-28 VITALS — BP 150/87 | HR 80 | Wt 161.0 lb

## 2012-05-28 DIAGNOSIS — I428 Other cardiomyopathies: Secondary | ICD-10-CM

## 2012-05-28 MED ORDER — CARVEDILOL 6.25 MG PO TABS: 6.2500 mg | ORAL_TABLET | Freq: Two times a day (BID) | ORAL | Status: DC

## 2012-05-28 MED ORDER — LISINOPRIL 20 MG PO TABS: 20.0000 mg | ORAL_TABLET | Freq: Every day | ORAL | Status: DC

## 2012-05-28 NOTE — Patient Instructions (Addendum)
Your physician recommends that you schedule a follow-up appointment in: 2-4 WEEKS WITH DR CRENSHAW OR EXTENDER FOR MEDICATION TITRATION   INCREASE CARVEDILOL TO 6.25 MG ONE TABLET TWICE DAILY  INCREASE LISINOPRIL 20 MG ONCE DAILY  Your physician recommends that you return for lab work in: ONE WEEK

## 2012-05-28 NOTE — Progress Notes (Signed)
[id number] female with a history of dilated cardiomyopathy for fu. She was in the hospital in March 2012 with severe hypothyroidism and congestive heart failure. She was admitted again in 5/12 with a/c systolic CHF. Her TSH had normalized with treatment of her hypothyroidism. Followup echocardiogram in June 2012 demonstrated continued LV dysfunction with an EF of 15%. Cardiac catheterization was performed 10/25/10: EF 10-15%, circumflex calcified without obstructive CAD, proximal LAD 20%, mid LAD 20%, proximal RCA 20%, mid RCA 20%. She was felt to have mild CAD and a nonischemic cardiomyopathy. She had ICD placed 11/12. Since I last saw her in Sept 2013, some dyspnea with more extreme activities. There is occasional orthopnea and PND. No pedal edema. No chest pain or syncope.   Current Outpatient Prescriptions  Medication Sig Dispense Refill  . albuterol (PROVENTIL HFA;VENTOLIN HFA) 108 (90 BASE) MCG/ACT inhaler Inhale 2 puffs into the lungs every 6 (six) hours as needed for wheezing.  1 Inhaler  2  . aspirin 81 MG EC tablet Take 81 mg by mouth daily.       . budesonide-formoterol (SYMBICORT) 160-4.5 MCG/ACT inhaler Inhale 2 puffs into the lungs 2 (two) times daily.  1 Inhaler  11  . carvedilol (COREG) 6.25 MG tablet 1/2 tab po bid      . Cholecalciferol (VITAMIN D3) 1000 UNITS CAPS Take 1,000 Units by mouth daily.       . fexofenadine (ALLEGRA) 180 MG tablet Take 180 mg by mouth daily.        . fluticasone (VERAMYST) 27.5 MCG/SPRAY nasal spray Place 2 sprays into the nose daily.       . furosemide (LASIX) 40 MG tablet Take 40 mg by mouth daily.      Marland Kitchen levothyroxine (SYNTHROID, LEVOTHROID) 175 MCG tablet Take 1 tablet (175 mcg total) by mouth daily.  90 tablet  3  . lisinopril (PRINIVIL,ZESTRIL) 10 MG tablet Take 1 tablet (10 mg total) by mouth daily.  90 tablet  3  . spironolactone (ALDACTONE) 50 MG tablet Take 1 tablet (50 mg total) by mouth daily.  90 tablet  3  . tiotropium (SPIRIVA) 18 MCG  inhalation capsule Place 1 capsule (18 mcg total) into inhaler and inhale every morning.  90 capsule  3   No current facility-administered medications for this visit.     Past Medical History  Diagnosis Date  . Hypertension   . Genital warts     hx  . Anemia, iron deficiency   . Blood transfusion complicating pregnancy     after first child at 59 yo  . DJD (degenerative joint disease)     hands  . Cervical disc disease     with recurrent radicular pain, Dr. Montez Morita  . COPD (chronic obstructive pulmonary disease) 06/14/2010  . ALLERGIC RHINITIS 03/17/2008  . GOITER, MULTINODULAR 12/23/2009  . Cardiomyopathy     a. remote h/o cath with reported nonobs CAD; b. echo 5/12: EF 15%, mild LVH, mild MR, LAE, mod pericardial effusion with increased filling pressures  . Systolic CHF   . Hypothyroid   . CAD (coronary artery disease)     Nonobstructive by cardiac catheter 8/12:EF 10-15%, circumflex calcified without obstructive CAD, proximal LAD 20%, mid LAD 20%, proximal RCA 20%, mid RCA 20%.  . Myocardial infarction     Past Surgical History  Procedure Laterality Date  . Cholecystectomy    . Cardiac catheterization  1995    with "timy blockage" [facility] Dr. Daisy Floro  . Cardiac defibrillator placement  01-22-11    by Dr.Taylor    History   Social History  . Marital Status: Married    Spouse Name: N/A    Number of Children: 3  . Years of Education: N/A   Occupational History  .     Social History Main Topics  . Smoking status: Former Smoker -- 0.25 packs/day  . Smokeless tobacco: Not on file     Comment: very light, occasional stopped she states now  . Alcohol Use: No  . Drug Use: No  . Sexually Active: No   Other Topics Concern  . Not on file   Social History Narrative  . No narrative on file    ROS: no fevers or chills, productive cough, hemoptysis, dysphasia, odynophagia, melena, hematochezia, dysuria, hematuria, rash, seizure activity, orthopnea, PND, pedal  edema, claudication. Remaining systems are negative.  Physical Exam: Well-developed well-nourished in no acute distress.  Skin is warm and dry.  HEENT is normal.  Neck is supple.  Chest is clear to auscultation with normal expansion. ICD left chest. Cardiovascular exam is regular rate and rhythm.  Abdominal exam nontender or distended. No masses palpated. Extremities show no edema. neuro grossly intact  ECG sinus rhythm with ventricular pacing.

## 2012-05-28 NOTE — Assessment & Plan Note (Signed)
Patient's blood pressure is elevated. Increase carvedilol to 6.25 twice a day. Increase lisinopril to 20 mg daily. Check potassium and renal function in one week.

## 2012-05-28 NOTE — Assessment & Plan Note (Signed)
Continue aspirin for history of nonobstructive coronary disease.

## 2012-05-28 NOTE — Assessment & Plan Note (Signed)
Management per electrophysiology. 

## 2012-05-28 NOTE — Assessment & Plan Note (Signed)
Patient's blood pressure is elevated and we will increase her carvedilol and lisinopril. She will return in 2-4 weeks and we will continue to titrate medications as needed. Once her medications are further titrated and her blood pressure improves plan repeat echocardiogram.

## 2012-05-28 NOTE — Assessment & Plan Note (Signed)
Continue present dose of diuretics. Check potassium and renal function in one week.

## 2012-06-04 ENCOUNTER — Other Ambulatory Visit (INDEPENDENT_AMBULATORY_CARE_PROVIDER_SITE_OTHER): Payer: BC Managed Care – PPO

## 2012-06-04 DIAGNOSIS — I428 Other cardiomyopathies: Secondary | ICD-10-CM

## 2012-06-04 LAB — BASIC METABOLIC PANEL
CO2: 29 mEq/L (ref 19–32)
Chloride: 104 mEq/L (ref 96–112)
Creatinine, Ser: 1 mg/dL (ref 0.4–1.2)
Potassium: 3.9 mEq/L (ref 3.5–5.1)

## 2012-06-10 ENCOUNTER — Ambulatory Visit (INDEPENDENT_AMBULATORY_CARE_PROVIDER_SITE_OTHER): Payer: BC Managed Care – PPO | Admitting: Internal Medicine

## 2012-06-10 ENCOUNTER — Encounter: Payer: Self-pay | Admitting: Internal Medicine

## 2012-06-10 VITALS — BP 113/67 | HR 71 | Ht 65.0 in | Wt 159.6 lb

## 2012-06-10 DIAGNOSIS — Z95810 Presence of automatic (implantable) cardiac defibrillator: Secondary | ICD-10-CM

## 2012-06-10 DIAGNOSIS — I429 Cardiomyopathy, unspecified: Secondary | ICD-10-CM

## 2012-06-10 DIAGNOSIS — I428 Other cardiomyopathies: Secondary | ICD-10-CM

## 2012-06-10 DIAGNOSIS — I5022 Chronic systolic (congestive) heart failure: Secondary | ICD-10-CM

## 2012-06-10 LAB — ICD DEVICE OBSERVATION
AL IMPEDENCE ICD: 437 Ohm
AL THRESHOLD: 0.5 V
BAMS-0001: 170 {beats}/min
BATTERY VOLTAGE: 3.1074 V
FVT: 0
LV LEAD THRESHOLD: 2 V
PACEART VT: 0
RV LEAD AMPLITUDE: 10.25 mv
RV LEAD THRESHOLD: 0.875 V
TOT-0002: 0
TZAT-0001ATACH: 3
TZAT-0002ATACH: NEGATIVE
TZAT-0012ATACH: 150 ms
TZAT-0012ATACH: 150 ms
TZAT-0012FASTVT: 170 ms
TZAT-0012SLOWVT: 170 ms
TZAT-0018ATACH: NEGATIVE
TZAT-0018FASTVT: NEGATIVE
TZAT-0018SLOWVT: NEGATIVE
TZAT-0019ATACH: 6 V
TZAT-0019FASTVT: 8 V
TZAT-0020ATACH: 1.5 ms
TZAT-0020ATACH: 1.5 ms
TZAT-0020FASTVT: 1.5 ms
TZAT-0020SLOWVT: 1.5 ms
TZON-0003ATACH: 350 ms
TZON-0003SLOWVT: 330 ms
TZON-0003VSLOWVT: 360 ms
TZON-0004SLOWVT: 16
TZST-0001ATACH: 4
TZST-0001ATACH: 5
TZST-0001ATACH: 6
TZST-0001FASTVT: 2
TZST-0001FASTVT: 6
TZST-0001SLOWVT: 2
TZST-0001SLOWVT: 4
TZST-0001SLOWVT: 5
TZST-0001SLOWVT: 6
TZST-0002ATACH: NEGATIVE
TZST-0002ATACH: NEGATIVE
TZST-0002FASTVT: NEGATIVE
TZST-0002FASTVT: NEGATIVE
TZST-0002FASTVT: NEGATIVE
TZST-0002SLOWVT: NEGATIVE
TZST-0002SLOWVT: NEGATIVE
VENTRICULAR PACING ICD: 96.81 pct
VF: 0

## 2012-06-10 NOTE — Progress Notes (Signed)
HPI Mrs.  Pena returns today for followup. She is a very pleasant 62 year old woman with a nonischemic cardiomyopathy left bundle branch block, status post permanent pacemaker insertion. In the interim, she has been stable. Her heart failure symptoms are well-controlled. She denies chest pain or shortness of breath. No syncope.  Allergies  Allergen Reactions  . Ivp Dye (Iodinated Diagnostic Agents) Other (See Comments)    Reaction: rash, itching and peel  . Penicillins Other (See Comments)    REACTION: rash, swelling  . Latex Rash    Allergic to latex gloves  . Strawberry Other (See Comments)    Reaction: rash, swelling     Current Outpatient Prescriptions  Medication Sig Dispense Refill  . albuterol (PROVENTIL HFA;VENTOLIN HFA) 108 (90 BASE) MCG/ACT inhaler Inhale 2 puffs into the lungs every 6 (six) hours as needed for wheezing.  1 Inhaler  2  . aspirin 81 MG EC tablet Take 81 mg by mouth daily.       . budesonide-formoterol (SYMBICORT) 160-4.5 MCG/ACT inhaler Inhale 2 puffs into the lungs 2 (two) times daily.  1 Inhaler  11  . carvedilol (COREG) 6.25 MG tablet Take 1 tablet (6.25 mg total) by mouth 2 (two) times daily with a meal.  180 tablet  4  . Cholecalciferol (VITAMIN D3) 1000 UNITS CAPS Take 1,000 Units by mouth daily.       . fexofenadine (ALLEGRA) 180 MG tablet Take 180 mg by mouth daily.        . fluticasone (VERAMYST) 27.5 MCG/SPRAY nasal spray Place 2 sprays into the nose daily.       . furosemide (LASIX) 40 MG tablet Take 40 mg by mouth daily.      Marland Kitchen levothyroxine (SYNTHROID, LEVOTHROID) 175 MCG tablet Take 1 tablet (175 mcg total) by mouth daily.  90 tablet  3  . lisinopril (PRINIVIL,ZESTRIL) 20 MG tablet Take 1 tablet (20 mg total) by mouth daily.  90 tablet  4  . spironolactone (ALDACTONE) 50 MG tablet Take 1 tablet (50 mg total) by mouth daily.  90 tablet  3  . tiotropium (SPIRIVA) 18 MCG inhalation capsule Place 1 capsule (18 mcg total) into inhaler and inhale every  morning.  90 capsule  3   No current facility-administered medications for this visit.     Past Medical History  Diagnosis Date  . Hypertension   . Genital warts     hx  . Anemia, iron deficiency   . Blood transfusion complicating pregnancy     after first child at 71 yo  . DJD (degenerative joint disease)     hands  . Cervical disc disease     with recurrent radicular pain, Dr. Montez Morita  . COPD (chronic obstructive pulmonary disease) 06/14/2010  . ALLERGIC RHINITIS 03/17/2008  . GOITER, MULTINODULAR 12/23/2009  . Cardiomyopathy     a. remote h/o cath with reported nonobs CAD; b. echo 5/12: EF 15%, mild LVH, mild MR, LAE, mod pericardial effusion with increased filling pressures  . Systolic CHF   . Hypothyroid   . CAD (coronary artery disease)     Nonobstructive by cardiac catheter 8/12:EF 10-15%, circumflex calcified without obstructive CAD, proximal LAD 20%, mid LAD 20%, proximal RCA 20%, mid RCA 20%.  . Myocardial infarction     ROS:   All systems reviewed and negative except as noted in the HPI.   Past Surgical History  Procedure Laterality Date  . Cholecystectomy    . Cardiac catheterization  1995  with "timy blockage" [facility] Dr. Daisy Floro  . Cardiac defibrillator placement  01-22-11    by Dr.Taylor     Family History  Problem Relation Age of Onset  . Stroke Mother   . Heart disease Mother   . Hypertension Mother   . Diabetes Mother   . Stroke Father   . Heart disease Father   . Hypertension Father   . Diabetes Father   . Cancer Sister     colon  . Heart disease Sister     pacemaker  . Hypertension Sister   . Depression Sister   . Diabetes Sister   . Thyroid disease Sister     uncertain type  . Transient ischemic attack Maternal Grandfather   . Alcohol abuse Other   . Malignant hyperthermia Other      History   Social History  . Marital Status: Married    Spouse Name: N/A    Number of Children: 3  . Years of Education: N/A    Occupational History  .     Social History Main Topics  . Smoking status: Former Smoker -- 0.25 packs/day  . Smokeless tobacco: Not on file     Comment: very light, occasional stopped she states now  . Alcohol Use: No  . Drug Use: No  . Sexually Active: No   Other Topics Concern  . Not on file   Social History Narrative  . No narrative on file     BP 113/67  Pulse 71  Ht 5\' 5"  (1.651 m)  Wt 159 lb 9.6 oz (72.394 kg)  BMI 26.56 kg/m2  Physical Exam:  Well appearing 62 year old woman, NAD HEENT: Unremarkable Neck:  7 cm JVD, no thyromegally Lymphatics:  No adenopathy Back:  No CVA tenderness Lungs:  Clear with no wheezes, rales, or rhonchi. HEART:  Regular rate rhythm, no murmurs, no rubs, no clicks Abd:  soft, positive bowel sounds, no organomegally, no rebound, no guarding Ext:  2 plus pulses, no edema, no cyanosis, no clubbing Skin:  No rashes no nodules Neuro:  CN II through XII intact, motor grossly intact  DEVICE  Normal device function.  See PaceArt for details.   Assess/Plan:

## 2012-06-10 NOTE — Assessment & Plan Note (Signed)
Her chronic systolic heart failure is well compensated. She will continue her current medical therapy.

## 2012-06-10 NOTE — Assessment & Plan Note (Signed)
Normal device function. Her left ventricular threshold is chronically elevated but stable.

## 2012-06-10 NOTE — Patient Instructions (Addendum)
Your physician recommends that you schedule a follow-up appointment in: 3 months device clinic

## 2012-06-20 ENCOUNTER — Encounter: Payer: Self-pay | Admitting: Cardiology

## 2012-06-20 ENCOUNTER — Ambulatory Visit (INDEPENDENT_AMBULATORY_CARE_PROVIDER_SITE_OTHER): Payer: BC Managed Care – PPO | Admitting: Cardiology

## 2012-06-20 VITALS — BP 143/81 | HR 64 | Ht 65.0 in | Wt 157.0 lb

## 2012-06-20 DIAGNOSIS — I429 Cardiomyopathy, unspecified: Secondary | ICD-10-CM

## 2012-06-20 DIAGNOSIS — I10 Essential (primary) hypertension: Secondary | ICD-10-CM

## 2012-06-20 DIAGNOSIS — I428 Other cardiomyopathies: Secondary | ICD-10-CM

## 2012-06-20 DIAGNOSIS — I5022 Chronic systolic (congestive) heart failure: Secondary | ICD-10-CM

## 2012-06-20 DIAGNOSIS — Z95810 Presence of automatic (implantable) cardiac defibrillator: Secondary | ICD-10-CM

## 2012-06-20 MED ORDER — CARVEDILOL 12.5 MG PO TABS: 12.5000 mg | ORAL_TABLET | Freq: Two times a day (BID) | ORAL | Status: DC

## 2012-06-20 NOTE — Assessment & Plan Note (Signed)
Continue present dose of diuretics. 

## 2012-06-20 NOTE — Assessment & Plan Note (Signed)
Blood pressure mildly elevated. Increase carvedilol.

## 2012-06-20 NOTE — Assessment & Plan Note (Signed)
Minor nonobstructive coronary disease. Continue aspirin.

## 2012-06-20 NOTE — Patient Instructions (Addendum)
Your physician recommends that you schedule a follow-up appointment in: 3 MONTHS WITH DR CRENSHAW  INCREASE CARVEDILOL TO 12.5 MG TWICE DAILY   

## 2012-06-20 NOTE — Assessment & Plan Note (Signed)
Management per electrophysiology. 

## 2012-06-20 NOTE — Progress Notes (Signed)
HPI: Pleasant female with a history of dilated cardiomyopathy for fu. She was in the hospital in March 2012 with severe hypothyroidism and congestive heart failure. She was admitted again in 5/12 with a/c systolic CHF. Her TSH had normalized with treatment of her hypothyroidism. Followup echocardiogram in June 2012 demonstrated continued LV dysfunction with an EF of 15%. Cardiac catheterization was performed 10/25/10: EF 10-15%, circumflex calcified without obstructive CAD, proximal LAD 20%, mid LAD 20%, proximal RCA 20%, mid RCA 20%. She was felt to have mild CAD and a nonischemic cardiomyopathy. She had ICD placed 11/12. I last saw her in March of 2014 and we increased her medications for her blood pressure and cardiomyopathy. Since that time, the patient has dyspnea with more extreme activities but not with routine activities. It is relieved with rest. It is not associated with chest pain. There is no orthopnea, PND or pedal edema. There is no syncope or palpitations. There is no exertional chest pain.    Current Outpatient Prescriptions  Medication Sig Dispense Refill  . albuterol (PROVENTIL HFA;VENTOLIN HFA) 108 (90 BASE) MCG/ACT inhaler Inhale 2 puffs into the lungs every 6 (six) hours as needed for wheezing.  1 Inhaler  2  . aspirin 81 MG EC tablet Take 81 mg by mouth daily.       . budesonide-formoterol (SYMBICORT) 160-4.5 MCG/ACT inhaler Inhale 2 puffs into the lungs 2 (two) times daily.  1 Inhaler  11  . carvedilol (COREG) 6.25 MG tablet Take 1 tablet (6.25 mg total) by mouth 2 (two) times daily with a meal.  180 tablet  4  . Cholecalciferol (VITAMIN D3) 1000 UNITS CAPS Take 1,000 Units by mouth daily.       . fexofenadine (ALLEGRA) 180 MG tablet Take 180 mg by mouth daily.        . fluticasone (VERAMYST) 27.5 MCG/SPRAY nasal spray Place 2 sprays into the nose daily.       . furosemide (LASIX) 40 MG tablet Take 40 mg by mouth daily.      Marland Kitchen levothyroxine (SYNTHROID, LEVOTHROID) 175 MCG  tablet Take 1 tablet (175 mcg total) by mouth daily.  90 tablet  3  . lisinopril (PRINIVIL,ZESTRIL) 20 MG tablet Take 1 tablet (20 mg total) by mouth daily.  90 tablet  4  . spironolactone (ALDACTONE) 50 MG tablet Take 1 tablet (50 mg total) by mouth daily.  90 tablet  3  . tiotropium (SPIRIVA) 18 MCG inhalation capsule Place 1 capsule (18 mcg total) into inhaler and inhale every morning.  90 capsule  3   No current facility-administered medications for this visit.     Past Medical History  Diagnosis Date  . Hypertension   . Genital warts     hx  . Anemia, iron deficiency   . Blood transfusion complicating pregnancy     after first child at 22 yo  . DJD (degenerative joint disease)     hands  . Cervical disc disease     with recurrent radicular pain, Dr. Montez Morita  . COPD (chronic obstructive pulmonary disease) 06/14/2010  . ALLERGIC RHINITIS 03/17/2008  . GOITER, MULTINODULAR 12/23/2009  . Cardiomyopathy     a. remote h/o cath with reported nonobs CAD; b. echo 5/12: EF 15%, mild LVH, mild MR, LAE, mod pericardial effusion with increased filling pressures  . Systolic CHF   . Hypothyroid   . CAD (coronary artery disease)     Nonobstructive by cardiac catheter 8/12:EF 10-15%, circumflex calcified without obstructive CAD,  proximal LAD 20%, mid LAD 20%, proximal RCA 20%, mid RCA 20%.  . Myocardial infarction     Past Surgical History  Procedure Laterality Date  . Cholecystectomy    . Cardiac catheterization  1995    with "timy blockage" [facility] Dr. Daisy Floro  . Cardiac defibrillator placement  01-22-11    by Dr.Taylor    History   Social History  . Marital Status: Married    Spouse Name: N/A    Number of Children: 3  . Years of Education: N/A   Occupational History  .     Social History Main Topics  . Smoking status: Former Smoker -- 0.25 packs/day  . Smokeless tobacco: Not on file     Comment: very light, occasional stopped she states now  . Alcohol Use: No  . Drug  Use: No  . Sexually Active: No   Other Topics Concern  . Not on file   Social History Narrative  . No narrative on file    ROS: no fevers or chills, productive cough, hemoptysis, dysphasia, odynophagia, melena, hematochezia, dysuria, hematuria, rash, seizure activity, orthopnea, PND, pedal edema, claudication. Remaining systems are negative.  Physical Exam: Well-developed well-nourished in no acute distress.  Skin is warm and dry.  HEENT is normal.  Neck is supple.  Chest is clear to auscultation with normal expansion.  Cardiovascular exam is regular rate and rhythm.  Abdominal exam nontender or distended. No masses palpated. Extremities show no edema. neuro grossly intact

## 2012-06-20 NOTE — Assessment & Plan Note (Signed)
Patient's blood pressure is elevated and we will increase her carvedilol to 12.5 mg by mouth twice a day. She will call in 2-4 weeks and report her blood pressure. If her systolic is greater than 120 we will increase her lisinopril to 40 mg daily. Once her medications are further titrated and her blood pressure improves plan repeat echocardiogram.

## 2012-06-30 ENCOUNTER — Encounter: Payer: Self-pay | Admitting: Internal Medicine

## 2012-09-10 ENCOUNTER — Encounter: Payer: Self-pay | Admitting: Internal Medicine

## 2012-09-10 ENCOUNTER — Ambulatory Visit (INDEPENDENT_AMBULATORY_CARE_PROVIDER_SITE_OTHER): Payer: BC Managed Care – PPO | Admitting: *Deleted

## 2012-09-10 DIAGNOSIS — Z95810 Presence of automatic (implantable) cardiac defibrillator: Secondary | ICD-10-CM

## 2012-09-10 DIAGNOSIS — I429 Cardiomyopathy, unspecified: Secondary | ICD-10-CM

## 2012-09-10 DIAGNOSIS — I428 Other cardiomyopathies: Secondary | ICD-10-CM

## 2012-09-10 LAB — ICD DEVICE OBSERVATION
AL IMPEDENCE ICD: 437 Ohm
AL THRESHOLD: 0.5 V
ATRIAL PACING ICD: 7.67 pct
BAMS-0001: 170 {beats}/min
BATTERY VOLTAGE: 3.0938 V
CHARGE TIME: 9.779 s
LV LEAD IMPEDENCE ICD: 418 Ohm
PACEART VT: 0
TOT-0002: 0
TZAT-0001ATACH: 3
TZAT-0001FASTVT: 1
TZAT-0012ATACH: 150 ms
TZAT-0012ATACH: 150 ms
TZAT-0012ATACH: 150 ms
TZAT-0012SLOWVT: 170 ms
TZAT-0018FASTVT: NEGATIVE
TZAT-0018SLOWVT: NEGATIVE
TZAT-0019ATACH: 6 V
TZAT-0019FASTVT: 8 V
TZAT-0020ATACH: 1.5 ms
TZAT-0020ATACH: 1.5 ms
TZAT-0020SLOWVT: 1.5 ms
TZON-0003ATACH: 350 ms
TZON-0003SLOWVT: 330 ms
TZON-0003VSLOWVT: 360 ms
TZON-0004SLOWVT: 16
TZST-0001ATACH: 4
TZST-0001ATACH: 6
TZST-0001FASTVT: 2
TZST-0001FASTVT: 3
TZST-0001FASTVT: 4
TZST-0001FASTVT: 6
TZST-0001SLOWVT: 2
TZST-0001SLOWVT: 6
TZST-0002ATACH: NEGATIVE
TZST-0002FASTVT: NEGATIVE
TZST-0002FASTVT: NEGATIVE
TZST-0002SLOWVT: NEGATIVE
TZST-0002SLOWVT: NEGATIVE
VENTRICULAR PACING ICD: 98.78 pct

## 2012-09-10 NOTE — Progress Notes (Signed)
CRT-D + OptiVol check in clinic, all functions normal, see PaceArt for full details.  ROV w/ device clinic in 3 mo.  CZ

## 2012-09-15 ENCOUNTER — Encounter: Payer: Self-pay | Admitting: Cardiology

## 2012-09-15 ENCOUNTER — Ambulatory Visit (INDEPENDENT_AMBULATORY_CARE_PROVIDER_SITE_OTHER): Payer: BC Managed Care – PPO | Admitting: Cardiology

## 2012-09-15 VITALS — BP 150/89 | HR 68 | Ht 65.0 in | Wt 164.8 lb

## 2012-09-15 DIAGNOSIS — I10 Essential (primary) hypertension: Secondary | ICD-10-CM

## 2012-09-15 DIAGNOSIS — I428 Other cardiomyopathies: Secondary | ICD-10-CM

## 2012-09-15 DIAGNOSIS — I5022 Chronic systolic (congestive) heart failure: Secondary | ICD-10-CM

## 2012-09-15 DIAGNOSIS — Z95810 Presence of automatic (implantable) cardiac defibrillator: Secondary | ICD-10-CM

## 2012-09-15 MED ORDER — LEVOTHYROXINE SODIUM 175 MCG PO TABS: 175.0000 ug | ORAL_TABLET | Freq: Every day | ORAL | Status: DC

## 2012-09-15 MED ORDER — SPIRONOLACTONE 50 MG PO TABS: 50.0000 mg | ORAL_TABLET | Freq: Every day | ORAL | Status: DC

## 2012-09-15 MED ORDER — LISINOPRIL 40 MG PO TABS: 40.0000 mg | ORAL_TABLET | Freq: Every day | ORAL | Status: DC

## 2012-09-15 NOTE — Assessment & Plan Note (Signed)
Management per electrophysiology. 

## 2012-09-15 NOTE — Assessment & Plan Note (Signed)
euvolemic on examination. Continue present dose of diuretics. Check potassium and renal function. 

## 2012-09-15 NOTE — Assessment & Plan Note (Signed)
Patient with nonischemic cardiomyopathy. Blood pressure remains increased. Increase lisinopril to 40 mg daily. Check potassium and renal function in one week. Continue beta blocker and diuretics. Plan repeat echocardiogram once medications fully titrated.

## 2012-09-15 NOTE — Assessment & Plan Note (Signed)
Blood pressure elevated. Increase lisinopril.

## 2012-09-15 NOTE — Progress Notes (Signed)
HPI: Pleasant female with a history of dilated cardiomyopathy for fu. She was in the hospital in March 2012 with severe hypothyroidism and congestive heart failure. She was admitted again in 5/12 with a/c systolic CHF. Her TSH had normalized with treatment of her hypothyroidism. Followup echocardiogram in June 2012 demonstrated continued LV dysfunction with an EF of 15%. Cardiac catheterization was performed 10/25/10: EF 10-15%, circumflex calcified without obstructive CAD, proximal LAD 20%, mid LAD 20%, proximal RCA 20%, mid RCA 20%. She was felt to have mild CAD and a nonischemic cardiomyopathy. She had ICD placed 11/12. I last saw her in April of 2014. Since then, she has some dyspnea on exertion but improved. No orthopnea, PND, pedal edema, syncope or chest pain.   Current Outpatient Prescriptions  Medication Sig Dispense Refill  . aspirin 81 MG EC tablet Take 81 mg by mouth daily.       . budesonide-formoterol (SYMBICORT) 160-4.5 MCG/ACT inhaler Inhale 2 puffs into the lungs 2 (two) times daily.  1 Inhaler  11  . carvedilol (COREG) 12.5 MG tablet Take 1 tablet (12.5 mg total) by mouth 2 (two) times daily with a meal.  180 tablet  4  . Cholecalciferol (VITAMIN D3) 1000 UNITS CAPS Take 1,000 Units by mouth daily.       . fexofenadine (ALLEGRA) 180 MG tablet Take 180 mg by mouth daily.        . fluticasone (VERAMYST) 27.5 MCG/SPRAY nasal spray Place 2 sprays into the nose daily.       . furosemide (LASIX) 40 MG tablet Take 40 mg by mouth daily.      Marland Kitchen levothyroxine (SYNTHROID, LEVOTHROID) 175 MCG tablet Take 1 tablet (175 mcg total) by mouth daily.  90 tablet  3  . lisinopril (PRINIVIL,ZESTRIL) 20 MG tablet Take 1 tablet (20 mg total) by mouth daily.  90 tablet  4  . spironolactone (ALDACTONE) 50 MG tablet Take 1 tablet (50 mg total) by mouth daily.  90 tablet  3  . tiotropium (SPIRIVA) 18 MCG inhalation capsule Place 1 capsule (18 mcg total) into inhaler and inhale every morning.  90 capsule  3    . albuterol (PROVENTIL HFA;VENTOLIN HFA) 108 (90 BASE) MCG/ACT inhaler Inhale 2 puffs into the lungs every 6 (six) hours as needed for wheezing.  1 Inhaler  2   No current facility-administered medications for this visit.     Past Medical History  Diagnosis Date  . Hypertension   . Genital warts     hx  . Anemia, iron deficiency   . Blood transfusion complicating pregnancy     after first child at 63 yo  . DJD (degenerative joint disease)     hands  . Cervical disc disease     with recurrent radicular pain, Dr. Montez Morita  . COPD (chronic obstructive pulmonary disease) 06/14/2010  . ALLERGIC RHINITIS 03/17/2008  . GOITER, MULTINODULAR 12/23/2009  . Cardiomyopathy     a. remote h/o cath with reported nonobs CAD; b. echo 5/12: EF 15%, mild LVH, mild MR, LAE, mod pericardial effusion with increased filling pressures  . Systolic CHF   . Hypothyroid   . CAD (coronary artery disease)     Nonobstructive by cardiac catheter 8/12:EF 10-15%, circumflex calcified without obstructive CAD, proximal LAD 20%, mid LAD 20%, proximal RCA 20%, mid RCA 20%.  . Myocardial infarction     Past Surgical History  Procedure Laterality Date  . Cholecystectomy    . Cardiac catheterization  1995  with "timy blockage" [facility] Dr. Daisy Floro  . Cardiac defibrillator placement  01-22-11    by Dr.Taylor    History   Social History  . Marital Status: Married    Spouse Name: N/A    Number of Children: 3  . Years of Education: N/A   Occupational History  .     Social History Main Topics  . Smoking status: Former Smoker -- 0.25 packs/day  . Smokeless tobacco: Not on file     Comment: very light, occasional stopped she states now  . Alcohol Use: No  . Drug Use: No  . Sexually Active: No   Other Topics Concern  . Not on file   Social History Narrative  . No narrative on file    ROS: no fevers or chills, productive cough, hemoptysis, dysphasia, odynophagia, melena, hematochezia, dysuria,  hematuria, rash, seizure activity, orthopnea, PND, pedal edema, claudication. Remaining systems are negative.  Physical Exam: Well-developed well-nourished in no acute distress.  Skin is warm and dry.  HEENT is normal.  Neck is supple.  Chest is clear to auscultation with normal expansion. ICD left chest Cardiovascular exam is regular rate and rhythm. 2/6 systolic murmur left sternal border. Abdominal exam nontender or distended. No masses palpated. Extremities show no edema. neuro grossly intact  ECG sinus rhythm with ventricular pacing.

## 2012-09-15 NOTE — Assessment & Plan Note (Signed)
Continue aspirin 

## 2012-09-15 NOTE — Patient Instructions (Addendum)
Your physician wants you to follow-up in: 3 MONTHS WITH DR Jens Som You will receive a reminder letter in the mail two months in advance. If you don't receive a letter, please call our office to schedule the follow-up appointment.   INCREASE LISINOPRIL TO 40 MG ONCE DAILY  Your physician recommends that you return for lab work in: ONE WEEK

## 2012-09-16 ENCOUNTER — Encounter: Payer: Self-pay | Admitting: *Deleted

## 2012-09-22 ENCOUNTER — Other Ambulatory Visit (INDEPENDENT_AMBULATORY_CARE_PROVIDER_SITE_OTHER): Payer: BC Managed Care – PPO

## 2012-09-22 ENCOUNTER — Encounter: Payer: Self-pay | Admitting: *Deleted

## 2012-09-22 DIAGNOSIS — I428 Other cardiomyopathies: Secondary | ICD-10-CM

## 2012-09-22 LAB — BASIC METABOLIC PANEL
Chloride: 103 mEq/L (ref 96–112)
Potassium: 3.8 mEq/L (ref 3.5–5.1)

## 2012-12-17 ENCOUNTER — Encounter: Payer: Self-pay | Admitting: Internal Medicine

## 2013-01-01 ENCOUNTER — Encounter: Payer: Self-pay | Admitting: Internal Medicine

## 2013-01-01 ENCOUNTER — Ambulatory Visit (INDEPENDENT_AMBULATORY_CARE_PROVIDER_SITE_OTHER): Payer: BC Managed Care – PPO | Admitting: *Deleted

## 2013-01-01 DIAGNOSIS — I428 Other cardiomyopathies: Secondary | ICD-10-CM

## 2013-01-01 DIAGNOSIS — I429 Cardiomyopathy, unspecified: Secondary | ICD-10-CM

## 2013-01-01 DIAGNOSIS — Z95810 Presence of automatic (implantable) cardiac defibrillator: Secondary | ICD-10-CM

## 2013-01-01 LAB — ICD DEVICE OBSERVATION
AL AMPLITUDE: 5.25 mv
AL IMPEDENCE ICD: 437 Ohm
AL THRESHOLD: 0.5 V
BAMS-0001: 170 {beats}/min
BATTERY VOLTAGE: 3.0643 V
FVT: 0
PACEART VT: 0
RV LEAD AMPLITUDE: 10.125 mv
RV LEAD IMPEDENCE ICD: 703 Ohm
TOT-0002: 0
TZAT-0001FASTVT: 1
TZAT-0001SLOWVT: 1
TZAT-0002ATACH: NEGATIVE
TZAT-0002ATACH: NEGATIVE
TZAT-0002SLOWVT: NEGATIVE
TZAT-0012ATACH: 150 ms
TZAT-0012ATACH: 150 ms
TZAT-0012FASTVT: 170 ms
TZAT-0012SLOWVT: 170 ms
TZAT-0018FASTVT: NEGATIVE
TZAT-0019ATACH: 6 V
TZAT-0019ATACH: 6 V
TZAT-0019ATACH: 6 V
TZAT-0020ATACH: 1.5 ms
TZAT-0020ATACH: 1.5 ms
TZAT-0020ATACH: 1.5 ms
TZON-0003ATACH: 350 ms
TZON-0003SLOWVT: 330 ms
TZST-0001ATACH: 4
TZST-0001FASTVT: 2
TZST-0001FASTVT: 4
TZST-0001FASTVT: 5
TZST-0001FASTVT: 6
TZST-0001SLOWVT: 3
TZST-0001SLOWVT: 4
TZST-0001SLOWVT: 5
TZST-0002ATACH: NEGATIVE
TZST-0002FASTVT: NEGATIVE
TZST-0002FASTVT: NEGATIVE
TZST-0002FASTVT: NEGATIVE
TZST-0002SLOWVT: NEGATIVE
TZST-0002SLOWVT: NEGATIVE

## 2013-01-01 NOTE — Progress Notes (Signed)
CRT-D + OptiVol check in clinic, all functions normal, see PaceArt for full details.    [facility] 04/06/12 & ROV w/  in Dr. Ladona Ridgel 06/2013.

## 2013-01-15 ENCOUNTER — Other Ambulatory Visit: Payer: Self-pay

## 2013-01-29 ENCOUNTER — Ambulatory Visit: Payer: BC Managed Care – PPO

## 2013-02-03 ENCOUNTER — Ambulatory Visit: Payer: BC Managed Care – PPO

## 2013-04-06 ENCOUNTER — Encounter: Payer: BC Managed Care – PPO | Admitting: *Deleted

## 2013-04-17 ENCOUNTER — Encounter: Payer: Self-pay | Admitting: *Deleted

## 2013-06-30 ENCOUNTER — Encounter: Payer: BC Managed Care – PPO | Admitting: Internal Medicine

## 2013-07-02 ENCOUNTER — Encounter: Payer: Self-pay | Admitting: Internal Medicine

## 2013-07-22 ENCOUNTER — Encounter: Payer: Self-pay | Admitting: *Deleted

## 2013-09-02 ENCOUNTER — Encounter: Payer: Self-pay | Admitting: Internal Medicine

## 2013-09-02 ENCOUNTER — Ambulatory Visit (INDEPENDENT_AMBULATORY_CARE_PROVIDER_SITE_OTHER): Payer: BC Managed Care – PPO | Admitting: Internal Medicine

## 2013-09-02 VITALS — BP 148/88 | HR 60 | Ht 65.0 in | Wt 158.0 lb

## 2013-09-02 DIAGNOSIS — I5022 Chronic systolic (congestive) heart failure: Secondary | ICD-10-CM

## 2013-09-02 DIAGNOSIS — I429 Cardiomyopathy, unspecified: Secondary | ICD-10-CM

## 2013-09-02 DIAGNOSIS — I428 Other cardiomyopathies: Secondary | ICD-10-CM

## 2013-09-02 DIAGNOSIS — Z95810 Presence of automatic (implantable) cardiac defibrillator: Secondary | ICD-10-CM

## 2013-09-02 LAB — MDC_IDC_ENUM_SESS_TYPE_INCLINIC
Battery Voltage: 3 V
Brady Statistic AP VP Percent: 31.02 %
Brady Statistic AP VS Percent: 0.03 %
Brady Statistic AS VP Percent: 68.8 %
Date Time Interrogation Session: 20150624164140
HighPow Impedance: 45 Ohm
HighPow Impedance: 57 Ohm
Lead Channel Impedance Value: 380 Ohm
Lead Channel Impedance Value: 380 Ohm
Lead Channel Impedance Value: 532 Ohm
Lead Channel Impedance Value: 779 Ohm
Lead Channel Pacing Threshold Amplitude: 0.5 V
Lead Channel Pacing Threshold Pulse Width: 0.4 ms
Lead Channel Sensing Intrinsic Amplitude: 2.75 mV
Lead Channel Sensing Intrinsic Amplitude: 2.875 mV
Lead Channel Sensing Intrinsic Amplitude: 8.5 mV
Lead Channel Setting Sensing Sensitivity: 0.3 mV
MDC IDC MSMT LEADCHNL LV PACING THRESHOLD AMPLITUDE: 1 V
MDC IDC MSMT LEADCHNL LV PACING THRESHOLD PULSEWIDTH: 0.6 ms
MDC IDC MSMT LEADCHNL RV IMPEDANCE VALUE: 589 Ohm
MDC IDC MSMT LEADCHNL RV PACING THRESHOLD AMPLITUDE: 0.75 V
MDC IDC MSMT LEADCHNL RV PACING THRESHOLD PULSEWIDTH: 0.4 ms
MDC IDC MSMT LEADCHNL RV SENSING INTR AMPL: 7.75 mV
MDC IDC SET LEADCHNL LV PACING AMPLITUDE: 2 V
MDC IDC SET LEADCHNL LV PACING PULSEWIDTH: 0.6 ms
MDC IDC SET LEADCHNL RA PACING AMPLITUDE: 2 V
MDC IDC SET LEADCHNL RV PACING AMPLITUDE: 2.5 V
MDC IDC SET LEADCHNL RV PACING PULSEWIDTH: 0.4 ms
MDC IDC SET ZONE DETECTION INTERVAL: 330 ms
MDC IDC SET ZONE DETECTION INTERVAL: 360 ms
MDC IDC STAT BRADY AS VS PERCENT: 0.15 %
MDC IDC STAT BRADY RA PERCENT PACED: 31.05 %
MDC IDC STAT BRADY RV PERCENT PACED: 99.82 %
Zone Setting Detection Interval: 310 ms
Zone Setting Detection Interval: 350 ms

## 2013-09-02 NOTE — Assessment & Plan Note (Signed)
She is sedentary and I have asked that she increase her physical activity. She will maintain a low sodium diet.

## 2013-09-02 NOTE — Progress Notes (Signed)
HPI [name] returns today for followup. She is a very pleasant 63 year old woman with a nonischemic cardiomyopathy left bundle branch block, status post permanent pacemaker insertion. In the interim, she has been stable except for some diaphragmatic stimulation. Her heart failure symptoms are well-controlled. She denies chest pain or shortness of breath. No syncope. We reprogrammed her device today in hopes of minimizing her stimulation. Allergies  Allergen Reactions  . Ivp Dye [Iodinated Diagnostic Agents] Other (See Comments)    Reaction: rash, itching and peel  . Penicillins Other (See Comments)    REACTION: rash, swelling  . Latex Rash    Allergic to latex gloves  . Strawberry Other (See Comments)    Reaction: rash, swelling     Current Outpatient Prescriptions  Medication Sig Dispense Refill  . aspirin 81 MG EC tablet Take 81 mg by mouth daily.       . budesonide-formoterol (SYMBICORT) 160-4.5 MCG/ACT inhaler Inhale 2 puffs into the lungs 2 (two) times daily.  1 Inhaler  11  . carvedilol (COREG) 12.5 MG tablet Take 1 tablet (12.5 mg total) by mouth 2 (two) times daily with a meal.  180 tablet  4  . Cholecalciferol (VITAMIN D3) 1000 UNITS CAPS Take 1,000 Units by mouth daily.       . fexofenadine (ALLEGRA) 180 MG tablet Take 180 mg by mouth daily.        . fluticasone (VERAMYST) 27.5 MCG/SPRAY nasal spray Place 2 sprays into the nose daily.       . furosemide (LASIX) 40 MG tablet Take 40 mg by mouth daily.      Marland Kitchen levothyroxine (SYNTHROID, LEVOTHROID) 175 MCG tablet Take 1 tablet (175 mcg total) by mouth daily.  90 tablet  3  . lisinopril (PRINIVIL,ZESTRIL) 40 MG tablet Take 1 tablet (40 mg total) by mouth daily.  90 tablet  4  . spironolactone (ALDACTONE) 50 MG tablet Take 1 tablet (50 mg total) by mouth daily.  90 tablet  3  . tiotropium (SPIRIVA) 18 MCG inhalation capsule Place 1 capsule (18 mcg total) into inhaler and inhale every morning.  90 capsule  3  . albuterol (PROVENTIL  HFA;VENTOLIN HFA) 108 (90 BASE) MCG/ACT inhaler Inhale 2 puffs into the lungs every 6 (six) hours as needed for wheezing.  1 Inhaler  2   No current facility-administered medications for this visit.     Past Medical History  Diagnosis Date  . Hypertension   . Genital warts     hx  . Anemia, iron deficiency   . Blood transfusion complicating pregnancy     after first child at 14 yo  . DJD (degenerative joint disease)     hands  . Cervical disc disease     with recurrent radicular pain, Dr. Montez Morita  . COPD (chronic obstructive pulmonary disease) 06/14/2010  . ALLERGIC RHINITIS 03/17/2008  . GOITER, MULTINODULAR 12/23/2009  . Cardiomyopathy     a. remote h/o cath with reported nonobs CAD; b. echo 5/12: EF 15%, mild LVH, mild MR, LAE, mod pericardial effusion with increased filling pressures  . Systolic CHF   . Hypothyroid   . CAD (coronary artery disease)     Nonobstructive by cardiac catheter 8/12:EF 10-15%, circumflex calcified without obstructive CAD, proximal LAD 20%, mid LAD 20%, proximal RCA 20%, mid RCA 20%.  . Myocardial infarction     ROS:   All systems reviewed and negative except as noted in the HPI.   Past Surgical History  Procedure Laterality  Date  . Cholecystectomy    . Cardiac [facility]  [facility]    [facility] Daisy Flor[facility]  . Cardiac defibrillator placement  01-22-11    by Dr.Taylor     Family History  Problem Relation Age of Onset  . Stroke Mother   . Heart disease Mother   . Hypertension Mother   . Diabetes Mother   . Stroke Father   . Heart disease Father   . Hypertension Father   . Diabetes Father   . Cancer Sister     colon  . Heart disease Sister     pacemaker  . Hypertension Sister   . Depression Sister   . Diabetes Sister   . Thyroid disease Sister     uncertain type  . Transient ischemic attack Maternal Grandfather   . Alcohol abuse Other   . Malignant hyperthermia Other      History   Social History  .  Marital Status: Married    Spouse Name: N/A    Number of Children: 3  . Years of Education: N/A   Occupational History  .     Social History Main Topics  . Smoking status: Former Smoker -- 0.25 packs/day  . Smokeless tobacco: Not on file     Comment: very light, occasional stopped she states now  . Alcohol Use: No  . Drug Use: No  . Sexual Activity: No   Other Topics Concern  . Not on file   Social History Narrative  . No narrative on file     BP 148/88  Pulse 60  Ht 5\' 5"  (1.651 m)  Wt 158 lb (71.668 kg)  BMI 26.29 kg/m2  Physical Exam:  Well appearing 63 year old woman, NAD HEENT: Unremarkable Neck:  7 cm JVD, no thyromegally Back:  No CVA tenderness Lungs:  Clear with no wheezes, rales, or rhonchi. HEART:  Regular rate rhythm, no murmurs, no rubs, no clicks Abd:  soft, positive bowel sounds, no organomegally, no rebound, no guarding Ext:  2 plus pulses, no edema, no cyanosis, no clubbing Skin:  No rashes no nodules Neuro:  CN II through XII intact, motor grossly intact  DEVICE  Normal device function.  See PaceArt for details.   Assess/Plan:

## 2013-09-02 NOTE — Patient Instructions (Signed)
Your physician wants you to follow-up in: 1 YEAR WITH DR. TAYLOR...You will receive a reminder letter in the mail two months in advance. If you don't receive a letter, please call our office to schedule the follow-up appointment.  

## 2013-09-02 NOTE — Assessment & Plan Note (Signed)
Her Medtronic BiV ICD is working normally except for diaphragmatic stimulation which we have programmed around. Will recheck her device in several months.

## 2013-12-25 ENCOUNTER — Other Ambulatory Visit: Payer: Self-pay

## 2014-02-18 ENCOUNTER — Encounter (HOSPITAL_COMMUNITY): Payer: Self-pay | Admitting: Internal Medicine

## 2014-05-20 ENCOUNTER — Other Ambulatory Visit: Payer: Self-pay | Admitting: Internal Medicine

## 2014-05-21 ENCOUNTER — Other Ambulatory Visit: Payer: Self-pay

## 2014-05-31 ENCOUNTER — Telehealth: Payer: Self-pay | Admitting: Internal Medicine

## 2014-05-31 NOTE — Telephone Encounter (Signed)
Patient need refill of Levothyroxine 175 mg

## 2014-05-31 NOTE — Telephone Encounter (Signed)
PT HASN'T SEEN MD SINCE "2013" NEED OV...Raechel Chute

## 2014-06-02 ENCOUNTER — Ambulatory Visit (INDEPENDENT_AMBULATORY_CARE_PROVIDER_SITE_OTHER): Payer: Medicare PPO | Admitting: Internal Medicine

## 2014-06-02 VITALS — BP 132/80 | HR 69 | Temp 98.2°F | Resp 18 | Ht 65.0 in | Wt 146.0 lb

## 2014-06-02 DIAGNOSIS — Z0000 Encounter for general adult medical examination without abnormal findings: Secondary | ICD-10-CM | POA: Diagnosis not present

## 2014-06-02 DIAGNOSIS — Z23 Encounter for immunization: Secondary | ICD-10-CM

## 2014-06-02 DIAGNOSIS — I10 Essential (primary) hypertension: Secondary | ICD-10-CM | POA: Diagnosis not present

## 2014-06-02 DIAGNOSIS — I429 Cardiomyopathy, unspecified: Secondary | ICD-10-CM

## 2014-06-02 NOTE — Assessment & Plan Note (Signed)

## 2014-06-02 NOTE — Assessment & Plan Note (Signed)
Needs referral back to Dr Jens Som, missed 2015

## 2014-06-02 NOTE — Patient Instructions (Addendum)
You had the new Prevnar pneumonia shot today  We can do the flu shot in the fall, then the Pneumovax in 1 year  Please continue all other medications as before, and refills have been done if requested.  Please have the pharmacy call with any other refills you may need.  Please continue your efforts at being more active, low cholesterol diet, and weight control.  You are otherwise up to date with prevention measures today.  Please keep your appointments with your specialists as you may have planned  Please go to the LAB in the Basement (turn left off the elevator) for the tests to be done at your convenience  You will be contacted by phone if any changes need to be made immediately.  Otherwise, you will receive a letter about your results with an explanation, but please check with MyChart first.  Please remember to sign up for MyChart if you have not done so, as this will be important to you in the future with finding out test results, communicating by private email, and scheduling acute appointments online when needed.  Please return in 6 months, or sooner if needed

## 2014-06-02 NOTE — Progress Notes (Signed)
Pre visit review using our clinic review tool, if applicable. No additional management support is needed unless otherwise documented below in the visit note. 

## 2014-06-02 NOTE — Addendum Note (Signed)
Addended by: Maurene Capes on: 06/02/2014 06:44 PM   Modules accepted: Orders

## 2014-06-02 NOTE — Assessment & Plan Note (Signed)
stable overall by history and exam, recent data reviewed with pt, and pt to continue medical treatment as before,  to f/u any worsening symptoms or concerns BP Readings from Last 3 Encounters:  06/02/14 132/80  09/02/13 148/88  09/15/12 150/89

## 2014-06-02 NOTE — Progress Notes (Signed)
Subjective:    Patient ID: [name], female    DOB: April 12, 1950, 64 y.o.   MRN: [id number]  HPI  Here for wellness and f/u;  Overall doing ok;  Pt denies Chest pain, worsening SOB, DOE, wheezing,, PND, worsening LE edema, palpitations, dizziness or syncope, but has orthopnea chronic with lying flat for more than 30 seconds..  Pt denies neurological change such as new headache, facial or extremity weakness.  Pt denies polydipsia, polyuria, or low sugar symptoms. Pt states overall good compliance with treatment and medications, good tolerability, and has been trying to follow appropriate diet.  Pt denies worsening depressive symptoms, suicidal ideation or panic. No fever, night sweats, wt loss, loss of appetite, or other constitutional symptoms.  Pt states aifr ability with ADL's, has low fall risk, home safety reviewed and adequate, no other significant changes in hearing or vision, and only occasionally active with exercise, but can ride an outside bicycle up and down the block one time Past Medical History  Diagnosis Date  . Hypertension   . Genital warts     hx  . Anemia, iron deficiency   . Blood transfusion complicating pregnancy     after first child at 32 yo  . DJD (degenerative joint disease)     hands  . Cervical disc disease     with recurrent radicular pain, Dr. Montez Morita  . COPD (chronic obstructive pulmonary disease) 06/14/2010  . ALLERGIC RHINITIS 03/17/2008  . GOITER, MULTINODULAR 12/23/2009  . Cardiomyopathy     a. remote h/o cath with reported nonobs CAD; b. echo 5/12: EF 15%, mild LVH, mild MR, LAE, mod pericardial effusion with increased filling pressures  . Systolic CHF   . Hypothyroid   . CAD (coronary artery disease)     Nonobstructive by cardiac catheter 8/12:EF 10-15%, circumflex calcified without obstructive CAD, proximal LAD 20%, mid LAD 20%, proximal RCA 20%, mid RCA 20%.  . Myocardial infarction    Past Surgical History  Procedure Laterality Date  .  Cholecystectomy    . Cardiac catheterization  1995    with "timy blockage" [facility] Dr. Daisy Floro  . Cardiac defibrillator placement  01-22-11    by Dr.Taylor  . Implantable cardioverter defibrillator implant N/A 01/22/2011    Procedure: IMPLANTABLE CARDIOVERTER DEFIBRILLATOR IMPLANT;  Surgeon: Marinus Maw, MD;  Location: [facility] CATH LAB;  Service: Cardiovascular;  Laterality: N/A;    reports that she has quit smoking. She does not have any smokeless tobacco history on file. She reports that she does not drink alcohol or use illicit drugs. family history includes Alcohol abuse in her other; Cancer in her sister; Depression in her sister; Diabetes in her father, mother, and sister; Heart disease in her father, mother, and sister; Hypertension in her father, mother, and sister; Malignant hyperthermia in her other; Stroke in her father and mother; Thyroid disease in her sister; Transient ischemic attack in her maternal grandfather. Allergies  Allergen Reactions  . Ivp Dye [Iodinated Diagnostic Agents] Other (See Comments)    Reaction: rash, itching and peel  . Penicillins Other (See Comments)    REACTION: rash, swelling  . Latex Rash    Allergic to latex gloves  . Strawberry Other (See Comments)    Reaction: rash, swelling   Current Outpatient Prescriptions on File Prior to Visit  Medication Sig Dispense Refill  . aspirin 81 MG EC tablet Take 81 mg by mouth daily.     . carvedilol (COREG) 12.5 MG tablet Take 1 tablet (  12.5 mg total) by mouth 2 (two) times daily with a meal. 180 tablet 4  . Cholecalciferol (VITAMIN D3) 1000 UNITS CAPS Take 1,000 Units by mouth daily.     . fexofenadine (ALLEGRA) 180 MG tablet Take 180 mg by mouth daily.      . fluticasone (VERAMYST) 27.5 MCG/SPRAY nasal spray Place 2 sprays into the nose daily.     . furosemide (LASIX) 40 MG tablet Take 40 mg by mouth daily.    Marland Kitchen levothyroxine (SYNTHROID, LEVOTHROID) 175 MCG tablet Take 1 tablet (175 mcg total) by mouth  daily. 90 tablet 3  . lisinopril (PRINIVIL,ZESTRIL) 40 MG tablet Take 1 tablet (40 mg total) by mouth daily. 90 tablet 4  . spironolactone (ALDACTONE) 50 MG tablet Take 1 tablet (50 mg total) by mouth daily. 90 tablet 3  . tiotropium (SPIRIVA) 18 MCG inhalation capsule Place 1 capsule (18 mcg total) into inhaler and inhale every morning. 90 capsule 3  . albuterol (PROVENTIL HFA;VENTOLIN HFA) 108 (90 BASE) MCG/ACT inhaler Inhale 2 puffs into the lungs every 6 (six) hours as needed for wheezing. 1 Inhaler 2  . budesonide-formoterol (SYMBICORT) 160-4.5 MCG/ACT inhaler Inhale 2 puffs into the lungs 2 (two) times daily. (Patient not taking: Reported on 06/02/2014) 1 Inhaler 11   No current facility-administered medications on file prior to visit.   Review of Systems Constitutional: Negative for increased diaphoresis, other activity, appetite or siginficant weight change other than noted HENT: Negative for worsening hearing loss, ear pain, facial swelling, mouth sores and neck stiffness.   Eyes: Negative for other worsening pain, redness or visual disturbance.  Respiratory: Negative for shortness of breath and wheezing  Cardiovascular: Negative for chest pain and palpitations.  Gastrointestinal: Negative for diarrhea, blood in stool, abdominal distention or other pain Genitourinary: Negative for hematuria, flank pain or change in urine volume.  Musculoskeletal: Negative for myalgias or other joint complaints.  Skin: Negative for color change and wound or drainage.  Neurological: Negative for syncope and numbness. other than noted Hematological: Negative for adenopathy. or other swelling Psychiatric/Behavioral: Negative for hallucinations, SI, self-injury, decreased concentration or other worsening agitation.      Objective:   Physical Exam BP 132/80 mmHg  Pulse 69  Temp(Src) 98.2 F (36.8 C) (Oral)  Resp 18  Ht  (1.651 m)  Wt 146 lb 0.6 oz (66.243 kg)  BMI 24.30 kg/m2  SpO2 97% VS  noted,  Constitutional: Pt is oriented to person, place, and time. Appears well-developed and well-nourished, in no significant distress Head: Normocephalic and atraumatic.  Right Ear: External ear normal.  Left Ear: External ear normal.  Nose: Nose normal.  Mouth/Throat: Oropharynx is clear and moist.  Eyes: Conjunctivae and EOM are normal. Pupils are equal, round, and reactive to light.  Neck: Normal range of motion. Neck supple. No JVD present. No tracheal deviation present or significant neck LA or mass Cardiovascular: Normal rate, regular rhythm, normal heart sounds and intact distal pulses.   Pulmonary/Chest: Effort normal and breath sounds without rales or wheezing  Abdominal: Soft. Bowel sounds are normal. NT. No HSM  Musculoskeletal: Normal range of motion. Exhibits no edema.  Lymphadenopathy:  Has no cervical adenopathy.  Neurological: Pt is alert and oriented to person, place, and time. Pt has normal reflexes. No cranial nerve deficit. Motor grossly intact Skin: Skin is warm and dry. No rash noted.  Psychiatric:  Has normal mood and affect. Behavior is normal.     Assessment & Plan:

## 2014-06-03 ENCOUNTER — Encounter: Payer: Self-pay | Admitting: Internal Medicine

## 2014-06-10 ENCOUNTER — Telehealth: Payer: Self-pay | Admitting: Internal Medicine

## 2014-06-10 NOTE — Telephone Encounter (Signed)
Patient was seen on 03/23 and some prescriptions were supposed to be sent to [facility] on [address]. And the pharmacy has not received them yet.

## 2014-06-11 ENCOUNTER — Other Ambulatory Visit: Payer: Self-pay

## 2014-06-11 DIAGNOSIS — I428 Other cardiomyopathies: Secondary | ICD-10-CM

## 2014-06-11 MED ORDER — LEVOTHYROXINE SODIUM 175 MCG PO TABS: 175.0000 ug | ORAL_TABLET | Freq: Every day | ORAL | Status: DC

## 2014-06-11 NOTE — Telephone Encounter (Signed)
Done

## 2014-07-29 ENCOUNTER — Encounter: Payer: Medicare PPO | Admitting: Internal Medicine

## 2014-08-31 ENCOUNTER — Telehealth: Payer: Self-pay | Admitting: *Deleted

## 2014-08-31 NOTE — Telephone Encounter (Signed)
called for fm status...no answer

## 2014-09-02 ENCOUNTER — Encounter: Payer: Self-pay | Admitting: Internal Medicine

## 2014-09-02 ENCOUNTER — Ambulatory Visit (INDEPENDENT_AMBULATORY_CARE_PROVIDER_SITE_OTHER): Payer: Medicare PPO | Admitting: Internal Medicine

## 2014-09-02 VITALS — BP 140/74 | HR 65 | Ht 65.0 in | Wt 149.4 lb

## 2014-09-02 DIAGNOSIS — I429 Cardiomyopathy, unspecified: Secondary | ICD-10-CM | POA: Diagnosis not present

## 2014-09-02 DIAGNOSIS — Z95810 Presence of automatic (implantable) cardiac defibrillator: Secondary | ICD-10-CM | POA: Diagnosis not present

## 2014-09-02 DIAGNOSIS — I10 Essential (primary) hypertension: Secondary | ICD-10-CM | POA: Diagnosis not present

## 2014-09-02 DIAGNOSIS — I5022 Chronic systolic (congestive) heart failure: Secondary | ICD-10-CM

## 2014-09-02 LAB — CUP PACEART INCLINIC DEVICE CHECK
Battery Voltage: 2.91 V
Brady Statistic AP VP Percent: 34.19 %
Brady Statistic AP VS Percent: 0.03 %
Brady Statistic RA Percent Paced: 34.22 %
Brady Statistic RV Percent Paced: 99.94 %
HighPow Impedance: 41 Ohm
HighPow Impedance: 53 Ohm
Lead Channel Impedance Value: 342 Ohm
Lead Channel Impedance Value: 418 Ohm
Lead Channel Impedance Value: 418 Ohm
Lead Channel Impedance Value: 551 Ohm
Lead Channel Impedance Value: 589 Ohm
Lead Channel Pacing Threshold Amplitude: 0.5 V
Lead Channel Pacing Threshold Pulse Width: 0.4 ms
Lead Channel Sensing Intrinsic Amplitude: 2.75 mV
Lead Channel Sensing Intrinsic Amplitude: 8.375 mV
Lead Channel Setting Pacing Amplitude: 2 V
Lead Channel Setting Pacing Amplitude: 2.5 V
Lead Channel Setting Pacing Pulse Width: 0.4 ms
Lead Channel Setting Pacing Pulse Width: 0.6 ms
Lead Channel Setting Sensing Sensitivity: 0.3 mV
MDC IDC MSMT LEADCHNL LV PACING THRESHOLD AMPLITUDE: 1.5 V
MDC IDC MSMT LEADCHNL LV PACING THRESHOLD PULSEWIDTH: 0.6 ms
MDC IDC MSMT LEADCHNL RV PACING THRESHOLD AMPLITUDE: 0.75 V
MDC IDC MSMT LEADCHNL RV PACING THRESHOLD PULSEWIDTH: 0.4 ms
MDC IDC SESS DTM: 20160623115936
MDC IDC SET LEADCHNL LV PACING AMPLITUDE: 2 V
MDC IDC SET ZONE DETECTION INTERVAL: 330 ms
MDC IDC SET ZONE DETECTION INTERVAL: 360 ms
MDC IDC STAT BRADY AS VP PERCENT: 65.76 %
MDC IDC STAT BRADY AS VS PERCENT: 0.03 %
Zone Setting Detection Interval: 310 ms
Zone Setting Detection Interval: 350 ms

## 2014-09-02 NOTE — Patient Instructions (Signed)
Medication Instructions:  Your physician recommends that you continue on your current medications as directed. Please refer to the Current Medication list given to you today.   Labwork: NONE  Testing/Procedures: NONE  Follow-Up: Remote monitoring is used to monitor your Pacemaker or ICD from home. This monitoring reduces the number of office visits required to check your device to one time per year. It allows [place] to keep an eye on the functioning of your device to ensure it is working properly. You are scheduled for a device check from home on 12/02/2014. You may send your transmission at any time that day. If you have a wireless device, the transmission will be sent automatically. After your physician reviews your transmission, you will receive a postcard with your next transmission date.  Your physician wants you to follow-up in: 12 months with Dr. Ladona Ridgel. You will receive a reminder letter in the mail two months in advance. If you don't receive a letter, please call our office to schedule the follow-up appointment.   Any Other Special Instructions Will Be Listed Below (If Applicable).

## 2014-09-03 NOTE — Assessment & Plan Note (Signed)
Her symptoms are well controlled. She has been encouraged to maintain a low sodium diet.

## 2014-09-03 NOTE — Assessment & Plan Note (Signed)
Her Medtronic BiV device is working normally. Will recheck in several months. She has approx 1 year of longevity.

## 2014-09-03 NOTE — Progress Notes (Signed)
HPI [name] returns today for followup. She is a pleasant 64 yo woman with a non-ischemic CM, chronic systolic heart failure, LBBB, s/p BiV ICD implant. In the interim, she has not been bothered by diaphragmatic stimulation. She has approximately 1 year of battery longevity. She denies any ICD shock and she has not been in the hospital with heart failure.  Allergies  Allergen Reactions  . Ivp Dye [Iodinated Diagnostic Agents] Other (See Comments)    Reaction: rash, itching and peel  . Penicillins Other (See Comments)    REACTION: rash, swelling  . Latex Rash    Allergic to latex gloves  . Strawberry Other (See Comments)    Reaction: rash, swelling     Current Outpatient Prescriptions  Medication Sig Dispense Refill  . albuterol (PROVENTIL HFA;VENTOLIN HFA) 108 (90 BASE) MCG/ACT inhaler Inhale 2 puffs into the lungs every 6 (six) hours as needed for wheezing. 1 Inhaler 2  . aspirin 81 MG EC tablet Take 81 mg by mouth daily.     . budesonide-formoterol (SYMBICORT) 160-4.5 MCG/ACT inhaler Inhale 2 puffs into the lungs 2 (two) times daily. 1 Inhaler 11  . carvedilol (COREG) 12.5 MG tablet Take 1 tablet (12.5 mg total) by mouth 2 (two) times daily with a meal. 180 tablet 4  . Cholecalciferol (VITAMIN D3) 1000 UNITS CAPS Take 1,000 Units by mouth daily.     . fexofenadine (ALLEGRA) 180 MG tablet Take 180 mg by mouth daily.      . fluticasone (VERAMYST) 27.5 MCG/SPRAY nasal spray Place 2 sprays into the nose daily.     . furosemide (LASIX) 40 MG tablet Take 40 mg by mouth daily.    Marland Kitchen lisinopril (PRINIVIL,ZESTRIL) 40 MG tablet Take 1 tablet (40 mg total) by mouth daily. 90 tablet 4  . spironolactone (ALDACTONE) 50 MG tablet Take 1 tablet (50 mg total) by mouth daily. 90 tablet 3  . levothyroxine (SYNTHROID, LEVOTHROID) 175 MCG tablet Take 1 tablet (175 mcg total) by mouth daily. (Patient not taking: Reported on 09/02/2014) 90 tablet 3  . tiotropium (SPIRIVA) 18 MCG inhalation capsule  Place 1 capsule (18 mcg total) into inhaler and inhale every morning. (Patient not taking: Reported on 09/02/2014) 90 capsule 3   No current facility-administered medications for this visit.     Past Medical History  Diagnosis Date  . Hypertension   . Genital warts     hx  . Anemia, iron deficiency   . Blood transfusion complicating pregnancy     after first child at 19 yo  . DJD (degenerative joint disease)     hands  . Cervical disc disease     with recurrent radicular pain, Dr. Montez Morita  . COPD (chronic obstructive pulmonary disease) 06/14/2010  . ALLERGIC RHINITIS 03/17/2008  . GOITER, MULTINODULAR 12/23/2009  . Cardiomyopathy     a. remote h/o cath with reported nonobs CAD; b. echo 5/12: EF 15%, mild LVH, mild MR, LAE, mod pericardial effusion with increased filling pressures  . Systolic CHF   . Hypothyroid   . CAD (coronary artery disease)     Nonobstructive by cardiac catheter 8/12:EF 10-15%, circumflex calcified without obstructive CAD, proximal LAD 20%, mid LAD 20%, proximal RCA 20%, mid RCA 20%.  . Myocardial infarction     ROS:   All systems reviewed and negative except as noted in the HPI.   Past Surgical History  Procedure Laterality Date  . Cholecystectomy    . Cardiac catheterization  1995  with "timy blockage" [facility] Dr. Daisy Floro  . Cardiac defibrillator placement  01-22-11    by Dr.Taylor  . Implantable cardioverter defibrillator implant N/A 01/22/2011    Procedure: IMPLANTABLE CARDIOVERTER DEFIBRILLATOR IMPLANT;  Surgeon: Marinus Maw, MD;  Location: [facility] CATH LAB;  Service: Cardiovascular;  Laterality: N/A;     Family History  Problem Relation Age of Onset  . Stroke Mother   . Heart disease Mother   . Hypertension Mother   . Diabetes Mother   . Stroke Father   . Heart disease Father   . Hypertension Father   . Diabetes Father   . Cancer Sister     colon  . Heart disease Sister     pacemaker  . Hypertension Sister   . Depression Sister    . Diabetes Sister   . Thyroid disease Sister     uncertain type  . Transient ischemic attack Maternal Grandfather   . Alcohol abuse Other   . Malignant hyperthermia Other      History   Social History  . Marital Status: Married    Spouse Name: N/A  . Number of Children: 3  . Years of Education: N/A   Occupational History  .     Social History Main Topics  . Smoking status: Former Smoker -- 0.25 packs/day  . Smokeless tobacco: Not on file     Comment: very light, occasional stopped she states now  . Alcohol Use: No  . Drug Use: No  . Sexual Activity: No   Other Topics Concern  . Not on file   Social History Narrative     BP 140/74 mmHg  Pulse 65  Ht  (1.651 m)  Wt 149 lb 6.4 oz (67.767 kg)  BMI 24.86 kg/m2  Physical Exam:  Well appearing 64 yo woman, NAD HEENT: Unremarkable Neck:  7 cm JVD, no thyromegally Back:  No CVA tenderness Lungs:  Clear with no wheezes HEART:  Regular rate rhythm, no murmurs, no rubs, no clicks Abd:  soft, positive bowel sounds, no organomegally, no rebound, no guarding Ext:  2 plus pulses, no edema, no cyanosis, no clubbing Skin:  No rashes no nodules Neuro:  CN II through XII intact, motor grossly intact  EKG - NSR with BiV pacing  DEVICE  Normal device function.  See PaceArt for details.   Assess/Plan:

## 2014-09-03 NOTE — Assessment & Plan Note (Signed)
Her blood pressure is fairly well controlled. Will followed.

## 2014-09-07 NOTE — Progress Notes (Signed)
HPI: FU dilated cardiomyopathy. She was in the hospital in March 2012 with severe hypothyroidism and congestive heart failure. She was admitted again in 5/12 with a/c systolic CHF. Her TSH had normalized with treatment of her hypothyroidism. Followup echocardiogram in June 2012 demonstrated continued LV dysfunction with an EF of 15%. Cardiac catheterization was performed 10/25/10: EF 10-15%, circumflex calcified without obstructive CAD, proximal LAD 20%, mid LAD 20%, proximal RCA 20%, mid RCA 20%. She was felt to have mild CAD and a nonischemic cardiomyopathy. She had ICD placed 11/12. Since I last saw her, she has some orthopnea but denies dyspnea on exertion, PND, pedal edema, chest pain or syncope.  Current Outpatient Prescriptions  Medication Sig Dispense Refill  . aspirin 81 MG EC tablet Take 81 mg by mouth daily.     . carvedilol (COREG) 12.5 MG tablet Take 1 tablet (12.5 mg total) by mouth 2 (two) times daily with a meal. 180 tablet 4  . Cholecalciferol (VITAMIN D3) 1000 UNITS CAPS Take 1,000 Units by mouth daily.     . fexofenadine (ALLEGRA) 180 MG tablet Take 180 mg by mouth daily.      . fluticasone (VERAMYST) 27.5 MCG/SPRAY nasal spray Place 2 sprays into the nose daily.     . furosemide (LASIX) 40 MG tablet Take 40 mg by mouth daily.    Marland Kitchen levothyroxine (SYNTHROID, LEVOTHROID) 175 MCG tablet Take 1 tablet (175 mcg total) by mouth daily. 90 tablet 3  . lisinopril (PRINIVIL,ZESTRIL) 40 MG tablet Take 1 tablet (40 mg total) by mouth daily. 90 tablet 4  . spironolactone (ALDACTONE) 50 MG tablet Take 1 tablet (50 mg total) by mouth daily. 90 tablet 3  . tiotropium (SPIRIVA) 18 MCG inhalation capsule Place 1 capsule (18 mcg total) into inhaler and inhale every morning. 90 capsule 3  . albuterol (PROVENTIL HFA;VENTOLIN HFA) 108 (90 BASE) MCG/ACT inhaler Inhale 2 puffs into the lungs every 6 (six) hours as needed for wheezing. 1 Inhaler 2   No current facility-administered medications for  this visit.     Past Medical History  Diagnosis Date  . Hypertension   . Genital warts     hx  . Anemia, iron deficiency   . Blood transfusion complicating pregnancy     after first child at 5 yo  . DJD (degenerative joint disease)     hands  . Cervical disc disease     with recurrent radicular pain, Dr. Montez Morita  . COPD (chronic obstructive pulmonary disease) 06/14/2010  . ALLERGIC RHINITIS 03/17/2008  . GOITER, MULTINODULAR 12/23/2009  . Cardiomyopathy     a. remote h/o cath with reported nonobs CAD; b. echo 5/12: EF 15%, mild LVH, mild MR, LAE, mod pericardial effusion with increased filling pressures  . Systolic CHF   . Hypothyroid   . CAD (coronary artery disease)     Nonobstructive by cardiac catheter 8/12:EF 10-15%, circumflex calcified without obstructive CAD, proximal LAD 20%, mid LAD 20%, proximal RCA 20%, mid RCA 20%.  . Myocardial infarction     Past Surgical History  Procedure Laterality Date  . Cholecystectomy    . Cardiac catheterization  1995    with "timy blockage" [facility] Dr. Daisy Floro  . Cardiac defibrillator placement  01-22-11    by Dr.Taylor  . Implantable cardioverter defibrillator implant N/A 01/22/2011    Procedure: IMPLANTABLE CARDIOVERTER DEFIBRILLATOR IMPLANT;  Surgeon: Marinus Maw, MD;  Location: [facility] CATH LAB;  Service: Cardiovascular;  Laterality: N/A;  History   Social History  . Marital Status: Married    Spouse Name: N/A  . Number of Children: 3  . Years of Education: N/A   Occupational History  .     Social History Main Topics  . Smoking status: Former Smoker -- 0.25 packs/day  . Smokeless tobacco: Not on file     Comment: very light, occasional stopped she states now  . Alcohol Use: No  . Drug Use: No  . Sexual Activity: No   Other Topics Concern  . Not on file   Social History Narrative    ROS: no fevers or chills, productive cough, hemoptysis, dysphasia, odynophagia, melena, hematochezia, dysuria, hematuria, rash,  seizure activity, orthopnea, PND, pedal edema, claudication. Remaining systems are negative.  Physical Exam: Well-developed well-nourished in no acute distress.  Skin is warm and dry.  HEENT is normal.  Neck is supple.  Chest is clear to auscultation with normal expansion.  Cardiovascular exam is regular rate and rhythm.  Abdominal exam nontender or distended. No masses palpated. Extremities show no edema. neuro grossly intact

## 2014-09-09 ENCOUNTER — Encounter: Payer: Self-pay | Admitting: Cardiology

## 2014-09-09 ENCOUNTER — Ambulatory Visit (INDEPENDENT_AMBULATORY_CARE_PROVIDER_SITE_OTHER): Payer: Medicare PPO | Admitting: Cardiology

## 2014-09-09 ENCOUNTER — Encounter: Payer: Self-pay | Admitting: *Deleted

## 2014-09-09 VITALS — BP 140/82 | HR 67 | Ht 65.0 in | Wt 149.8 lb

## 2014-09-09 DIAGNOSIS — I428 Other cardiomyopathies: Secondary | ICD-10-CM

## 2014-09-09 DIAGNOSIS — I429 Cardiomyopathy, unspecified: Secondary | ICD-10-CM | POA: Diagnosis not present

## 2014-09-09 LAB — BASIC METABOLIC PANEL WITH GFR
BUN: 16 mg/dL (ref 6–23)
CALCIUM: 9.4 mg/dL (ref 8.4–10.5)
CHLORIDE: 102 meq/L (ref 96–112)
CO2: 28 mEq/L (ref 19–32)
Creat: 1.36 mg/dL — ABNORMAL HIGH (ref 0.50–1.10)
GFR, EST AFRICAN AMERICAN: 47 mL/min — AB
GFR, EST NON AFRICAN AMERICAN: 41 mL/min — AB
Glucose, Bld: 75 mg/dL (ref 70–99)
Potassium: 4.2 mEq/L (ref 3.5–5.3)
Sodium: 140 mEq/L (ref 135–145)

## 2014-09-09 MED ORDER — CARVEDILOL 25 MG PO TABS: 25.0000 mg | ORAL_TABLET | Freq: Two times a day (BID) | ORAL | Status: DC

## 2014-09-09 NOTE — Assessment & Plan Note (Signed)
Patient has some orthopnea but denies dyspnea on exertion. She is not volume overloaded on examination. Continue present dose of Lasix and spironolactone. Check potassium, renal function and BNP. If BNP elevated will increase Lasix. We discussed fluid restriction and low sodium diet.

## 2014-09-09 NOTE — Assessment & Plan Note (Addendum)
Continue lisinopril. Increase Coreg to 25 mg twice a day. Repeat echocardiogram.

## 2014-09-09 NOTE — Assessment & Plan Note (Signed)
Continue present medications. 

## 2014-09-09 NOTE — Assessment & Plan Note (Signed)
Continue aspirin 

## 2014-09-09 NOTE — Assessment & Plan Note (Signed)
Followed by electrophysiology. 

## 2014-09-09 NOTE — Patient Instructions (Signed)
Your physician wants you to follow-up in: 6 MONTHS WITH DR Jens Som You will receive a reminder letter in the mail two months in advance. If you don't receive a letter, please call our office to schedule the follow-up appointment.   Your physician has requested that you have an echocardiogram. Echocardiography is a painless test that uses sound waves to create images of your heart. It provides your doctor with information about the size and shape of your heart and how well your heart's chambers and valves are working. This procedure takes approximately one hour. There are no restrictions for this procedure.   Your physician recommends that you HAVE LAB WORK TODAY  INCREASE CARVEDILOL 25 MG ONE TABLET TWICE DAILY= 2 OF THE 12.5 MG TABLETS TWICE DAILY

## 2014-09-10 LAB — BRAIN NATRIURETIC PEPTIDE: Brain Natriuretic Peptide: 83.8 pg/mL (ref 0.0–100.0)

## 2014-09-16 ENCOUNTER — Ambulatory Visit (HOSPITAL_COMMUNITY)
Admission: RE | Admit: 2014-09-16 | Discharge: 2014-09-16 | Disposition: A | Discharge status: Home / Self Care | Payer: Medicare PPO | Source: Ambulatory Visit | Attending: Cardiology | Admitting: Cardiology

## 2014-09-16 DIAGNOSIS — I351 Nonrheumatic aortic (valve) insufficiency: Secondary | ICD-10-CM | POA: Diagnosis not present

## 2014-09-16 DIAGNOSIS — I429 Cardiomyopathy, unspecified: Secondary | ICD-10-CM

## 2014-09-16 DIAGNOSIS — I517 Cardiomegaly: Secondary | ICD-10-CM | POA: Diagnosis not present

## 2014-09-16 DIAGNOSIS — I313 Pericardial effusion (noninflammatory): Secondary | ICD-10-CM | POA: Insufficient documentation

## 2014-09-16 DIAGNOSIS — I428 Other cardiomyopathies: Secondary | ICD-10-CM

## 2014-12-02 ENCOUNTER — Encounter: Payer: Medicare PPO | Admitting: *Deleted

## 2014-12-03 ENCOUNTER — Ambulatory Visit: Payer: Medicare PPO | Admitting: Internal Medicine

## 2014-12-06 ENCOUNTER — Encounter: Payer: Self-pay | Admitting: Cardiology

## 2014-12-14 ENCOUNTER — Encounter: Payer: Self-pay | Admitting: *Deleted

## 2015-01-04 ENCOUNTER — Telehealth: Payer: Self-pay | Admitting: Internal Medicine

## 2015-01-04 NOTE — Telephone Encounter (Signed)
Returned patient's call.  Patient was due for a remote Carelink transmission on 12/02/14, but states she was unable to get her monitor and [place] working.  Patient states that she called tech services but that she still was unable to get her monitor working.  She is requesting a device clinic appointment to have her device checked and to learn how to use her monitor.  Patient agreeable to appointment on Friday, October 28th at 2:00pm.  Patient denies any additional questions or concerns or at this time.

## 2015-01-04 NOTE — Telephone Encounter (Signed)
New Message  Device  Pt received a letter not due for check until 08/2015. Please assist

## 2015-01-07 ENCOUNTER — Encounter: Payer: Self-pay | Admitting: Internal Medicine

## 2015-01-07 ENCOUNTER — Ambulatory Visit (INDEPENDENT_AMBULATORY_CARE_PROVIDER_SITE_OTHER): Payer: Medicare PPO | Admitting: *Deleted

## 2015-01-07 DIAGNOSIS — I5022 Chronic systolic (congestive) heart failure: Secondary | ICD-10-CM

## 2015-01-07 DIAGNOSIS — Z95810 Presence of automatic (implantable) cardiac defibrillator: Secondary | ICD-10-CM

## 2015-01-07 DIAGNOSIS — I429 Cardiomyopathy, unspecified: Secondary | ICD-10-CM | POA: Diagnosis not present

## 2015-01-07 LAB — CUP PACEART INCLINIC DEVICE CHECK
Brady Statistic AP VS Percent: 0.07 %
Brady Statistic AS VP Percent: 63.02 %
Brady Statistic AS VS Percent: 0.16 %
Date Time Interrogation Session: 20161028151213
HIGH POWER IMPEDANCE MEASURED VALUE: 43 Ohm
HIGH POWER IMPEDANCE MEASURED VALUE: 51 Ohm
Implantable Lead Implant Date: 20121112
Implantable Lead Location: 753859
Implantable Lead Model: 5076
Lead Channel Impedance Value: 323 Ohm
Lead Channel Impedance Value: 418 Ohm
Lead Channel Impedance Value: 589 Ohm
Lead Channel Pacing Threshold Amplitude: 0.5 V
Lead Channel Pacing Threshold Amplitude: 0.75 V
Lead Channel Pacing Threshold Amplitude: 1.375 V
Lead Channel Pacing Threshold Pulse Width: 0.4 ms
Lead Channel Pacing Threshold Pulse Width: 0.6 ms
Lead Channel Sensing Intrinsic Amplitude: 5.375 mV
Lead Channel Sensing Intrinsic Amplitude: 8 mV
Lead Channel Setting Pacing Amplitude: 2 V
Lead Channel Setting Pacing Amplitude: 2.25 V
Lead Channel Setting Pacing Amplitude: 2.5 V
Lead Channel Setting Pacing Pulse Width: 0.6 ms
Lead Channel Setting Sensing Sensitivity: 0.3 mV
MDC IDC LEAD IMPLANT DT: 20121112
MDC IDC LEAD IMPLANT DT: 20121112
MDC IDC LEAD LOCATION: 753858
MDC IDC LEAD LOCATION: 753860
MDC IDC LEAD MODEL: 4194
MDC IDC MSMT BATTERY VOLTAGE: 2.82 V
MDC IDC MSMT LEADCHNL RA IMPEDANCE VALUE: 342 Ohm
MDC IDC MSMT LEADCHNL RA SENSING INTR AMPL: 2.875 mV
MDC IDC MSMT LEADCHNL RA SENSING INTR AMPL: 3.625 mV
MDC IDC MSMT LEADCHNL RV IMPEDANCE VALUE: 532 Ohm
MDC IDC MSMT LEADCHNL RV PACING THRESHOLD PULSEWIDTH: 0.4 ms
MDC IDC SET LEADCHNL RV PACING PULSEWIDTH: 0.4 ms
MDC IDC STAT BRADY AP VP PERCENT: 36.75 %
MDC IDC STAT BRADY RA PERCENT PACED: 36.82 %
MDC IDC STAT BRADY RV PERCENT PACED: 99.77 %

## 2015-01-07 NOTE — Progress Notes (Signed)
CRT-D device check in office. Thresholds and sensing consistent with previous device measurements. Lead impedance trends stable over time. No mode switch episodes recorded. No ventricular arrhythmia episodes recorded. Patient bi-ventricularly pacing 99.5% of the time. Device programmed with appropriate safety margins. Heart failure diagnostics reviewed---OptiVol abnormal 8/14--11/07/14. Audible alerts demonstrated for patient, pt knows to call clinic if heard. No changes made this session. Remaining power 2.82V. Carelink 04/01/15 & w/ GT 6/17.

## 2015-03-31 ENCOUNTER — Encounter: Payer: Self-pay | Admitting: Internal Medicine

## 2015-04-11 ENCOUNTER — Encounter: Payer: Medicare Other | Admitting: *Deleted

## 2015-04-11 ENCOUNTER — Telehealth: Payer: Self-pay | Admitting: Cardiology

## 2015-04-11 NOTE — Telephone Encounter (Signed)
Spoke with pt and reminded pt of remote transmission that is due today. Pt verbalized understanding.   

## 2015-04-13 ENCOUNTER — Encounter: Payer: Self-pay | Admitting: Cardiology

## 2015-04-13 ENCOUNTER — Encounter: Payer: Self-pay | Admitting: Internal Medicine

## 2015-04-13 ENCOUNTER — Ambulatory Visit (INDEPENDENT_AMBULATORY_CARE_PROVIDER_SITE_OTHER): Payer: Medicare Other | Admitting: Internal Medicine

## 2015-04-13 VITALS — BP 138/82 | HR 67 | Ht 65.0 in | Wt 141.0 lb

## 2015-04-13 DIAGNOSIS — I5022 Chronic systolic (congestive) heart failure: Secondary | ICD-10-CM

## 2015-04-13 DIAGNOSIS — I428 Other cardiomyopathies: Secondary | ICD-10-CM

## 2015-04-13 DIAGNOSIS — I429 Cardiomyopathy, unspecified: Secondary | ICD-10-CM | POA: Diagnosis not present

## 2015-04-13 DIAGNOSIS — T82897A Other specified complication of cardiac prosthetic devices, implants and grafts, initial encounter: Secondary | ICD-10-CM

## 2015-04-13 DIAGNOSIS — Z95810 Presence of automatic (implantable) cardiac defibrillator: Secondary | ICD-10-CM | POA: Diagnosis not present

## 2015-04-13 LAB — CUP PACEART INCLINIC DEVICE CHECK
Battery Voltage: 2.7 V
Brady Statistic RA Percent Paced: 32.26 %
HIGH POWER IMPEDANCE MEASURED VALUE: 46 Ohm
HIGH POWER IMPEDANCE MEASURED VALUE: 55 Ohm
Implantable Lead Implant Date: 20121112
Implantable Lead Location: 753859
Implantable Lead Model: 4194
Lead Channel Impedance Value: 418 Ohm
Lead Channel Impedance Value: 551 Ohm
Lead Channel Pacing Threshold Amplitude: 0.75 V
Lead Channel Pacing Threshold Amplitude: 1 V
Lead Channel Pacing Threshold Pulse Width: 0.4 ms
Lead Channel Pacing Threshold Pulse Width: 0.4 ms
Lead Channel Pacing Threshold Pulse Width: 0.6 ms
Lead Channel Setting Pacing Amplitude: 1.25 V
Lead Channel Setting Pacing Amplitude: 2 V
Lead Channel Setting Pacing Amplitude: 2 V
Lead Channel Setting Pacing Pulse Width: 1 ms
Lead Channel Setting Sensing Sensitivity: 0.3 mV
MDC IDC LEAD IMPLANT DT: 20121112
MDC IDC LEAD IMPLANT DT: 20121112
MDC IDC LEAD LOCATION: 753858
MDC IDC LEAD LOCATION: 753860
MDC IDC MSMT LEADCHNL LV IMPEDANCE VALUE: 323 Ohm
MDC IDC MSMT LEADCHNL LV IMPEDANCE VALUE: 475 Ohm
MDC IDC MSMT LEADCHNL LV IMPEDANCE VALUE: 646 Ohm
MDC IDC MSMT LEADCHNL RA PACING THRESHOLD AMPLITUDE: 0.5 V
MDC IDC MSMT LEADCHNL RA SENSING INTR AMPL: 3.875 mV
MDC IDC MSMT LEADCHNL RV SENSING INTR AMPL: 10.875 mV
MDC IDC SESS DTM: 20170201180533
MDC IDC SET LEADCHNL RV PACING PULSEWIDTH: 0.3 ms
MDC IDC STAT BRADY AP VP PERCENT: 32.22 %
MDC IDC STAT BRADY AP VS PERCENT: 0.04 %
MDC IDC STAT BRADY AS VP PERCENT: 66.83 %
MDC IDC STAT BRADY AS VS PERCENT: 0.91 %
MDC IDC STAT BRADY RV PERCENT PACED: 99.05 %

## 2015-04-13 NOTE — Patient Instructions (Signed)
Medication Instructions:  Your physician recommends that you continue on your current medications as directed. Please refer to the Current Medication list given to you today.   Labwork: None ordered   Testing/Procedures: None ordered   Follow-Up: Your physician wants you to follow-up in: 12 months with Dr Taylor You will receive a reminder letter in the mail two months in advance. If you don't receive a letter, please call our office to schedule the follow-up appointment.   Remote monitoring is used to monitor your  ICD from home. This monitoring reduces the number of office visits required to check your device to one time per year. It allows us to keep an eye on the functioning of your device to ensure it is working properly. You are scheduled for a device check from home on 07/13/15. You may send your transmission at any time that day. If you have a wireless device, the transmission will be sent automatically. After your physician reviews your transmission, you will receive a postcard with your next transmission date.    Any Other Special Instructions Will Be Listed Below (If Applicable).     If you need a refill on your cardiac medications before your next appointment, please call your pharmacy.   

## 2015-04-13 NOTE — Progress Notes (Signed)
HPI Mrs. Kitt returns today for followup. She is a pleasant 65 yo woman with a non-ischemic CM, chronic systolic heart failure, LBBB, s/p BiV ICD implant. In the interim, she has not been bothered by diaphragmatic stimulation. She has approximately 1 year of battery longevity. She denies any ICD shock and she has not been in the hospital with heart failure.  Allergies  Allergen Reactions  . Ivp Dye [Iodinated Diagnostic Agents] Other (See Comments)    Reaction: rash, itching and peel  . Penicillins Other (See Comments)    REACTION: rash, swelling  . Latex Rash    Allergic to latex gloves  . Strawberry Extract Other (See Comments)    Reaction: rash, swelling     Current Outpatient Prescriptions  Medication Sig Dispense Refill  . aspirin 81 MG EC tablet Take 81 mg by mouth daily.     . budesonide-formoterol (SYMBICORT) 80-4.5 MCG/ACT inhaler Inhale 2 puffs into the lungs 2 (two) times daily as needed.    . carvedilol (COREG) 25 MG tablet Take 1 tablet (25 mg total) by mouth 2 (two) times daily with a meal. 90 tablet 3  . Cholecalciferol (VITAMIN D3) 1000 UNITS CAPS Take 1,000 Units by mouth daily.     . fexofenadine (ALLEGRA) 180 MG tablet Take 180 mg by mouth daily.      . furosemide (LASIX) 40 MG tablet Take 40 mg by mouth daily.    Marland Kitchen levothyroxine (SYNTHROID, LEVOTHROID) 175 MCG tablet Take 1 tablet (175 mcg total) by mouth daily. 90 tablet 3  . lisinopril (PRINIVIL,ZESTRIL) 40 MG tablet Take 1 tablet (40 mg total) by mouth daily. 90 tablet 4  . spironolactone (ALDACTONE) 50 MG tablet Take 1 tablet (50 mg total) by mouth daily. 90 tablet 3  . tiotropium (SPIRIVA) 18 MCG inhalation capsule Place 1 capsule (18 mcg total) into inhaler and inhale every morning. 90 capsule 3  . triamcinolone (NASACORT) 55 MCG/ACT AERO nasal inhaler Place 1 spray into the nose daily.     No current facility-administered medications for this visit.     Past Medical History  Diagnosis Date  .  Hypertension   . Genital warts     hx  . Anemia, iron deficiency   . Blood transfusion complicating pregnancy     after first child at 4 yo  . DJD (degenerative joint disease)     hands  . Cervical disc disease     with recurrent radicular pain, Dr. Montez Morita  . COPD (chronic obstructive pulmonary disease) (HCC) 06/14/2010  . ALLERGIC RHINITIS 03/17/2008  . GOITER, MULTINODULAR 12/23/2009  . Cardiomyopathy     a. remote h/o cath with reported nonobs CAD; b. echo 5/12: EF 15%, mild LVH, mild MR, LAE, mod pericardial effusion with increased filling pressures  . Systolic CHF (HCC)   . Hypothyroid   . CAD (coronary artery disease)     Nonobstructive by cardiac catheter 8/12:EF 10-15%, circumflex calcified without obstructive CAD, proximal LAD 20%, mid LAD 20%, proximal RCA 20%, mid RCA 20%.  . Myocardial infarction (HCC)     ROS:   All systems reviewed and negative except as noted in the HPI.   Past Surgical History  Procedure Laterality Date  . Cholecystectomy    . Cardiac catheterization  1995    with "timy blockage" [facility] Dr. Daisy Floro  . Cardiac defibrillator placement  01-22-11    by Dr.Taylor  . Implantable cardioverter defibrillator implant N/A 01/22/2011    Procedure: IMPLANTABLE  CARDIOVERTER DEFIBRILLATOR IMPLANT;  Surgeon: Marinus Maw, MD;  Location: [facility] CATH LAB;  Service: Cardiovascular;  Laterality: N/A;     Family History  Problem Relation Age of Onset  . Stroke Mother   . Heart disease Mother   . Hypertension Mother   . Diabetes Mother   . Stroke Father   . Heart disease Father   . Hypertension Father   . Diabetes Father   . Cancer Sister     colon  . Heart disease Sister     pacemaker  . Hypertension Sister   . Depression Sister   . Diabetes Sister   . Thyroid disease Sister     uncertain type  . Transient ischemic attack Maternal Grandfather   . Alcohol abuse Other   . Malignant hyperthermia Other      Social History   Social History    . Marital Status: Married    Spouse Name: N/A  . Number of Children: 3  . Years of Education: N/A   Occupational History  .     Social History Main Topics  . Smoking status: Former Smoker -- 0.25 packs/day  . Smokeless tobacco: Not on file     Comment: very light, occasional stopped she states now  . Alcohol Use: No  . Drug Use: No  . Sexual Activity: No   Other Topics Concern  . Not on file   Social History Narrative     BP 138/82 mmHg  Pulse 67  Ht 5\' 5"  (1.651 m)  Wt 141 lb (63.957 kg)  BMI 23.46 kg/m2  Physical Exam:  Well appearing 65 yo woman, NAD HEENT: Unremarkable Neck:  7 cm JVD, no thyromegally Back:  No CVA tenderness Lungs:  Clear with no wheezes HEART:  Regular rate rhythm, no murmurs, no rubs, no clicks Abd:  soft, positive bowel sounds, no organomegally, no rebound, no guarding, diaghragmatic stimulation is present at baseline Ext:  2 plus pulses, no edema, no cyanosis, no clubbing Skin:  No rashes no nodules Neuro:  CN II through XII intact, motor grossly intact  EKG - NSR with BiV pacing  DEVICE  Normal device function.  See PaceArt for details.   Assess/Plan: 1. Diaphragmatic stimulation - her LV lead is the culprit. We have reprogrammed her device to prevent this. 2. ICD - her medtronic BiV ICD has been reprogrammed and is now working normally. Will recheck in several months.  3. Chronic systolic heart failure - her symptoms remain class 2. Will continue her current meds. 4. HTN - her blood pressure is slightly elevated. Will follow.  Leonia Reeves.D.

## 2015-07-13 ENCOUNTER — Encounter: Payer: Medicare Other | Admitting: *Deleted

## 2015-07-15 ENCOUNTER — Encounter: Payer: Self-pay | Admitting: Cardiology

## 2015-09-05 ENCOUNTER — Ambulatory Visit (INDEPENDENT_AMBULATORY_CARE_PROVIDER_SITE_OTHER): Payer: Medicare Other | Admitting: Internal Medicine

## 2015-09-05 ENCOUNTER — Encounter: Payer: Self-pay | Admitting: Internal Medicine

## 2015-09-05 VITALS — BP 132/78 | HR 76 | Ht 65.0 in | Wt 158.8 lb

## 2015-09-05 DIAGNOSIS — I429 Cardiomyopathy, unspecified: Secondary | ICD-10-CM

## 2015-09-05 NOTE — Patient Instructions (Addendum)
Medication Instructions:  Your physician recommends that you continue on your current medications as directed. Please refer to the Current Medication list given to you today.   Labwork: NONE  Testing/Procedures: NONE  Follow-Up: Your physician wants you to follow-up in: YEAR WITH   DR  Ladona Ridgel  AND  REMOTE  DEVICE  CHECK  10-04-15  You will receive a reminder letter in the mail two months in advance. If you don't receive a letter, please call our office to schedule the follow-up appointment.  Any Other Special Instructions Will Be Listed Below (If Applicable). PT  TO CALL  WHEN RECEIVES  AN  ALERT  RE  DEVICE  NEED  REPLACED    If you need a refill on your cardiac medications before your next appointment, please call your pharmacy.

## 2015-09-05 NOTE — Progress Notes (Signed)
HPI [name] returns today for followup. She is a pleasant 65 yo woman with a non-ischemic CM, chronic systolic heart failure, LBBB, s/p BiV ICD implant. In the interim, she has not been bothered by diaphragmatic stimulation. She has approximately 1 year of battery longevity. She denies any ICD shock and she has not been in the hospital with heart failure.  Allergies  Allergen Reactions  . Ivp Dye [Iodinated Diagnostic Agents] Other (See Comments)    Reaction: rash, itching and peel  . Penicillins Other (See Comments)    REACTION: rash, swelling  . Latex Rash    Allergic to latex gloves  . Strawberry Extract Other (See Comments)    Reaction: rash, swelling     Current Outpatient Prescriptions  Medication Sig Dispense Refill  . aspirin 81 MG EC tablet Take 81 mg by mouth daily.     . carvedilol (COREG) 25 MG tablet Take 1 tablet (25 mg total) by mouth 2 (two) times daily with a meal. 90 tablet 3  . Cholecalciferol (VITAMIN D3) 1000 UNITS CAPS Take 1,000 Units by mouth daily.     . fexofenadine (ALLEGRA) 180 MG tablet Take 180 mg by mouth daily.      . furosemide (LASIX) 40 MG tablet Take 40 mg by mouth daily.    Marland Kitchen levothyroxine (SYNTHROID, LEVOTHROID) 175 MCG tablet Take 1 tablet (175 mcg total) by mouth daily. 90 tablet 3  . lisinopril (PRINIVIL,ZESTRIL) 20 MG tablet Take 20 mg by mouth daily.    Marland Kitchen spironolactone (ALDACTONE) 50 MG tablet Take 1 tablet (50 mg total) by mouth daily. 90 tablet 3  . tiotropium (SPIRIVA) 18 MCG inhalation capsule Place 1 capsule (18 mcg total) into inhaler and inhale every morning. 90 capsule 3  . triamcinolone (NASACORT) 55 MCG/ACT AERO nasal inhaler Place 1 spray into the nose daily.     No current facility-administered medications for this visit.     Past Medical History  Diagnosis Date  . Hypertension   . Genital warts     hx  . Anemia, iron deficiency   . Blood transfusion complicating pregnancy     after first child at 65 yo  .  DJD (degenerative joint disease)     hands  . Cervical disc disease     with recurrent radicular pain, Dr. Montez Morita  . COPD (chronic obstructive pulmonary disease) (HCC) 06/14/2010  . ALLERGIC RHINITIS 03/17/2008  . GOITER, MULTINODULAR 12/23/2009  . Cardiomyopathy     a. remote h/o cath with reported nonobs CAD; b. echo 5/12: EF 15%, mild LVH, mild MR, LAE, mod pericardial effusion with increased filling pressures  . Systolic CHF (HCC)   . Hypothyroid   . CAD (coronary artery disease)     Nonobstructive by cardiac catheter 8/12:EF 10-15%, circumflex calcified without obstructive CAD, proximal LAD 20%, mid LAD 20%, proximal RCA 20%, mid RCA 20%.  . Myocardial infarction (HCC)     ROS:   All systems reviewed and negative except as noted in the HPI.   Past Surgical History  Procedure Laterality Date  . Cholecystectomy    . Cardiac catheterization  1995    with "timy blockage" [facility] Dr. Daisy Floro  . Cardiac defibrillator placement  01-22-11    by Dr.Lathon Adan  . Implantable cardioverter defibrillator implant N/A 01/22/2011    Procedure: IMPLANTABLE CARDIOVERTER DEFIBRILLATOR IMPLANT;  Surgeon: Marinus Maw, MD;  Location: [facility] CATH LAB;  Service: Cardiovascular;  Laterality: N/A;     Family  History  Problem Relation Age of Onset  . Stroke Mother   . Heart disease Mother   . Hypertension Mother   . Diabetes Mother   . Stroke Father   . Heart disease Father   . Hypertension Father   . Diabetes Father   . Cancer Sister     colon  . Heart disease Sister     pacemaker  . Hypertension Sister   . Depression Sister   . Diabetes Sister   . Thyroid disease Sister     uncertain type  . Transient ischemic attack Maternal Grandfather   . Alcohol abuse Other   . Malignant hyperthermia Other      Social History   Social History  . Marital Status: Married    Spouse Name: N/A  . Number of Children: 3  . Years of Education: N/A   Occupational History  .     Social History  Main Topics  . Smoking status: Former Smoker -- 0.25 packs/day  . Smokeless tobacco: Not on file     Comment: very light, occasional stopped she states now  . Alcohol Use: No  . Drug Use: No  . Sexual Activity: No   Other Topics Concern  . Not on file   Social History Narrative     BP 132/78 mmHg  Pulse 76  Ht 5\' 5"  (1.651 m)  Wt 158 lb 12.8 oz (72.031 kg)  BMI 26.43 kg/m2  Physical Exam:  Well appearing 65 yo woman, NAD HEENT: Unremarkable Neck:  7 cm JVD, no thyromegally Back:  No CVA tenderness Lungs:  Clear with no wheezes HEART:  Regular rate rhythm, no murmurs, no rubs, no clicks Abd:  soft, positive bowel sounds, no organomegally, no rebound, no guarding, diaghragmatic stimulation is present at baseline Ext:  2 plus pulses, no edema, no cyanosis, no clubbing Skin:  No rashes no nodules Neuro:  CN II through XII intact, motor grossly intact  EKG - NSR with BiV pacing  DEVICE  Normal device function.  See PaceArt for details.   Assess/Plan: 1. Diaphragmatic stimulation - her LV lead is the culprit. We have reprogrammed her device to prevent this. I would anticipate that we need to attempt to place a new lead when she reaches ERI. She would need a venogram prior. If her vein is occluded, then we would have to extract her old LV lead. 2. ICD - her medtronic BiV ICD has been reprogrammed and is now working normally. Will recheck in several months.  3. Chronic systolic heart failure - her symptoms remain class 2. Will continue her current meds. 4. HTN - her blood pressure is slightly elevated. Will follow. She notes that she is exercising daily.  Leonia Reeves.D.

## 2015-09-06 LAB — CUP PACEART INCLINIC DEVICE CHECK
Brady Statistic AP VP Percent: 16.2 %
Brady Statistic AP VS Percent: 0.02 %
Brady Statistic AS VP Percent: 82.97 %
Brady Statistic AS VS Percent: 0.8 %
HIGH POWER IMPEDANCE MEASURED VALUE: 58 Ohm
HighPow Impedance: 48 Ohm
Implantable Lead Implant Date: 20121112
Implantable Lead Implant Date: 20121112
Implantable Lead Location: 753859
Implantable Lead Model: 5076
Implantable Lead Model: 6947
Lead Channel Impedance Value: 380 Ohm
Lead Channel Impedance Value: 551 Ohm
Lead Channel Impedance Value: 551 Ohm
Lead Channel Impedance Value: 798 Ohm
Lead Channel Pacing Threshold Amplitude: 0.75 V
Lead Channel Pacing Threshold Pulse Width: 0.4 ms
Lead Channel Pacing Threshold Pulse Width: 0.4 ms
Lead Channel Sensing Intrinsic Amplitude: 13.375 mV
Lead Channel Sensing Intrinsic Amplitude: 4.5 mV
Lead Channel Setting Pacing Amplitude: 1.25 V
Lead Channel Setting Pacing Amplitude: 2 V
Lead Channel Setting Pacing Amplitude: 2 V
Lead Channel Setting Pacing Pulse Width: 0.3 ms
Lead Channel Setting Pacing Pulse Width: 1 ms
MDC IDC LEAD IMPLANT DT: 20121112
MDC IDC LEAD LOCATION: 753858
MDC IDC LEAD LOCATION: 753860
MDC IDC LEAD MODEL: 4194
MDC IDC MSMT BATTERY VOLTAGE: 2.64 V
MDC IDC MSMT LEADCHNL LV PACING THRESHOLD AMPLITUDE: 1 V
MDC IDC MSMT LEADCHNL LV PACING THRESHOLD PULSEWIDTH: 1 ms
MDC IDC MSMT LEADCHNL RA IMPEDANCE VALUE: 418 Ohm
MDC IDC MSMT LEADCHNL RA PACING THRESHOLD AMPLITUDE: 0.5 V
MDC IDC MSMT LEADCHNL RA SENSING INTR AMPL: 4.125 mV
MDC IDC MSMT LEADCHNL RV SENSING INTR AMPL: 12.25 mV
MDC IDC SESS DTM: 20170626165341
MDC IDC SET LEADCHNL RV SENSING SENSITIVITY: 0.3 mV
MDC IDC STAT BRADY RA PERCENT PACED: 16.22 %
MDC IDC STAT BRADY RV PERCENT PACED: 99.18 %

## 2015-09-25 ENCOUNTER — Other Ambulatory Visit: Payer: Self-pay | Admitting: Internal Medicine

## 2015-09-27 ENCOUNTER — Other Ambulatory Visit: Payer: Self-pay | Admitting: Internal Medicine

## 2015-09-27 NOTE — Telephone Encounter (Signed)
Patient has schedule cpe for 8/4.  Patient has been without thyroid medication for two days.  Is requesting refill to get her through until then.

## 2015-09-30 ENCOUNTER — Encounter: Payer: Medicare Other | Admitting: Internal Medicine

## 2015-10-04 ENCOUNTER — Telehealth: Payer: Self-pay | Admitting: Cardiology

## 2015-10-04 ENCOUNTER — Ambulatory Visit (INDEPENDENT_AMBULATORY_CARE_PROVIDER_SITE_OTHER): Payer: Medicare Other | Admitting: *Deleted

## 2015-10-04 DIAGNOSIS — Z95810 Presence of automatic (implantable) cardiac defibrillator: Secondary | ICD-10-CM

## 2015-10-04 NOTE — Telephone Encounter (Signed)
Spoke with pt and reminded pt of remote transmission that is due today. Pt verbalized understanding.   

## 2015-10-04 NOTE — Progress Notes (Signed)
Remote ICD transmission.   

## 2015-10-05 ENCOUNTER — Encounter: Payer: Self-pay | Admitting: Cardiology

## 2015-10-06 LAB — CUP PACEART REMOTE DEVICE CHECK
Battery Voltage: 2.63 V
Brady Statistic AP VP Percent: 9.54 %
Brady Statistic AS VP Percent: 89.61 %
Brady Statistic AS VS Percent: 0.84 %
Brady Statistic RA Percent Paced: 9.55 %
Date Time Interrogation Session: 20170725203050
HighPow Impedance: 51 Ohm
HighPow Impedance: 63 Ohm
Implantable Lead Implant Date: 20121112
Implantable Lead Implant Date: 20121112
Implantable Lead Model: 4194
Implantable Lead Model: 6947
Lead Channel Impedance Value: 475 Ohm
Lead Channel Impedance Value: 589 Ohm
Lead Channel Pacing Threshold Amplitude: 0.75 V
Lead Channel Pacing Threshold Pulse Width: 0.4 ms
Lead Channel Pacing Threshold Pulse Width: 0.4 ms
Lead Channel Sensing Intrinsic Amplitude: 3.5 mV
Lead Channel Sensing Intrinsic Amplitude: 9.125 mV
Lead Channel Setting Pacing Amplitude: 1.25 V
MDC IDC LEAD IMPLANT DT: 20121112
MDC IDC LEAD LOCATION: 753858
MDC IDC LEAD LOCATION: 753859
MDC IDC LEAD LOCATION: 753860
MDC IDC MSMT LEADCHNL LV IMPEDANCE VALUE: 380 Ohm
MDC IDC MSMT LEADCHNL LV IMPEDANCE VALUE: 779 Ohm
MDC IDC MSMT LEADCHNL LV PACING THRESHOLD AMPLITUDE: 0.875 V
MDC IDC MSMT LEADCHNL LV PACING THRESHOLD PULSEWIDTH: 1 ms
MDC IDC MSMT LEADCHNL RA PACING THRESHOLD AMPLITUDE: 0.5 V
MDC IDC MSMT LEADCHNL RA SENSING INTR AMPL: 3.5 mV
MDC IDC MSMT LEADCHNL RV IMPEDANCE VALUE: 684 Ohm
MDC IDC MSMT LEADCHNL RV SENSING INTR AMPL: 9.125 mV
MDC IDC SET LEADCHNL LV PACING PULSEWIDTH: 1 ms
MDC IDC SET LEADCHNL RA PACING AMPLITUDE: 2 V
MDC IDC SET LEADCHNL RV PACING AMPLITUDE: 2 V
MDC IDC SET LEADCHNL RV PACING PULSEWIDTH: 0.3 ms
MDC IDC SET LEADCHNL RV SENSING SENSITIVITY: 0.3 mV
MDC IDC STAT BRADY AP VS PERCENT: 0.01 %
MDC IDC STAT BRADY RV PERCENT PACED: 99.15 %

## 2015-10-14 ENCOUNTER — Encounter: Payer: Medicare Other | Admitting: Internal Medicine

## 2015-10-19 ENCOUNTER — Encounter: Payer: Self-pay | Admitting: Cardiology

## 2015-11-04 ENCOUNTER — Ambulatory Visit (INDEPENDENT_AMBULATORY_CARE_PROVIDER_SITE_OTHER): Payer: Medicare Other | Admitting: *Deleted

## 2015-11-04 DIAGNOSIS — Z95810 Presence of automatic (implantable) cardiac defibrillator: Secondary | ICD-10-CM

## 2015-11-04 DIAGNOSIS — I428 Other cardiomyopathies: Secondary | ICD-10-CM

## 2015-11-04 DIAGNOSIS — I429 Cardiomyopathy, unspecified: Secondary | ICD-10-CM

## 2015-11-04 NOTE — Progress Notes (Signed)
Remote ICD transmission.   

## 2015-11-10 ENCOUNTER — Encounter: Payer: Self-pay | Admitting: Cardiology

## 2015-11-17 LAB — CUP PACEART REMOTE DEVICE CHECK
Brady Statistic AP VP Percent: 22.93 %
Brady Statistic AS VP Percent: 75.97 %
Brady Statistic RA Percent Paced: 22.95 %
Brady Statistic RV Percent Paced: 98.89 %
HighPow Impedance: 44 Ohm
HighPow Impedance: 55 Ohm
Implantable Lead Implant Date: 20121112
Implantable Lead Implant Date: 20121112
Implantable Lead Location: 753858
Implantable Lead Location: 753860
Implantable Lead Model: 6947
Lead Channel Impedance Value: 380 Ohm
Lead Channel Impedance Value: 779 Ohm
Lead Channel Pacing Threshold Pulse Width: 0.4 ms
Lead Channel Pacing Threshold Pulse Width: 1 ms
Lead Channel Sensing Intrinsic Amplitude: 3.375 mV
Lead Channel Sensing Intrinsic Amplitude: 3.375 mV
Lead Channel Setting Sensing Sensitivity: 0.3 mV
MDC IDC LEAD IMPLANT DT: 20121112
MDC IDC LEAD LOCATION: 753859
MDC IDC LEAD MODEL: 4194
MDC IDC MSMT BATTERY VOLTAGE: 2.62 V
MDC IDC MSMT LEADCHNL LV IMPEDANCE VALUE: 380 Ohm
MDC IDC MSMT LEADCHNL LV IMPEDANCE VALUE: 532 Ohm
MDC IDC MSMT LEADCHNL LV PACING THRESHOLD AMPLITUDE: 1.125 V
MDC IDC MSMT LEADCHNL RA PACING THRESHOLD AMPLITUDE: 0.5 V
MDC IDC MSMT LEADCHNL RA PACING THRESHOLD PULSEWIDTH: 0.4 ms
MDC IDC MSMT LEADCHNL RV IMPEDANCE VALUE: 551 Ohm
MDC IDC MSMT LEADCHNL RV PACING THRESHOLD AMPLITUDE: 0.75 V
MDC IDC MSMT LEADCHNL RV SENSING INTR AMPL: 8.875 mV
MDC IDC MSMT LEADCHNL RV SENSING INTR AMPL: 8.875 mV
MDC IDC SESS DTM: 20170825141750
MDC IDC SET LEADCHNL LV PACING AMPLITUDE: 1.25 V
MDC IDC SET LEADCHNL LV PACING PULSEWIDTH: 1 ms
MDC IDC SET LEADCHNL RA PACING AMPLITUDE: 2 V
MDC IDC SET LEADCHNL RV PACING AMPLITUDE: 2 V
MDC IDC SET LEADCHNL RV PACING PULSEWIDTH: 0.3 ms
MDC IDC STAT BRADY AP VS PERCENT: 0.02 %
MDC IDC STAT BRADY AS VS PERCENT: 1.08 %

## 2015-11-23 ENCOUNTER — Encounter: Payer: Self-pay | Admitting: Internal Medicine

## 2015-11-23 ENCOUNTER — Ambulatory Visit (INDEPENDENT_AMBULATORY_CARE_PROVIDER_SITE_OTHER): Payer: Medicare Other | Admitting: Internal Medicine

## 2015-11-23 ENCOUNTER — Other Ambulatory Visit (INDEPENDENT_AMBULATORY_CARE_PROVIDER_SITE_OTHER): Payer: Medicare Other

## 2015-11-23 ENCOUNTER — Other Ambulatory Visit: Payer: Self-pay | Admitting: Internal Medicine

## 2015-11-23 VITALS — BP 140/80 | HR 67 | Temp 98.7°F | Resp 20 | Wt 165.0 lb

## 2015-11-23 DIAGNOSIS — E785 Hyperlipidemia, unspecified: Secondary | ICD-10-CM | POA: Insufficient documentation

## 2015-11-23 DIAGNOSIS — Z1159 Encounter for screening for other viral diseases: Secondary | ICD-10-CM

## 2015-11-23 DIAGNOSIS — E039 Hypothyroidism, unspecified: Secondary | ICD-10-CM

## 2015-11-23 DIAGNOSIS — R6889 Other general symptoms and signs: Secondary | ICD-10-CM

## 2015-11-23 DIAGNOSIS — I10 Essential (primary) hypertension: Secondary | ICD-10-CM

## 2015-11-23 DIAGNOSIS — Z0001 Encounter for general adult medical examination with abnormal findings: Secondary | ICD-10-CM

## 2015-11-23 DIAGNOSIS — Z23 Encounter for immunization: Secondary | ICD-10-CM | POA: Diagnosis not present

## 2015-11-23 DIAGNOSIS — M25511 Pain in right shoulder: Secondary | ICD-10-CM | POA: Diagnosis not present

## 2015-11-23 DIAGNOSIS — M25512 Pain in left shoulder: Secondary | ICD-10-CM

## 2015-11-23 DIAGNOSIS — M5412 Radiculopathy, cervical region: Secondary | ICD-10-CM | POA: Insufficient documentation

## 2015-11-23 DIAGNOSIS — J449 Chronic obstructive pulmonary disease, unspecified: Secondary | ICD-10-CM

## 2015-11-23 HISTORY — DX: Hyperlipidemia, unspecified: E78.5

## 2015-11-23 LAB — BASIC METABOLIC PANEL
BUN: 11 mg/dL (ref 6–23)
CHLORIDE: 103 meq/L (ref 96–112)
CO2: 30 mEq/L (ref 19–32)
Calcium: 9.1 mg/dL (ref 8.4–10.5)
Creatinine, Ser: 1.38 mg/dL — ABNORMAL HIGH (ref 0.40–1.20)
GFR: 49.03 mL/min — ABNORMAL LOW (ref >60.00)
GLUCOSE: 93 mg/dL (ref 70–99)
POTASSIUM: 3.9 meq/L (ref 3.5–5.1)
Sodium: 139 mEq/L (ref 135–145)

## 2015-11-23 LAB — URINALYSIS, ROUTINE W REFLEX MICROSCOPIC
Hgb urine dipstick: NEGATIVE
KETONES UR: NEGATIVE
LEUKOCYTES UA: NEGATIVE
NITRITE: NEGATIVE
PH: 6 (ref 5.0–8.0)
SPECIFIC GRAVITY, URINE: 1.025 (ref 1.000–1.030)
Urine Glucose: NEGATIVE
Urobilinogen, UA: 1 (ref 0.0–1.0)

## 2015-11-23 LAB — CBC WITH DIFFERENTIAL/PLATELET
Basophils Absolute: 0 10*3/uL (ref 0.0–0.1)
Basophils Relative: 0.7 % (ref 0.0–3.0)
EOS ABS: 0.1 10*3/uL (ref 0.0–0.7)
Eosinophils Relative: 2.4 % (ref 0.0–5.0)
HCT: 37.5 % (ref 36.0–46.0)
HEMOGLOBIN: 12.7 g/dL (ref 12.0–15.0)
Lymphocytes Relative: 34.6 % (ref 12.0–46.0)
Lymphs Abs: 1.3 10*3/uL (ref 0.7–4.0)
MCHC: 33.9 g/dL (ref 30.0–36.0)
MCV: 84.2 fl (ref 78.0–100.0)
MONO ABS: 0.2 10*3/uL (ref 0.1–1.0)
Monocytes Relative: 5.9 % (ref 3.0–12.0)
Neutro Abs: 2.2 10*3/uL (ref 1.4–7.7)
Neutrophils Relative %: 56.4 % (ref 43.0–77.0)
Platelets: 224 10*3/uL (ref 150.0–400.0)
RBC: 4.46 Mil/uL (ref 3.87–5.11)
RDW: 15.5 % (ref 11.5–15.5)
WBC: 3.8 10*3/uL — AB (ref 4.0–10.5)

## 2015-11-23 LAB — LIPID PANEL
CHOLESTEROL: 258 mg/dL — AB (ref 0–200)
HDL: 44.8 mg/dL (ref >39.00)
LDL Cholesterol: 190 mg/dL — ABNORMAL HIGH (ref 0–99)
NonHDL: 213.06
TRIGLYCERIDES: 114 mg/dL (ref 0.0–149.0)
Total CHOL/HDL Ratio: 6
VLDL: 22.8 mg/dL (ref 0.0–40.0)

## 2015-11-23 LAB — T4, FREE: FREE T4: 0 ng/dL — AB (ref 0.60–1.60)

## 2015-11-23 LAB — TSH: TSH: 45.68 u[IU]/mL — AB (ref 0.35–4.50)

## 2015-11-23 LAB — HEPATIC FUNCTION PANEL
ALBUMIN: 4.2 g/dL (ref 3.5–5.2)
ALT: 12 U/L (ref 0–35)
AST: 22 U/L (ref 0–37)
Alkaline Phosphatase: 107 U/L (ref 39–117)
Bilirubin, Direct: 0 mg/dL (ref 0.0–0.3)
Total Bilirubin: 0.4 mg/dL (ref 0.2–1.2)
Total Protein: 8.1 g/dL (ref 6.0–8.3)

## 2015-11-23 MED ORDER — TRAMADOL HCL 50 MG PO TABS: 50.0000 mg | ORAL_TABLET | Freq: Three times a day (TID) | ORAL | 1 refills | Status: DC | PRN

## 2015-11-23 MED ORDER — LEVOTHYROXINE SODIUM 200 MCG PO TABS: 200.0000 ug | ORAL_TABLET | Freq: Every day | ORAL | 3 refills | Status: DC

## 2015-11-23 MED ORDER — ROSUVASTATIN CALCIUM 40 MG PO TABS: 40.0000 mg | ORAL_TABLET | Freq: Every day | ORAL | 3 refills | Status: DC

## 2015-11-23 NOTE — Assessment & Plan Note (Signed)
stable overall by history and exam, recent data reviewed with pt, and pt to continue medical treatment as before,  to f/u any worsening symptoms or concerns BP Readings from Last 3 Encounters:  11/23/15 140/80  09/05/15 132/78  04/13/15 138/82

## 2015-11-23 NOTE — Progress Notes (Signed)
Subjective:    Patient ID: [name], female    DOB: 05-14-1950, 65 y.o.   MRN: [id number]  HPI  Here for wellness and f/u;  Overall doing ok;  Pt denies Chest pain, worsening SOB, DOE, wheezing, orthopnea, PND, worsening LE edema, palpitations, dizziness or syncope.  Pt denies neurological change such as new headache, facial or extremity weakness.  Pt denies polydipsia, polyuria, or low sugar symptoms. Pt states overall good compliance with treatment and medications, good tolerability, and has been trying to follow appropriate diet.  Pt denies worsening depressive symptoms, suicidal ideation or panic. No fever, night sweats, wt loss, loss of appetite, or other constitutional symptoms.  Pt states good ability with ADL's, has low fall risk, home safety reviewed and adequate, no other significant changes in hearing or vision, and only occasionally active with exercise. Wt has increased recently, just not active per pt. Declines colonoscopy s/p AICD as she believes she was told this per cardiology/Dr Thereasa Distance Readings from Last 3 Encounters:  11/23/15 165 lb (74.8 kg)  09/05/15 158 lb 12.8 oz (72 kg)  04/13/15 141 lb (64 kg)  Lisinopril has been reduced per pt per cardiology due to labile pressure at times too low.   BP Readings from Last 3 Encounters:  11/23/15 140/80  09/05/15 132/78  04/13/15 138/82   Also with left >> right neck pain with LUE pain and weakeness from left neck to third finger left hand, "like a shooting pain coming out of the end of the finger", mod to severe occasionally, somewhat better to hold the left arm somewhat elevated and close to the body with right hand, better with sling per Dr Montez Morita years ago but has not tried this yet; tried ice and heat to left anterior shoulder where pain seems the worst.  Has hx of "impgingement syndrome" but also has known left cervical radiculitis like pain per pt. Had issue more with right arm with pain to right elbow only and no  weakness.  Has to bend forward at the waist while sitting to comb hair as cannot raise either arm well enough.   Past Medical History:  Diagnosis Date  . ALLERGIC RHINITIS 03/17/2008  . Anemia, iron deficiency   . Blood transfusion complicating pregnancy    after first child at 24 yo  . CAD (coronary artery disease)    Nonobstructive by cardiac catheter 8/12:EF 10-15%, circumflex calcified without obstructive CAD, proximal LAD 20%, mid LAD 20%, proximal RCA 20%, mid RCA 20%.  . Cardiomyopathy    a. remote h/o cath with reported nonobs CAD; b. echo 5/12: EF 15%, mild LVH, mild MR, LAE, mod pericardial effusion with increased filling pressures  . Cervical disc disease    with recurrent radicular pain, Dr. Montez Morita  . COPD (chronic obstructive pulmonary disease) (HCC) 06/14/2010  . DJD (degenerative joint disease)    hands  . Genital warts    hx  . GOITER, MULTINODULAR 12/23/2009  . Hypertension   . Hypothyroid   . Myocardial infarction (HCC)   . Systolic CHF [facility])    Past Surgical History:  Procedure Laterality Date  . CARDIAC CATHETERIZATION  1995   with "timy blockage" [facility] Dr. Daisy Floro  . CARDIAC DEFIBRILLATOR PLACEMENT  01-22-11   by Dr.Taylor  . CHOLECYSTECTOMY    . IMPLANTABLE CARDIOVERTER DEFIBRILLATOR IMPLANT N/A 01/22/2011   Procedure: IMPLANTABLE CARDIOVERTER DEFIBRILLATOR IMPLANT;  Surgeon: Marinus Maw, MD;  Location: [facility] CATH LAB;  Service: Cardiovascular;  Laterality: N/A;  reports that she has quit smoking. She smoked 0.25 packs per day. She does not have any smokeless tobacco history on file. She reports that she does not drink alcohol or use drugs. family history includes Alcohol abuse in her other; Cancer in her sister; Depression in her sister; Diabetes in her father, mother, and sister; Heart disease in her father, mother, and sister; Hypertension in her father, mother, and sister; Malignant hyperthermia in her other; Stroke in her father and mother; Thyroid  disease in her sister; Transient ischemic attack in her maternal grandfather. Allergies  Allergen Reactions  . Ivp Dye [Iodinated Diagnostic Agents] Other (See Comments)    Reaction: rash, itching and peel  . Penicillins Other (See Comments)    REACTION: rash, swelling  . Latex Rash    Allergic to latex gloves  . Strawberry Extract Other (See Comments)    Reaction: rash, swelling   Current Outpatient Prescriptions on File Prior to Visit  Medication Sig Dispense Refill  . aspirin 81 MG EC tablet Take 81 mg by mouth daily.     . carvedilol (COREG) 25 MG tablet Take 1 tablet (25 mg total) by mouth 2 (two) times daily with a meal. 90 tablet 3  . Cholecalciferol (VITAMIN D3) 1000 UNITS CAPS Take 1,000 Units by mouth daily.     . fexofenadine (ALLEGRA) 180 MG tablet Take 180 mg by mouth daily.      . furosemide (LASIX) 40 MG tablet Take 40 mg by mouth daily.    Marland Kitchen levothyroxine (SYNTHROID, LEVOTHROID) 175 MCG tablet TAKE ONE TABLET BY MOUTH DAILY 90 tablet 0  . lisinopril (PRINIVIL,ZESTRIL) 20 MG tablet Take 20 mg by mouth daily.    Marland Kitchen spironolactone (ALDACTONE) 50 MG tablet Take 1 tablet (50 mg total) by mouth daily. 90 tablet 3  . tiotropium (SPIRIVA) 18 MCG inhalation capsule Place 1 capsule (18 mcg total) into inhaler and inhale every morning. 90 capsule 3  . triamcinolone (NASACORT) 55 MCG/ACT AERO nasal inhaler Place 1 spray into the nose daily.     No current facility-administered medications on file prior to visit.     Review of Systems Constitutional: Negative for increased diaphoresis, or other activity, appetite or siginficant weight change other than noted HENT: Negative for worsening hearing loss, ear pain, facial swelling, mouth sores and neck stiffness.   Eyes: Negative for other worsening pain, redness or visual disturbance.  Respiratory: Negative for choking or stridor Cardiovascular: Negative for other chest pain and palpitations.  Gastrointestinal: Negative for worsening  diarrhea, blood in stool, or abdominal distention Genitourinary: Negative for hematuria, flank pain or change in urine volume.  Musculoskeletal: Negative for myalgias or other joint complaints.  Skin: Negative for other color change and wound or drainage.  Neurological: Negative for syncope and numbness. other than noted Hematological: Negative for adenopathy. or other swelling Psychiatric/Behavioral: Negative for hallucinations, SI, self-injury, decreased concentration or other worsening agitation.      Objective:   Physical Exam BP 140/80   Pulse 67   Temp 98.7 F (37.1 C) (Oral)   Resp 20   Wt 165 lb (74.8 kg)   SpO2 96%   BMI 27.46 kg/m  VS noted,  Constitutional: Pt is oriented to person, place, and time. Appears well-developed and well-nourished, in no significant distress Head: Normocephalic and atraumatic  Eyes: Conjunctivae and EOM are normal. Pupils are equal, round, and reactive to light Right Ear: External ear normal.  Left Ear: External ear normal Nose: Nose normal.  Right shoulder  NT, no swelling but with marked pain and reduced ROM to abduction and forward elevation to 50 degrees only Left shoulder with marked anterior tender and subacromial tender with some swelling, and severe tender to attempted abduction and forward elevation greater than 20 degrees Mouth/Throat: Oropharynx is clear and moist  Neck: Normal range of motion. Neck supple. No JVD present. No tracheal deviation present or significant neck LA or mass Cardiovascular: Normal rate, regular rhythm, normal heart sounds and intact distal pulses.   Pulmonary/Chest: Effort normal and breath sounds without rales or wheezing  Abdominal: Soft. Bowel sounds are normal. NT. No HSM  Musculoskeletal: otherwise Normal range of motion. Exhibits no edema; except for cervical spine with marked midline tender about c3 or c4 and bilat trapezoid tender left > right Lymphadenopathy: Has no cervical adenopathy.    Neurological: Pt is alert and oriented to person, place, and time. Pt has normal reflexes. No cranial nerve deficit. Motor 4/5 LUE with reduced grip as well o/w intact Skin: Skin is warm and dry. No rash noted or new ulcers Psychiatric:  Has normal mood and affect. Behavior is normal.         Assessment & Plan:

## 2015-11-23 NOTE — Assessment & Plan Note (Addendum)
Though has left shoulder pain/impingment likely, also seems likely to have this as well, cannot do MRI due to defibrillator in place, to refer ortho for this as well, may need CT instead  In addition to the time spent performing CPE, I spent an additional 25 minutes face to face,in which greater than 50% of this time was spent in counseling and coordination of care for patient's acute illness as documented.

## 2015-11-23 NOTE — Patient Instructions (Addendum)
You had the flu shot today, and the Pneumovax today  Please take all new medication as prescribed - the pain medication  Please continue all other medications as before, and refills have been done if requested.  Please have the pharmacy call with any other refills you may need.  Please continue your efforts at being more active, low cholesterol diet, and weight control.  You are otherwise up to date with prevention measures today.  Please keep your appointments with your specialists as you may have planned  You will be contacted regarding the referral for: orthopedic  Please go to the LAB in the Basement (turn left off the elevator) for the tests to be done today  You will be contacted by phone if any changes need to be made immediately.  Otherwise, you will receive a letter about your results with an explanation, but please check with MyChart first.  Please remember to sign up for MyChart if you have not done so, as this will be important to you in the future with finding out test results, communicating by private email, and scheduling acute appointments online when needed.  Please return in 6 months, or sooner if needed

## 2015-11-23 NOTE — Progress Notes (Signed)
Pre visit review using our clinic review tool, if applicable. No additional management support is needed unless otherwise documented below in the visit note. 

## 2015-11-23 NOTE — Assessment & Plan Note (Addendum)
For f/u lab today, o/w stable overall by history and exam, recent data reviewed with pt, and pt to continue medical treatment as before,  to f/u any worsening symptoms or concerns Lab Results  Component Value Date   TSH 21.66 (H) 12/12/2011

## 2015-11-23 NOTE — Addendum Note (Signed)
Addended by: Etheleen Mayhew C on: 11/23/2015 10:57 AM   Modules accepted: Orders

## 2015-11-23 NOTE — Assessment & Plan Note (Signed)
stable overall by history and exam, recent data reviewed with pt, and pt to continue medical treatment as before,  to f/u any worsening symptoms or concerns @LASTSAO2(3)@  

## 2015-11-23 NOTE — Assessment & Plan Note (Signed)
Left > right probable recurrent impingement syndromes, for ortho referral

## 2015-11-23 NOTE — Assessment & Plan Note (Signed)

## 2015-11-24 LAB — HEPATITIS C ANTIBODY: HCV Ab: NEGATIVE

## 2015-11-25 ENCOUNTER — Encounter: Payer: Self-pay | Admitting: Cardiology

## 2015-12-12 ENCOUNTER — Ambulatory Visit (INDEPENDENT_AMBULATORY_CARE_PROVIDER_SITE_OTHER): Payer: Medicare Other | Admitting: *Deleted

## 2015-12-12 ENCOUNTER — Telehealth: Payer: Self-pay | Admitting: Cardiology

## 2015-12-12 DIAGNOSIS — I428 Other cardiomyopathies: Secondary | ICD-10-CM

## 2015-12-12 DIAGNOSIS — Z95810 Presence of automatic (implantable) cardiac defibrillator: Secondary | ICD-10-CM

## 2015-12-12 NOTE — Telephone Encounter (Signed)
LMOVM reminding pt to send remote transmission.   

## 2015-12-12 NOTE — Progress Notes (Signed)
Remote ICD transmission.   

## 2015-12-13 LAB — CUP PACEART REMOTE DEVICE CHECK
Brady Statistic AP VP Percent: 46.32 %
Brady Statistic AP VS Percent: 0.05 %
Brady Statistic AS VP Percent: 53.11 %
Brady Statistic AS VS Percent: 0.53 %
Date Time Interrogation Session: 20171002232210
HIGH POWER IMPEDANCE MEASURED VALUE: 46 Ohm
HighPow Impedance: 57 Ohm
Implantable Lead Implant Date: 20121112
Implantable Lead Implant Date: 20121112
Implantable Lead Location: 753858
Implantable Lead Location: 753860
Implantable Lead Model: 5076
Lead Channel Impedance Value: 418 Ohm
Lead Channel Impedance Value: 551 Ohm
Lead Channel Impedance Value: 589 Ohm
Lead Channel Impedance Value: 836 Ohm
Lead Channel Pacing Threshold Amplitude: 1.125 V
Lead Channel Pacing Threshold Pulse Width: 0.4 ms
Lead Channel Pacing Threshold Pulse Width: 1 ms
Lead Channel Sensing Intrinsic Amplitude: 10.375 mV
Lead Channel Sensing Intrinsic Amplitude: 3.25 mV
Lead Channel Sensing Intrinsic Amplitude: 3.25 mV
Lead Channel Setting Pacing Amplitude: 1.25 V
Lead Channel Setting Pacing Amplitude: 2 V
Lead Channel Setting Pacing Amplitude: 2 V
Lead Channel Setting Pacing Pulse Width: 0.3 ms
MDC IDC LEAD IMPLANT DT: 20121112
MDC IDC LEAD LOCATION: 753859
MDC IDC LEAD MODEL: 4194
MDC IDC MSMT BATTERY VOLTAGE: 2.62 V
MDC IDC MSMT LEADCHNL RA IMPEDANCE VALUE: 418 Ohm
MDC IDC MSMT LEADCHNL RA PACING THRESHOLD AMPLITUDE: 0.5 V
MDC IDC MSMT LEADCHNL RV PACING THRESHOLD AMPLITUDE: 0.75 V
MDC IDC MSMT LEADCHNL RV PACING THRESHOLD PULSEWIDTH: 0.4 ms
MDC IDC MSMT LEADCHNL RV SENSING INTR AMPL: 10.375 mV
MDC IDC SET LEADCHNL LV PACING PULSEWIDTH: 1 ms
MDC IDC SET LEADCHNL RV SENSING SENSITIVITY: 0.3 mV
MDC IDC STAT BRADY RA PERCENT PACED: 46.37 %
MDC IDC STAT BRADY RV PERCENT PACED: 99.43 %

## 2015-12-14 ENCOUNTER — Encounter: Payer: Self-pay | Admitting: Cardiology

## 2015-12-16 ENCOUNTER — Ambulatory Visit (INDEPENDENT_AMBULATORY_CARE_PROVIDER_SITE_OTHER): Payer: Medicare Other | Admitting: Internal Medicine

## 2015-12-16 ENCOUNTER — Encounter: Payer: Self-pay | Admitting: Internal Medicine

## 2015-12-16 VITALS — BP 124/84 | HR 60 | Ht 65.0 in | Wt 166.0 lb

## 2015-12-16 DIAGNOSIS — Z95810 Presence of automatic (implantable) cardiac defibrillator: Secondary | ICD-10-CM | POA: Diagnosis not present

## 2015-12-16 DIAGNOSIS — Z45010 Encounter for checking and testing of cardiac pacemaker pulse generator [battery]: Secondary | ICD-10-CM

## 2015-12-16 DIAGNOSIS — I5022 Chronic systolic (congestive) heart failure: Secondary | ICD-10-CM

## 2015-12-16 NOTE — Patient Instructions (Addendum)
Medication Instructions:  Your physician recommends that you continue on your current medications as directed. Please refer to the Current Medication list given to you today.   Labwork: BMET/CBC (day of echo) please make appt 01/10/16  Testing/Procedures: Your physician has requested that you have an echocardiogram. Echocardiography is a painless test that uses sound waves to create images of your heart. It provides your doctor with information about the size and shape of your heart and how well your heart's chambers and valves are working. This procedure takes approximately one hour. There are no restrictions for this procedure.---please make appt on 01/10/16  ICD generator change with possible left ventricular lead revision   Follow-Up: Follow-up for a wound check in the Device [facility] 10-14 days from 01/24/16 and with Dr. Ladona Ridgel 91 days after 01/24/16.    Any Other Special Instructions Will Be Listed Below ---  Please wash with the CHG Soap the night before and morning of procedure (follow instruction page "Preparing For Surgery").   Please report to the [facility] 01/24/16 at 5:30 AM  Nothing to eat or drink after midnight the night before procedure  No medication day of procedure  You will need someone to drive you home after the procedure OR 1 night stay    If you need a refill on your cardiac medications before your next appointment, please call your pharmacy.

## 2015-12-16 NOTE — Progress Notes (Signed)
HPI [name] returns today for followup. She is a pleasant 65 yo woman with a non-ischemic CM, chronic systolic heart failure, LBBB, s/p BiV ICD implant. In the interim, she has not been bothered by diaphragmatic stimulation. Her device has reached ERI. She denies any ICD shock and she has not been in the hospital with heart failure. Her last echo was over a year ago. Allergies  Allergen Reactions  . Ivp Dye [Iodinated Diagnostic Agents] Other (See Comments)    Reaction: rash, itching and peel  . Penicillins Other (See Comments)    REACTION: rash, swelling  . Latex Rash    Allergic to latex gloves  . Strawberry Extract Other (See Comments)    Reaction: rash, swelling     Current Outpatient Prescriptions  Medication Sig Dispense Refill  . aspirin 81 MG EC tablet Take 81 mg by mouth daily.     . carvedilol (COREG) 25 MG tablet Take 1 tablet (25 mg total) by mouth 2 (two) times daily with a meal. 90 tablet 3  . Cholecalciferol (VITAMIN D3) 1000 UNITS CAPS Take 1,000 Units by mouth daily.     . fexofenadine (ALLEGRA) 180 MG tablet Take 180 mg by mouth daily.      . furosemide (LASIX) 40 MG tablet Take 40 mg by mouth daily.    Marland Kitchen levothyroxine (SYNTHROID) 200 MCG tablet Take 1 tablet (200 mcg total) by mouth daily before breakfast. 90 tablet 3  . lisinopril (PRINIVIL,ZESTRIL) 20 MG tablet Take 20 mg by mouth daily.    . rosuvastatin (CRESTOR) 40 MG tablet Take 1 tablet (40 mg total) by mouth daily. 90 tablet 3  . spironolactone (ALDACTONE) 50 MG tablet Take 1 tablet (50 mg total) by mouth daily. 90 tablet 3  . tiotropium (SPIRIVA) 18 MCG inhalation capsule Place 1 capsule (18 mcg total) into inhaler and inhale every morning. 90 capsule 3  . traMADol (ULTRAM) 50 MG tablet Take 50 mg by mouth every 8 (eight) hours as needed (pain).    . triamcinolone (NASACORT) 55 MCG/ACT AERO nasal inhaler Place 1 spray into the nose daily.     No current facility-administered medications for this  visit.      Past Medical History:  Diagnosis Date  . ALLERGIC RHINITIS 03/17/2008  . Anemia, iron deficiency   . Blood transfusion complicating pregnancy    after first child at 48 yo  . CAD (coronary artery disease)    Nonobstructive by cardiac catheter 8/12:EF 10-15%, circumflex calcified without obstructive CAD, proximal LAD 20%, mid LAD 20%, proximal RCA 20%, mid RCA 20%.  . Cardiomyopathy    a. remote h/o cath with reported nonobs CAD; b. echo 5/12: EF 15%, mild LVH, mild MR, LAE, mod pericardial effusion with increased filling pressures  . Cervical disc disease    with recurrent radicular pain, Dr. Montez Morita  . COPD (chronic obstructive pulmonary disease) (HCC) 06/14/2010  . DJD (degenerative joint disease)    hands  . Genital warts    hx  . GOITER, MULTINODULAR 12/23/2009  . Hyperlipidemia 11/23/2015  . Hypertension   . Hypothyroid   . Myocardial infarction   . Systolic CHF (HCC)     ROS:   All systems reviewed and negative except as noted in the HPI.   Past Surgical History:  Procedure Laterality Date  . CARDIAC CATHETERIZATION  1995   with "timy blockage" [facility] Dr. Daisy Floro  . CARDIAC DEFIBRILLATOR PLACEMENT  01-22-11   by Dr.Taylor  .  CHOLECYSTECTOMY    . IMPLANTABLE CARDIOVERTER DEFIBRILLATOR IMPLANT N/A 01/22/2011   Procedure: IMPLANTABLE CARDIOVERTER DEFIBRILLATOR IMPLANT;  Surgeon: Marinus MawGregg W Taylor, MD;  [facility];  Service: Cardiovascular;  Laterality: N/A;     Family History  Problem Relation Age of Onset  . Stroke Mother   . Heart disease Mother   . Hypertension Mother   . Diabetes Mother   . Stroke Father   . Heart disease Father   . Hypertension Father   . Diabetes Father   . Cancer Sister     colon  . Heart disease Sister     pacemaker  . Hypertension Sister   . Depression Sister   . Diabetes Sister   . Thyroid disease Sister     uncertain type  . Transient ischemic attack Maternal Grandfather   . Alcohol abuse Other   .  Malignant hyperthermia Other      Social History   Social History  . Marital status: Married    Spouse name: N/A  . Number of children: 3  . Years of education: N/A   Occupational History  .  [employer]   Social History Main Topics  . Smoking status: Former Smoker    Packs/day: 0.25  . Smokeless tobacco: Not on file     Comment: very light, occasional stopped she states now  . Alcohol use No  . Drug use: No  . Sexual activity: No   Other Topics Concern  . Not on file   Social History Narrative  . No narrative on file     BP 124/84   Pulse 60   Ht 5\' 5"  (1.651 m)   Wt 166 lb (75.3 kg)   BMI 27.62 kg/m   Physical Exam:  Well appearing 65 yo woman, NAD HEENT: Unremarkable Neck:  7 cm JVD, no thyromegally Back:  No CVA tenderness Lungs:  Clear with no wheezes HEART:  Regular rate rhythm, no murmurs, no rubs, no clicks Abd:  soft, positive bowel sounds, no organomegally, no rebound, no guarding, diaghragmatic stimulation is present at baseline Ext:  2 plus pulses, no edema, no cyanosis, no clubbing Skin:  No rashes no nodules Neuro:  CN II through XII intact, motor grossly intact  DEVICE  Normal device function.  See PaceArt for details.   Assess/Plan: 1. Diaphragmatic stimulation - her LV lead has been reprogrammed and no stimulation. Her threshold tip to coil is 0.5 and her output has been left at 1.0. 2. ICD - her medtronic BiV ICD has been reprogrammed and is now working normally. Will recheck in several months.  3. Chronic systolic heart failure - her symptoms remain class 2. Will continue her current meds. 4. HTN - her blood pressure is well controlled. She is encouraged to maintain a low sodium diet.  Leonia ReevesGregg Taylor,M.D.

## 2015-12-28 ENCOUNTER — Encounter: Payer: Self-pay | Admitting: Cardiology

## 2016-01-10 ENCOUNTER — Other Ambulatory Visit: Payer: Self-pay

## 2016-01-10 ENCOUNTER — Ambulatory Visit (HOSPITAL_COMMUNITY): Payer: Medicare Other | Attending: Cardiology

## 2016-01-10 ENCOUNTER — Other Ambulatory Visit: Payer: Medicare Other | Admitting: *Deleted

## 2016-01-10 DIAGNOSIS — Z45010 Encounter for checking and testing of cardiac pacemaker pulse generator [battery]: Secondary | ICD-10-CM | POA: Diagnosis not present

## 2016-01-10 DIAGNOSIS — I5022 Chronic systolic (congestive) heart failure: Secondary | ICD-10-CM

## 2016-01-10 LAB — CBC WITH DIFFERENTIAL/PLATELET

## 2016-01-10 LAB — BASIC METABOLIC PANEL
BUN: 17 mg/dL (ref 7–25)
CALCIUM: 9 mg/dL (ref 8.6–10.4)
CO2: 26 mmol/L (ref 20–31)
CREATININE: 1.57 mg/dL — AB (ref 0.50–0.99)
Chloride: 105 mmol/L (ref 98–110)
GLUCOSE: 108 mg/dL — AB (ref 65–99)
Potassium: 3.8 mmol/L (ref 3.5–5.3)
Sodium: 140 mmol/L (ref 135–146)

## 2016-01-12 ENCOUNTER — Other Ambulatory Visit: Payer: Medicare Other | Admitting: *Deleted

## 2016-01-12 DIAGNOSIS — D500 Iron deficiency anemia secondary to blood loss (chronic): Secondary | ICD-10-CM | POA: Diagnosis not present

## 2016-01-12 DIAGNOSIS — I2510 Atherosclerotic heart disease of native coronary artery without angina pectoris: Secondary | ICD-10-CM | POA: Diagnosis not present

## 2016-01-12 DIAGNOSIS — I10 Essential (primary) hypertension: Secondary | ICD-10-CM | POA: Diagnosis not present

## 2016-01-12 LAB — CBC WITH DIFFERENTIAL/PLATELET
Basophils Absolute: 42 cells/uL (ref 0–200)
Basophils Relative: 1 %
Eosinophils Absolute: 126 cells/uL (ref 15–500)
Eosinophils Relative: 3 %
HCT: 36.6 % (ref 35.0–45.0)
Hemoglobin: 11.9 g/dL (ref 11.7–15.5)
Lymphocytes Relative: 44 %
Lymphs Abs: 1848 cells/uL (ref 850–3900)
MCH: 28.3 pg (ref 27.0–33.0)
MCHC: 32.5 g/dL (ref 32.0–36.0)
MCV: 86.9 fL (ref 80.0–100.0)
MPV: 11.8 fL (ref 7.5–12.5)
Monocytes Absolute: 252 cells/uL (ref 200–950)
Monocytes Relative: 6 %
Neutro Abs: 1932 cells/uL (ref 1500–7800)
Neutrophils Relative %: 46 %
Platelets: 196 10*3/uL (ref 140–400)
RBC: 4.21 MIL/uL (ref 3.80–5.10)
RDW: 15.4 % — ABNORMAL HIGH (ref 11.0–15.0)
WBC: 4.2 10*3/uL (ref 3.8–10.8)

## 2016-01-24 ENCOUNTER — Ambulatory Visit (HOSPITAL_COMMUNITY)
Admission: RE | Admit: 2016-01-24 | Discharge: 2016-01-24 | Disposition: A | Discharge status: Home / Self Care | Payer: Medicare Other | Source: Ambulatory Visit | Attending: Internal Medicine | Admitting: Internal Medicine

## 2016-01-24 ENCOUNTER — Encounter (HOSPITAL_COMMUNITY)
Admission: RE | Disposition: A | Discharge status: Home / Self Care | Payer: Self-pay | Source: Ambulatory Visit | Attending: Internal Medicine

## 2016-01-24 ENCOUNTER — Encounter (HOSPITAL_COMMUNITY): Payer: Self-pay | Admitting: *Deleted

## 2016-01-24 DIAGNOSIS — I5022 Chronic systolic (congestive) heart failure: Secondary | ICD-10-CM | POA: Insufficient documentation

## 2016-01-24 DIAGNOSIS — Z4502 Encounter for adjustment and management of automatic implantable cardiac defibrillator: Secondary | ICD-10-CM | POA: Diagnosis not present

## 2016-01-24 DIAGNOSIS — E785 Hyperlipidemia, unspecified: Secondary | ICD-10-CM | POA: Diagnosis not present

## 2016-01-24 DIAGNOSIS — Z8249 Family history of ischemic heart disease and other diseases of the circulatory system: Secondary | ICD-10-CM | POA: Diagnosis not present

## 2016-01-24 DIAGNOSIS — I110 Hypertensive heart disease with heart failure: Secondary | ICD-10-CM | POA: Diagnosis not present

## 2016-01-24 DIAGNOSIS — Z87891 Personal history of nicotine dependence: Secondary | ICD-10-CM | POA: Diagnosis not present

## 2016-01-24 DIAGNOSIS — Z79899 Other long term (current) drug therapy: Secondary | ICD-10-CM | POA: Diagnosis not present

## 2016-01-24 DIAGNOSIS — Z006 Encounter for examination for normal comparison and control in clinical research program: Secondary | ICD-10-CM | POA: Diagnosis not present

## 2016-01-24 DIAGNOSIS — I219 Acute myocardial infarction, unspecified: Secondary | ICD-10-CM | POA: Diagnosis present

## 2016-01-24 DIAGNOSIS — I429 Cardiomyopathy, unspecified: Secondary | ICD-10-CM | POA: Diagnosis not present

## 2016-01-24 DIAGNOSIS — I447 Left bundle-branch block, unspecified: Secondary | ICD-10-CM | POA: Insufficient documentation

## 2016-01-24 DIAGNOSIS — E039 Hypothyroidism, unspecified: Secondary | ICD-10-CM | POA: Insufficient documentation

## 2016-01-24 DIAGNOSIS — I2510 Atherosclerotic heart disease of native coronary artery without angina pectoris: Secondary | ICD-10-CM | POA: Diagnosis not present

## 2016-01-24 DIAGNOSIS — I252 Old myocardial infarction: Secondary | ICD-10-CM | POA: Insufficient documentation

## 2016-01-24 DIAGNOSIS — Z7982 Long term (current) use of aspirin: Secondary | ICD-10-CM | POA: Insufficient documentation

## 2016-01-24 DIAGNOSIS — I428 Other cardiomyopathies: Secondary | ICD-10-CM | POA: Diagnosis not present

## 2016-01-24 DIAGNOSIS — J449 Chronic obstructive pulmonary disease, unspecified: Secondary | ICD-10-CM | POA: Diagnosis not present

## 2016-01-24 HISTORY — PX: EP IMPLANTABLE DEVICE: SHX172B

## 2016-01-24 LAB — SURGICAL PCR SCREEN
MRSA, PCR: NEGATIVE
Staphylococcus aureus: NEGATIVE

## 2016-01-24 SURGERY — ICD/BIV ICD GENERATOR CHANGEOUT
Anesthesia: LOCAL

## 2016-01-24 MED ORDER — MIDAZOLAM HCL 5 MG/5ML IJ SOLN
INTRAMUSCULAR | Status: AC
  Filled 2016-01-24: qty 5

## 2016-01-24 MED ORDER — FAMOTIDINE IN NACL 20-0.9 MG/50ML-% IV SOLN
INTRAVENOUS | Status: AC
  Filled 2016-01-24: qty 50

## 2016-01-24 MED ORDER — VANCOMYCIN HCL IN DEXTROSE 1-5 GM/200ML-% IV SOLN
1000.0000 mg | INTRAVENOUS | Status: AC
  Administered 2016-01-24: 1000 mg via INTRAVENOUS

## 2016-01-24 MED ORDER — DIPHENHYDRAMINE HCL 50 MG/ML IJ SOLN
INTRAMUSCULAR | Status: AC
  Filled 2016-01-24: qty 1

## 2016-01-24 MED ORDER — SODIUM CHLORIDE 0.9 % IR SOLN
Status: AC
  Filled 2016-01-24: qty 2

## 2016-01-24 MED ORDER — FENTANYL CITRATE (PF) 100 MCG/2ML IJ SOLN
INTRAMUSCULAR | Status: AC
  Filled 2016-01-24: qty 2

## 2016-01-24 MED ORDER — VANCOMYCIN HCL IN DEXTROSE 1-5 GM/200ML-% IV SOLN
INTRAVENOUS | Status: AC
  Filled 2016-01-24: qty 200

## 2016-01-24 MED ORDER — HEPARIN (PORCINE) IN NACL 2-0.9 UNIT/ML-% IJ SOLN
INTRAMUSCULAR | Status: AC
Start: 2016-01-24 — End: 2016-01-24
  Filled 2016-01-24: qty 500

## 2016-01-24 MED ORDER — CHLORHEXIDINE GLUCONATE 4 % EX LIQD: 60.0000 mL | Freq: Once | CUTANEOUS | Status: DC

## 2016-01-24 MED ORDER — ONDANSETRON HCL 4 MG/2ML IJ SOLN: 4.0000 mg | Freq: Four times a day (QID) | INTRAMUSCULAR | Status: DC | PRN

## 2016-01-24 MED ORDER — METHYLPREDNISOLONE SODIUM SUCC 125 MG IJ SOLR
INTRAMUSCULAR | Status: AC
  Filled 2016-01-24: qty 2

## 2016-01-24 MED ORDER — LIDOCAINE HCL (PF) 1 % IJ SOLN
INTRAMUSCULAR | Status: DC | PRN
  Administered 2016-01-24: 35 mL

## 2016-01-24 MED ORDER — MIDAZOLAM HCL 5 MG/5ML IJ SOLN
INTRAMUSCULAR | Status: DC | PRN
  Administered 2016-01-24 (×4): 1 mg via INTRAVENOUS

## 2016-01-24 MED ORDER — FENTANYL CITRATE (PF) 100 MCG/2ML IJ SOLN
INTRAMUSCULAR | Status: DC | PRN
  Administered 2016-01-24 (×2): 12.5 ug via INTRAVENOUS
  Administered 2016-01-24: 25 ug via INTRAVENOUS

## 2016-01-24 MED ORDER — SODIUM CHLORIDE 0.9 % IR SOLN
80.0000 mg | Status: AC
  Administered 2016-01-24: 80 mg

## 2016-01-24 MED ORDER — ACETAMINOPHEN 325 MG PO TABS: 325.0000 mg | ORAL_TABLET | ORAL | Status: DC | PRN

## 2016-01-24 MED ORDER — CEFAZOLIN SODIUM-DEXTROSE 2-4 GM/100ML-% IV SOLN
INTRAVENOUS | Status: AC
Start: 2016-01-24 — End: 2016-01-24
  Filled 2016-01-24: qty 100

## 2016-01-24 MED ORDER — METHYLPREDNISOLONE SODIUM SUCC 125 MG IJ SOLR
125.0000 mg | Freq: Once | INTRAMUSCULAR | Status: AC
  Administered 2016-01-24: 125 mg via INTRAVENOUS

## 2016-01-24 MED ORDER — LIDOCAINE HCL (PF) 1 % IJ SOLN
INTRAMUSCULAR | Status: AC
  Filled 2016-01-24: qty 60

## 2016-01-24 MED ORDER — MUPIROCIN 2 % EX OINT
TOPICAL_OINTMENT | Freq: Once | CUTANEOUS | Status: AC
  Administered 2016-01-24: 1 via NASAL

## 2016-01-24 MED ORDER — MUPIROCIN 2 % EX OINT
TOPICAL_OINTMENT | CUTANEOUS | Status: AC
  Filled 2016-01-24: qty 22

## 2016-01-24 MED ORDER — DIPHENHYDRAMINE HCL 50 MG/ML IJ SOLN
25.0000 mg | Freq: Once | INTRAMUSCULAR | Status: AC
  Administered 2016-01-24: 25 mg via INTRAVENOUS

## 2016-01-24 MED ORDER — FAMOTIDINE IN NACL 20-0.9 MG/50ML-% IV SOLN
20.0000 mg | Freq: Once | INTRAVENOUS | Status: AC
  Administered 2016-01-24: 20 mg via INTRAVENOUS

## 2016-01-24 MED ORDER — HEPARIN (PORCINE) IN NACL 2-0.9 UNIT/ML-% IJ SOLN
INTRAMUSCULAR | Status: DC | PRN
  Administered 2016-01-24: 500 mL

## 2016-01-24 MED ORDER — SODIUM CHLORIDE 0.9 % IV SOLN
INTRAVENOUS | Status: DC
  Administered 2016-01-24: 06:00:00 via INTRAVENOUS

## 2016-01-24 SURGICAL SUPPLY — 4 items
CABLE SURGICAL S-101-97-12 (CABLE) ×1 IMPLANT
PAD DEFIB LIFELINK (PAD) ×1 IMPLANT
PPM CONSULTA CRT-P C4TR01 (Pacemaker) ×1 IMPLANT
TRAY PACEMAKER INSERTION (PACKS) ×1 IMPLANT

## 2016-01-24 NOTE — Discharge Instructions (Signed)
Pacemaker Battery Change, Care After °Refer to this sheet in the next few weeks. These instructions provide you with information on caring for yourself after your procedure. Your health care provider may also give you more specific instructions. Your treatment has been planned according to current medical practices, but problems sometimes occur. Call your health care provider if you have any problems or questions after your procedure. °WHAT TO EXPECT AFTER THE PROCEDURE °After your procedure, it is typical to have the following sensations: °· Soreness at the pacemaker site. °HOME CARE INSTRUCTIONS  °· Keep the incision clean and dry. °· Unless advised otherwise, you may shower beginning 48 hours after your procedure. °· For the first week after the replacement, avoid stretching motions that pull at the incision site, and avoid heavy exercise with the arm that is on the same side as the incision. °· Take medicines only as directed by your health care provider. °· Keep all follow-up visits as directed by your health care provider. °SEEK MEDICAL CARE IF:  °· You have pain at the incision site that is not relieved by over-the-counter or prescription medicine. °· There is drainage or pus from the incision site. °· There is swelling larger than a lime at the incision site. °· You develop red streaking that extends above or below the incision site. °· You feel brief, intermittent palpitations, light-headedness, or any symptoms that you feel might be related to your heart. °SEEK IMMEDIATE MEDICAL CARE IF:  °· You experience chest pain that is different than the pain at the pacemaker site. °· You experience shortness of breath. °· You have palpitations or irregular heartbeat. °· You have light-headedness that does not go away quickly. °· You faint. °· You have pain that gets worse and is not relieved by medicine. °This information is not intended to replace advice given to you by your health care provider. Make sure you  discuss any questions you have with your health care provider. °Document Released: 12/17/2012 Document Revised: 03/19/2014 Document Reviewed: 12/17/2012 °Elsevier Interactive Patient Education © 2017 [employer] ° °

## 2016-01-24 NOTE — H&P (Signed)
HPI [name] returns today for followup. She is a pleasant 65 yo woman with a non-ischemic CM, chronic systolic heart failure, LBBB, s/p BiV ICD implant. In the interim, she has not been bothered by diaphragmatic stimulation. Her device has reached ERI. She denies any ICD shock and she has not been in the hospital with heart failure. Her last echo was over a year ago.      Allergies  Allergen Reactions  . Ivp Dye [Iodinated Diagnostic Agents] Other (See Comments)    Reaction: rash, itching and peel  . Penicillins Other (See Comments)    REACTION: rash, swelling  . Latex Rash    Allergic to latex gloves  . Strawberry Extract Other (See Comments)    Reaction: rash, swelling           Current Outpatient Prescriptions  Medication Sig Dispense Refill  . aspirin 81 MG EC tablet Take 81 mg by mouth daily.     . carvedilol (COREG) 25 MG tablet Take 1 tablet (25 mg total) by mouth 2 (two) times daily with a meal. 90 tablet 3  . Cholecalciferol (VITAMIN D3) 1000 UNITS CAPS Take 1,000 Units by mouth daily.     . fexofenadine (ALLEGRA) 180 MG tablet Take 180 mg by mouth daily.      . furosemide (LASIX) 40 MG tablet Take 40 mg by mouth daily.    Marland Kitchen. levothyroxine (SYNTHROID) 200 MCG tablet Take 1 tablet (200 mcg total) by mouth daily before breakfast. 90 tablet 3  . lisinopril (PRINIVIL,ZESTRIL) 20 MG tablet Take 20 mg by mouth daily.    . rosuvastatin (CRESTOR) 40 MG tablet Take 1 tablet (40 mg total) by mouth daily. 90 tablet 3  . spironolactone (ALDACTONE) 50 MG tablet Take 1 tablet (50 mg total) by mouth daily. 90 tablet 3  . tiotropium (SPIRIVA) 18 MCG inhalation capsule Place 1 capsule (18 mcg total) into inhaler and inhale every morning. 90 capsule 3  . traMADol (ULTRAM) 50 MG tablet Take 50 mg by mouth every 8 (eight) hours as needed (pain).    . triamcinolone (NASACORT) 55 MCG/ACT AERO nasal inhaler Place 1 spray into the nose daily.     No current  facility-administered medications for this visit.          Past Medical History:  Diagnosis Date  . ALLERGIC RHINITIS 03/17/2008  . Anemia, iron deficiency   . Blood transfusion complicating pregnancy    after first child at 65 yo  . CAD (coronary artery disease)    Nonobstructive by cardiac catheter 8/12:EF 10-15%, circumflex calcified without obstructive CAD, proximal LAD 20%, mid LAD 20%, proximal RCA 20%, mid RCA 20%.  . Cardiomyopathy    a. remote h/o cath with reported nonobs CAD; b. echo 5/12: EF 15%, mild LVH, mild MR, LAE, mod pericardial effusion with increased filling pressures  . Cervical disc disease    with recurrent radicular pain, Dr. Montez Moritaarter  . COPD (chronic obstructive pulmonary disease) (HCC) 06/14/2010  . DJD (degenerative joint disease)    hands  . Genital warts    hx  . GOITER, MULTINODULAR 12/23/2009  . Hyperlipidemia 11/23/2015  . Hypertension   . Hypothyroid   . Myocardial infarction   . Systolic CHF (HCC)     ROS:   All systems reviewed and negative except as noted in the HPI.        Past Surgical History:  Procedure Laterality Date  . CARDIAC CATHETERIZATION  1995   with "timy blockage" [facility] Dr. Daisy Floro  . CARDIAC DEFIBRILLATOR PLACEMENT  01-22-11   by Dr.Jerre Vandrunen  . CHOLECYSTECTOMY    . IMPLANTABLE CARDIOVERTER DEFIBRILLATOR IMPLANT N/A 01/22/2011   Procedure: IMPLANTABLE CARDIOVERTER DEFIBRILLATOR IMPLANT;  Surgeon: Marinus Maw, MD;  Location: [facility] CATH LAB;  Service: Cardiovascular;  Laterality: N/A;           Family History  Problem Relation Age of Onset  . Stroke Mother   . Heart disease Mother   . Hypertension Mother   . Diabetes Mother   . Stroke Father   . Heart disease Father   . Hypertension Father   . Diabetes Father   . Cancer Sister     colon  . Heart disease Sister     pacemaker  . Hypertension Sister   . Depression Sister   . Diabetes Sister   . Thyroid  disease Sister     uncertain type  . Transient ischemic attack Maternal Grandfather   . Alcohol abuse Other   . Malignant hyperthermia Other      Social History        Social History  . Marital status: Married    Spouse name: N/A  . Number of children: 3  . Years of education: N/A       Occupational History  .  [employer]         Social History Main Topics  . Smoking status: Former Smoker    Packs/day: 0.25  . Smokeless tobacco: Not on file     Comment: very light, occasional stopped she states now  . Alcohol use No  . Drug use: No  . Sexual activity: No       Other Topics Concern  . Not on file      Social History Narrative  . No narrative on file     BP 124/84   Pulse 60   Ht 5\' 5"  (1.651 m)   Wt 166 lb (75.3 kg)   BMI 27.62 kg/m   Physical Exam:  Well appearing 65 yo woman, NAD HEENT: Unremarkable Neck:  7 cm JVD, no thyromegally Back:  No CVA tenderness Lungs:  Clear with no wheezes HEART:  Regular rate rhythm, no murmurs, no rubs, no clicks Abd:  soft, positive bowel sounds, no organomegally, no rebound, no guarding, diaghragmatic stimulation is present at baseline Ext:  2 plus pulses, no edema, no cyanosis, no clubbing Skin:  No rashes no nodules Neuro:  CN II through XII intact, motor grossly intact  DEVICE  Normal device function.  See PaceArt for details.   Assess/Plan: 1. Diaphragmatic stimulation - her LV lead has been reprogrammed and no stimulation. Her threshold tip to coil is 0.5 and her output has been left at 1.0. 2. ICD - her medtronic BiV ICD has been reprogrammed and is now working normally. Will recheck in several months.  3. Chronic systolic heart failure - her symptoms remain class 2. Will continue her current meds. Her EF is 40-45% and we will plan to downgrade to a biv ppm unless we cannot place a new lead without effecting diaphragmatic stimulation. 4. HTN - her blood pressure is well  controlled. She is encouraged to maintain a low sodium diet.  Leonia Reeves.D.

## 2016-01-25 MED FILL — Gentamicin Sulfate Inj 40 MG/ML: INTRAMUSCULAR | Qty: 2 | Status: AC

## 2016-01-25 MED FILL — Sodium Chloride Irrigation Soln 0.9%: Qty: 500 | Status: AC

## 2016-01-25 MED FILL — Cefazolin Sodium-Dextrose IV Solution 2 GM/100ML-4%: INTRAVENOUS | Qty: 100 | Status: AC

## 2016-01-26 NOTE — H&P (Signed)
ICD Criteria  Current LVEF:45%. Within 12 months prior to implant: Yes   Heart failure history: Yes, Class II  Cardiomyopathy history: Yes, Non-Ischemic Cardiomyopathy.  Atrial Fibrillation/Atrial Flutter: No.  Ventricular tachycardia history: No.  Cardiac arrest history: No.  History of syndromes with risk of sudden death: No.  Previous ICD: Yes, Reason for ICD:  Primary prevention.  Current ICD indication: Primary neither  PPM indication: No.   Class I or II Bradycardia indication present: No  Beta Blocker therapy for 3 or more months: Yes, prescribed.   Ace Inhibitor/ARB therapy for 3 or more months: Yes, prescribed.   This biv ICD was removed and replaced with a BiV PPM

## 2016-02-06 ENCOUNTER — Ambulatory Visit (INDEPENDENT_AMBULATORY_CARE_PROVIDER_SITE_OTHER): Payer: Medicare Other | Admitting: *Deleted

## 2016-02-06 DIAGNOSIS — I429 Cardiomyopathy, unspecified: Secondary | ICD-10-CM

## 2016-02-06 NOTE — Progress Notes (Signed)
Wound check appointment. Steri-strips removed. Wound without redness or edema. Incision edges approximated, wound well healed. Normal device function. Bi-Vp 99%. Thresholds, sensing, and impedances consistent with implant measurements. Device programmed at chronic outputs with adaptive on. Histogram distribution appropriate for patient and level of activity. No mode switches or high ventricular rates noted. Patient educated about wound care, arm mobility. ROV with GT 03/27/2016

## 2016-02-07 ENCOUNTER — Telehealth: Payer: Self-pay | Admitting: Cardiology

## 2016-02-07 ENCOUNTER — Encounter: Payer: Medicare Other | Admitting: *Deleted

## 2016-02-07 NOTE — Telephone Encounter (Signed)
LMOVM reminding pt to send remote transmission.   

## 2016-02-10 ENCOUNTER — Encounter: Payer: Self-pay | Admitting: Cardiology

## 2016-03-22 ENCOUNTER — Telehealth: Payer: Self-pay | Admitting: Internal Medicine

## 2016-03-22 NOTE — Telephone Encounter (Signed)
Called, spoke with pt. Pt stated she figured out why our office had called. It was to remind of upcoming appt with Dr. Ladona Ridgel on Tuesday.

## 2016-03-22 NOTE — Telephone Encounter (Signed)
New Message     Returning your call, said you hung up before she could pick up phone.  I could not find notes in computer, sorry.

## 2016-03-27 ENCOUNTER — Ambulatory Visit (INDEPENDENT_AMBULATORY_CARE_PROVIDER_SITE_OTHER): Payer: Medicare Other | Admitting: Internal Medicine

## 2016-03-27 ENCOUNTER — Encounter: Payer: Self-pay | Admitting: Internal Medicine

## 2016-03-27 VITALS — BP 144/88 | HR 60 | Ht 65.0 in | Wt 165.0 lb

## 2016-03-27 DIAGNOSIS — I5022 Chronic systolic (congestive) heart failure: Secondary | ICD-10-CM

## 2016-03-27 DIAGNOSIS — Z45018 Encounter for adjustment and management of other part of cardiac pacemaker: Secondary | ICD-10-CM | POA: Diagnosis not present

## 2016-03-27 LAB — CUP PACEART INCLINIC DEVICE CHECK
Battery Voltage: 3.03 V
Brady Statistic AP VP Percent: 32.94 %
Brady Statistic AS VP Percent: 66.95 %
Brady Statistic RA Percent Paced: 32.89 %
Brady Statistic RV Percent Paced: 99.69 %
Implantable Lead Implant Date: 20121112
Implantable Lead Implant Date: 20121112
Implantable Lead Location: 753858
Implantable Lead Model: 4194
Lead Channel Impedance Value: 323 Ohm
Lead Channel Impedance Value: 342 Ohm
Lead Channel Impedance Value: 361 Ohm
Lead Channel Impedance Value: 418 Ohm
Lead Channel Impedance Value: 475 Ohm
Lead Channel Impedance Value: 608 Ohm
Lead Channel Pacing Threshold Amplitude: 0.75 V
Lead Channel Pacing Threshold Pulse Width: 0.4 ms
Lead Channel Pacing Threshold Pulse Width: 0.4 ms
Lead Channel Sensing Intrinsic Amplitude: 9.25 mV
Lead Channel Setting Pacing Amplitude: 1.5 V
Lead Channel Setting Pacing Pulse Width: 0.4 ms
Lead Channel Setting Pacing Pulse Width: 1 ms
MDC IDC LEAD IMPLANT DT: 20121112
MDC IDC LEAD LOCATION: 753859
MDC IDC LEAD LOCATION: 753860
MDC IDC MSMT BATTERY REMAINING LONGEVITY: 94 mo
MDC IDC MSMT LEADCHNL LV IMPEDANCE VALUE: 475 Ohm
MDC IDC MSMT LEADCHNL LV IMPEDANCE VALUE: 551 Ohm
MDC IDC MSMT LEADCHNL RA PACING THRESHOLD AMPLITUDE: 0.5 V
MDC IDC MSMT LEADCHNL RA SENSING INTR AMPL: 3 mV
MDC IDC MSMT LEADCHNL RV IMPEDANCE VALUE: 399 Ohm
MDC IDC PG IMPLANT DT: 20171114
MDC IDC SESS DTM: 20180116113944
MDC IDC SET LEADCHNL LV PACING AMPLITUDE: 1.5 V
MDC IDC SET LEADCHNL RV PACING AMPLITUDE: 2 V
MDC IDC SET LEADCHNL RV SENSING SENSITIVITY: 2.8 mV
MDC IDC STAT BRADY AP VS PERCENT: 0.02 %
MDC IDC STAT BRADY AS VS PERCENT: 0.09 %

## 2016-03-27 NOTE — Progress Notes (Signed)
HPI [name] returns today for followup. She is a pleasant 66 yo woman with a non-ischemic CM, chronic systolic heart failure, LBBB, s/p BiV ICD implant. In the interim, she has not been stable but no diaphragmatic stimulation. She feels well and has had no syncope or edema. She has a tingling sensation over her PPM site. Allergies  Allergen Reactions  . Ivp Dye [Iodinated Diagnostic Agents] Itching and Rash    rash, itching & peel (patient has a MILDER reaction when given w/benadryl)  . Latex Rash    Allergic to latex gloves  . Penicillins Itching, Swelling, Rash and Other (See Comments)    Has patient had a PCN reaction causing immediate rash, facial/tongue/throat swelling, SOB or lightheadedness with hypotension:Yes Has patient had a PCN reaction causing severe rash involving mucus membranes or skin necrosis:Yes Has patient had a PCN reaction that required hospitalization:Pt. Was inpatient when reaction occurred Has patient had a PCN reaction occurring within the last 10 years: Yes If all of the above answers are "NO", then may proceed with Cephalosporin use.   . Strawberry Extract Swelling and Rash          Current Outpatient Prescriptions  Medication Sig Dispense Refill  . albuterol (PROVENTIL) (5 MG/ML) 0.5% nebulizer solution Take 2.5 mg by nebulization every 6 (six) hours as needed for wheezing or shortness of breath.    Marland Kitchen aspirin 81 MG EC tablet Take 81 mg by mouth daily.     . carvedilol (COREG) 25 MG tablet Take 1 tablet (25 mg total) by mouth 2 (two) times daily with a meal. 90 tablet 3  . Cholecalciferol (VITAMIN D3) 1000 UNITS CAPS Take 1,000 Units by mouth daily.     . fexofenadine (ALLEGRA) 180 MG tablet Take 180 mg by mouth daily.      . furosemide (LASIX) 40 MG tablet Take 40 mg by mouth daily.    . hydrocortisone cream 1 % Apply 1 application topically 3 (three) times daily as needed for itching.    . levothyroxine (SYNTHROID) 200 MCG tablet Take 1 tablet  (200 mcg total) by mouth daily before breakfast. 90 tablet 3  . lisinopril (PRINIVIL,ZESTRIL) 20 MG tablet Take 20 mg by mouth daily.    . rosuvastatin (CRESTOR) 40 MG tablet Take 1 tablet (40 mg total) by mouth daily. 90 tablet 3  . spironolactone (ALDACTONE) 50 MG tablet Take 1 tablet (50 mg total) by mouth daily. 90 tablet 3  . tiotropium (SPIRIVA) 18 MCG inhalation capsule Place 1 capsule (18 mcg total) into inhaler and inhale every morning. 90 capsule 3  . traMADol (ULTRAM) 50 MG tablet Take 50 mg by mouth every 8 (eight) hours as needed (pain).    . triamcinolone (NASACORT) 55 MCG/ACT AERO nasal inhaler Place 1 spray into the nose daily.     No current facility-administered medications for this visit.      Past Medical History:  Diagnosis Date  . ALLERGIC RHINITIS 03/17/2008  . Anemia, iron deficiency   . Blood transfusion complicating pregnancy    after first child at 39 yo  . CAD (coronary artery disease)    Nonobstructive by cardiac catheter 8/12:EF 10-15%, circumflex calcified without obstructive CAD, proximal LAD 20%, mid LAD 20%, proximal RCA 20%, mid RCA 20%.  . Cardiomyopathy    a. remote h/o cath with reported nonobs CAD; b. echo 5/12: EF 15%, mild LVH, mild MR, LAE, mod pericardial effusion with increased filling pressures  . Cervical disc  disease    with recurrent radicular pain, Dr. Montez Morita  . COPD (chronic obstructive pulmonary disease) (HCC) 06/14/2010  . DJD (degenerative joint disease)    hands  . Genital warts    hx  . GOITER, MULTINODULAR 12/23/2009  . Hyperlipidemia 11/23/2015  . Hypertension   . Hypothyroid   . Myocardial infarction   . Systolic CHF (HCC)     ROS:   All systems reviewed and negative except as noted in the HPI.   Past Surgical History:  Procedure Laterality Date  . CARDIAC CATHETERIZATION  1995   with "timy blockage" [facility] Dr. Daisy Floro  . CARDIAC DEFIBRILLATOR PLACEMENT  01-22-11   by Dr.Taylor  . CHOLECYSTECTOMY    . EP  IMPLANTABLE DEVICE N/A 01/24/2016   Procedure: ICD Generator Changeout;  Surgeon: Marinus Maw, MD;  Location: [facility] INVASIVE CV LAB;  Service: Cardiovascular;  Laterality: N/A;  . IMPLANTABLE CARDIOVERTER DEFIBRILLATOR IMPLANT N/A 01/22/2011   Procedure: IMPLANTABLE CARDIOVERTER DEFIBRILLATOR IMPLANT;  Surgeon: Marinus Maw, MD;  Location: [facility] CATH LAB;  Service: Cardiovascular;  Laterality: N/A;     Family History  Problem Relation Age of Onset  . Stroke Mother   . Heart disease Mother   . Hypertension Mother   . Diabetes Mother   . Stroke Father   . Heart disease Father   . Hypertension Father   . Diabetes Father   . Cancer Sister     colon  . Heart disease Sister     pacemaker  . Hypertension Sister   . Depression Sister   . Diabetes Sister   . Thyroid disease Sister     uncertain type  . Transient ischemic attack Maternal Grandfather   . Alcohol abuse Other   . Malignant hyperthermia Other      Social History   Social History  . Marital status: Divorced    Spouse name: N/A  . Number of children: 3  . Years of education: N/A   Occupational History  .  [employer]   Social History Main Topics  . Smoking status: Former Smoker    Packs/day: 0.25  . Smokeless tobacco: Not on file     Comment: very light, occasional stopped she states now  . Alcohol use No  . Drug use: No  . Sexual activity: No   Other Topics Concern  . Not on file   Social History Narrative  . No narrative on file     BP (!) 144/88   Pulse 60   Ht 5\' 5"  (1.651 m)   Wt 165 lb (74.8 kg)   BMI 27.46 kg/m   Physical Exam:  Well appearing 66 yo woman, NAD HEENT: Unremarkable Neck:  7 cm JVD, no thyromegally Back:  No CVA tenderness Lungs:  Clear with no wheezes HEART:  Regular rate rhythm, no murmurs, no rubs, no clicks Abd:  soft, positive bowel sounds, no organomegally, no rebound, no guarding, diaghragmatic stimulation is present at baseline Ext:  2 plus pulses, no  edema, no cyanosis, no clubbing Skin:  No rashes no nodules Neuro:  CN II through XII intact, motor grossly intact  DEVICE  Normal device function.  See PaceArt for details.   Assess/Plan: 1. Diaphragmatic stimulation - this has resolved. She will undergo watchful waiting 2. biv PPM - her medtronic BiV PPM is working normally. Will recheck in several months.  3. Chronic systolic heart failure - her symptoms remain class 2. Will continue her current meds. 4. HTN -  her blood pressure is well controlled. She is encouraged to maintain a low sodium diet.  Leonia Reeves.D.

## 2016-03-27 NOTE — Patient Instructions (Addendum)
Medication Instructions:    Your physician recommends that you continue on your current medications as directed. Please refer to the Current Medication list given to you today.  --- If you need a refill on your cardiac medications before your next appointment, please call your pharmacy. ---  Labwork:  None ordered  Testing/Procedures:  None ordered  Follow-Up: Remote monitoring is used to monitor your Pacemaker of ICD from home. This monitoring reduces the number of office visits required to check your device to one time per year. It allows us to keep an eye on the functioning of your device to ensure it is working properly. You are scheduled for a device check from home on 06/26/2016. You may send your transmission at any time that day. If you have a wireless device, the transmission will be sent automatically. After your physician reviews your transmission, you will receive a postcard with your next transmission date.   Your physician wants you to follow-up in: 1 year with Dr. Taylor.  You will receive a reminder letter in the mail two months in advance. If you don't receive a letter, please call our office to schedule the follow-up appointment.  Thank you for choosing [facility]!!      

## 2016-05-23 ENCOUNTER — Ambulatory Visit: Payer: Medicare Other | Admitting: Internal Medicine

## 2016-05-23 DIAGNOSIS — Z0289 Encounter for other administrative examinations: Secondary | ICD-10-CM

## 2016-06-05 ENCOUNTER — Other Ambulatory Visit: Payer: Self-pay | Admitting: Internal Medicine

## 2016-06-05 ENCOUNTER — Ambulatory Visit (INDEPENDENT_AMBULATORY_CARE_PROVIDER_SITE_OTHER): Payer: Medicare Other | Admitting: Internal Medicine

## 2016-06-05 ENCOUNTER — Other Ambulatory Visit (INDEPENDENT_AMBULATORY_CARE_PROVIDER_SITE_OTHER): Payer: Medicare Other

## 2016-06-05 ENCOUNTER — Encounter: Payer: Self-pay | Admitting: Internal Medicine

## 2016-06-05 VITALS — BP 148/88 | HR 80 | Temp 98.1°F | Wt 160.0 lb

## 2016-06-05 DIAGNOSIS — E785 Hyperlipidemia, unspecified: Secondary | ICD-10-CM

## 2016-06-05 DIAGNOSIS — I428 Other cardiomyopathies: Secondary | ICD-10-CM | POA: Diagnosis not present

## 2016-06-05 DIAGNOSIS — N189 Chronic kidney disease, unspecified: Secondary | ICD-10-CM

## 2016-06-05 DIAGNOSIS — E039 Hypothyroidism, unspecified: Secondary | ICD-10-CM

## 2016-06-05 DIAGNOSIS — I10 Essential (primary) hypertension: Secondary | ICD-10-CM | POA: Diagnosis not present

## 2016-06-05 LAB — BASIC METABOLIC PANEL
BUN: 14 mg/dL (ref 6–23)
CALCIUM: 9.6 mg/dL (ref 8.4–10.5)
CO2: 30 meq/L (ref 19–32)
Chloride: 104 mEq/L (ref 96–112)
Creatinine, Ser: 1.64 mg/dL — ABNORMAL HIGH (ref 0.40–1.20)
GFR: 40.11 mL/min — AB (ref >60.00)
Glucose, Bld: 88 mg/dL (ref 70–99)
Potassium: 3.7 mEq/L (ref 3.5–5.1)
SODIUM: 140 meq/L (ref 135–145)

## 2016-06-05 LAB — LIPID PANEL
CHOLESTEROL: 231 mg/dL — AB (ref 0–200)
HDL: 45.8 mg/dL (ref >39.00)
LDL Cholesterol: 162 mg/dL — ABNORMAL HIGH (ref 0–99)
NonHDL: 185.11
TRIGLYCERIDES: 116 mg/dL (ref 0.0–149.0)
Total CHOL/HDL Ratio: 5
VLDL: 23.2 mg/dL (ref 0.0–40.0)

## 2016-06-05 LAB — CBC WITH DIFFERENTIAL/PLATELET
BASOS PCT: 1.5 % (ref 0.0–3.0)
Basophils Absolute: 0 10*3/uL (ref 0.0–0.1)
EOS PCT: 2.3 % (ref 0.0–5.0)
Eosinophils Absolute: 0.1 10*3/uL (ref 0.0–0.7)
HCT: 35.9 % — ABNORMAL LOW (ref 36.0–46.0)
Hemoglobin: 11.9 g/dL — ABNORMAL LOW (ref 12.0–15.0)
LYMPHS ABS: 1.2 10*3/uL (ref 0.7–4.0)
Lymphocytes Relative: 43.5 % (ref 12.0–46.0)
MCHC: 33.1 g/dL (ref 30.0–36.0)
MCV: 86.6 fl (ref 78.0–100.0)
MONOS PCT: 5.9 % (ref 3.0–12.0)
Monocytes Absolute: 0.2 10*3/uL (ref 0.1–1.0)
NEUTROS ABS: 1.3 10*3/uL — AB (ref 1.4–7.7)
NEUTROS PCT: 46.8 % (ref 43.0–77.0)
PLATELETS: 209 10*3/uL (ref 150.0–400.0)
RBC: 4.15 Mil/uL (ref 3.87–5.11)
RDW: 15 % (ref 11.5–15.5)
WBC: 2.7 10*3/uL — ABNORMAL LOW (ref 4.0–10.5)

## 2016-06-05 LAB — HEPATIC FUNCTION PANEL
ALK PHOS: 85 U/L (ref 39–117)
ALT: 10 U/L (ref 0–35)
AST: 26 U/L (ref 0–37)
Albumin: 4.6 g/dL (ref 3.5–5.2)
BILIRUBIN DIRECT: 0.1 mg/dL (ref 0.0–0.3)
Total Bilirubin: 0.6 mg/dL (ref 0.2–1.2)
Total Protein: 8.4 g/dL — ABNORMAL HIGH (ref 6.0–8.3)

## 2016-06-05 LAB — TSH: TSH: 84.48 u[IU]/mL — ABNORMAL HIGH (ref 0.35–4.50)

## 2016-06-05 LAB — T4, FREE: Free T4: 0.22 ng/dL — ABNORMAL LOW (ref 0.60–1.60)

## 2016-06-05 MED ORDER — CARVEDILOL 25 MG PO TABS: 25.0000 mg | ORAL_TABLET | Freq: Two times a day (BID) | ORAL | 3 refills | Status: DC

## 2016-06-05 MED ORDER — TRIAMCINOLONE ACETONIDE 55 MCG/ACT NA AERO: 1.0000 | INHALATION_SPRAY | Freq: Every day | NASAL | 11 refills | Status: DC

## 2016-06-05 MED ORDER — LEVOTHYROXINE SODIUM 200 MCG PO TABS: 200.0000 ug | ORAL_TABLET | Freq: Every day | ORAL | 3 refills | Status: DC

## 2016-06-05 MED ORDER — ALBUTEROL SULFATE (5 MG/ML) 0.5% IN NEBU: 2.5000 mg | INHALATION_SOLUTION | Freq: Four times a day (QID) | RESPIRATORY_TRACT | 11 refills | Status: DC | PRN

## 2016-06-05 MED ORDER — SPIRONOLACTONE 50 MG PO TABS: 50.0000 mg | ORAL_TABLET | Freq: Every day | ORAL | 3 refills | Status: DC

## 2016-06-05 MED ORDER — TIOTROPIUM BROMIDE MONOHYDRATE 18 MCG IN CAPS: 18.0000 ug | ORAL_CAPSULE | RESPIRATORY_TRACT | 3 refills | Status: DC

## 2016-06-05 NOTE — Patient Instructions (Signed)

## 2016-06-05 NOTE — Progress Notes (Signed)
Pre visit review using our clinic review tool, if applicable. No additional management support is needed unless otherwise documented below in the visit note. 

## 2016-06-05 NOTE — Progress Notes (Signed)
Subjective:    Patient ID: [name], female    DOB: [birth year], 66 y.o.   MRN: [id number]  HPI Here to f/u; overall doing ok,  Pt denies chest pain, increasing sob or doe, wheezing, orthopnea, PND, increased LE swelling, palpitations, dizziness or syncope.  Pt denies new neurological symptoms such as new headache, or facial or extremity weakness or numbness.  Pt denies polydipsia, polyuria, or low sugar episode.   Pt denies new neurological symptoms such as new headache, or facial or extremity weakness or numbness.   Pt states overall good compliance with meds, except Out of thyroid med since nov 2017, for some reason rx not done to her pharmacy per pt. Has been out of BP meds for one wk.  Denies hyper or hypo thyroid symptoms such as voice, skin or hair change, but just "doesn't feel right".   s/p aicd change to PPM per pt.  Needs single tooth out, and for dentures soon.  Past Medical History:  Diagnosis Date  . ALLERGIC RHINITIS 03/17/2008  . Anemia, iron deficiency   . Blood transfusion complicating pregnancy    after first child at 61 yo  . CAD (coronary artery disease)    Nonobstructive by cardiac catheter 8/12:EF 10-15%, circumflex calcified without obstructive CAD, proximal LAD 20%, mid LAD 20%, proximal RCA 20%, mid RCA 20%.  . Cardiomyopathy    a. remote h/o cath with reported nonobs CAD; b. echo 5/12: EF 15%, mild LVH, mild MR, LAE, mod pericardial effusion with increased filling pressures  . Cervical disc disease    with recurrent radicular pain, Dr. Montez Morita  . COPD (chronic obstructive pulmonary disease) (HCC) 06/14/2010  . DJD (degenerative joint disease)    hands  . Genital warts    hx  . GOITER, MULTINODULAR 12/23/2009  . Hyperlipidemia 11/23/2015  . Hypertension   . Hypothyroid   . Myocardial infarction   . Systolic CHF [facility])    Past Surgical History:  Procedure Laterality Date  . CARDIAC CATHETERIZATION  1995   with "timy blockage" [facility] Dr. Daisy Floro  .  CARDIAC DEFIBRILLATOR PLACEMENT  01-22-11   by Dr.Taylor  . CHOLECYSTECTOMY    . EP IMPLANTABLE DEVICE N/A 01/24/2016   Procedure: ICD Generator Changeout;  Surgeon: Marinus Maw, MD;  Location: [facility] INVASIVE CV LAB;  Service: Cardiovascular;  Laterality: N/A;  . IMPLANTABLE CARDIOVERTER DEFIBRILLATOR IMPLANT N/A 01/22/2011   Procedure: IMPLANTABLE CARDIOVERTER DEFIBRILLATOR IMPLANT;  Surgeon: Marinus Maw, MD;  Location: [facility] CATH LAB;  Service: Cardiovascular;  Laterality: N/A;    reports that she has quit smoking. She smoked 0.25 packs per day. She has never used smokeless tobacco. She reports that she does not drink alcohol or use drugs. family history includes Alcohol abuse in her other; Cancer in her sister; Depression in her sister; Diabetes in her father, mother, and sister; Heart disease in her father, mother, and sister; Hypertension in her father, mother, and sister; Malignant hyperthermia in her other; Stroke in her father and mother; Thyroid disease in her sister; Transient ischemic attack in her maternal grandfather. Allergies  Allergen Reactions  . Ivp Dye [Iodinated Diagnostic Agents] Itching and Rash    rash, itching & peel (patient has a MILDER reaction when given w/benadryl)  . Latex Rash    Allergic to latex gloves  . Penicillins Itching, Swelling, Rash and Other (See Comments)    Has patient had a PCN reaction causing immediate rash, facial/tongue/throat swelling, SOB or lightheadedness with hypotension:Yes Has patient  had a PCN reaction causing severe rash involving mucus membranes or skin necrosis:Yes Has patient had a PCN reaction that required hospitalization:Pt. Was inpatient when reaction occurred Has patient had a PCN reaction occurring within the last 10 years: Yes If all of the above answers are "NO", then may proceed with Cephalosporin use.   . Strawberry Extract Swelling and Rash        Current Outpatient Prescriptions on File Prior to Visit  Medication Sig  Dispense Refill  . aspirin 81 MG EC tablet Take 81 mg by mouth daily.     . Cholecalciferol (VITAMIN D3) 1000 UNITS CAPS Take 1,000 Units by mouth daily.     . fexofenadine (ALLEGRA) 180 MG tablet Take 180 mg by mouth daily.      . furosemide (LASIX) 40 MG tablet Take 40 mg by mouth daily.    . hydrocortisone cream 1 % Apply 1 application topically 3 (three) times daily as needed for itching.    Marland Kitchen lisinopril (PRINIVIL,ZESTRIL) 20 MG tablet Take 20 mg by mouth daily.    . rosuvastatin (CRESTOR) 40 MG tablet Take 1 tablet (40 mg total) by mouth daily. 90 tablet 3   No current facility-administered medications on file prior to visit.     Review of Systems  Constitutional: Negative for other unusual diaphoresis or sweats HENT: Negative for ear discharge or swelling Eyes: Negative for other worsening visual disturbances Respiratory: Negative for stridor or other swelling  Gastrointestinal: Negative for worsening distension or other blood Genitourinary: Negative for retention or other urinary change Musculoskeletal: Negative for other MSK pain or swelling Skin: Negative for color change or other new lesions Neurological: Negative for worsening tremors and other numbness  Psychiatric/Behavioral: Negative for worsening agitation or other fatigue All other system neg per pt    Objective:   Physical Exam BP (!) 148/88   Pulse 80   Temp 98.1 F (36.7 C) (Oral)   Wt 160 lb (72.6 kg)   SpO2 98%   BMI 26.63 kg/m  VS noted,  Constitutional: Pt appears in NAD HENT: Head: NCAT.  Right Ear: External ear normal.  Left Ear: External ear normal.  Eyes: . Pupils are equal, round, and reactive to light. Conjunctivae and EOM are normal Nose: without d/c or deformity Neck: Neck supple. Gross normal ROM Cardiovascular: Normal rate and regular rhythm.   Pulmonary/Chest: Effort normal and breath sounds without rales or wheezing.  Neurological: Pt is alert. At baseline orientation, motor grossly  intact Skin: Skin is warm. No rashes, other new lesions, no LE edema Psychiatric: Pt behavior is normal without agitation  No other exam findings    Assessment & Plan:

## 2016-06-06 ENCOUNTER — Telehealth: Payer: Self-pay | Admitting: Internal Medicine

## 2016-06-07 ENCOUNTER — Telehealth: Payer: Self-pay

## 2016-06-07 NOTE — Telephone Encounter (Signed)
Followed up with patient in regard.

## 2016-06-07 NOTE — Telephone Encounter (Signed)
-----   Message from Corwin Levins, MD sent at 06/05/2016 12:39 PM EDT ----- Left message on MyChart, pt to cont same tx except  The test results show that your current treatment is OK, except the thyroid test is very bad this time, and the LDL cholesterol is still quite a bit elevated.  Please restart the medications of levothyroxine and crestor to treat this.  Please return for LAB ONLY in 4 wks for repeat testing. You should also hear from the office about this.  .    There is no other need for change of treatment or further evaluation based on these results, at this time.    Anoop Hemmer to please inform pt that she need repeat LAB ONLY in 4 wks (no office visit), I will place orders

## 2016-06-07 NOTE — Telephone Encounter (Signed)
Called pt, LVM to return call to go over lab results.

## 2016-06-09 NOTE — Assessment & Plan Note (Signed)
Stable by hx, also for f/u lab,

## 2016-06-09 NOTE — Assessment & Plan Note (Signed)
Likely elevated, for LDL, cont diet, restart med o/w stable overall by history and exam, recent data reviewed with pt, and pt to continue medical treatment as before,  to f/u any worsening symptoms or concerns

## 2016-06-09 NOTE — Assessment & Plan Note (Signed)
.  uncontrolled, to restart all home meds, o/w stable overall by history and exam, recent data reviewed with pt, and pt to continue medical treatment as before,  to f/u any worsening symptoms or concerns BP Readings from Last 3 Encounters:  06/05/16 (!) 148/88  03/27/16 (!) 144/88  01/24/16 (!) 143/83

## 2016-06-09 NOTE — Assessment & Plan Note (Signed)
Suspect uncontrolled, for med restart, check tsh, will likely need f/u TSH in 4 wks

## 2016-06-26 ENCOUNTER — Telehealth: Payer: Self-pay | Admitting: Cardiology

## 2016-06-26 ENCOUNTER — Ambulatory Visit (INDEPENDENT_AMBULATORY_CARE_PROVIDER_SITE_OTHER): Payer: Medicare Other | Admitting: *Deleted

## 2016-06-26 DIAGNOSIS — I429 Cardiomyopathy, unspecified: Secondary | ICD-10-CM

## 2016-06-26 NOTE — Telephone Encounter (Signed)
LMOVM reminding pt to send remote transmission.   

## 2016-06-26 NOTE — Progress Notes (Signed)
Remote pacemaker transmission.   

## 2016-06-28 LAB — CUP PACEART REMOTE DEVICE CHECK
Brady Statistic AP VP Percent: 26.93 %
Brady Statistic AP VS Percent: 0.01 %
Brady Statistic AS VS Percent: 0.03 %
Date Time Interrogation Session: 20180417230939
Implantable Lead Implant Date: 20121112
Implantable Lead Location: 753859
Implantable Pulse Generator Implant Date: 20171114
Lead Channel Impedance Value: 494 Ohm
Lead Channel Impedance Value: 532 Ohm
Lead Channel Impedance Value: 532 Ohm
Lead Channel Impedance Value: 646 Ohm
Lead Channel Pacing Threshold Amplitude: 0.5 V
Lead Channel Pacing Threshold Amplitude: 0.625 V
Lead Channel Sensing Intrinsic Amplitude: 3 mV
Lead Channel Setting Pacing Amplitude: 2 V
Lead Channel Setting Sensing Sensitivity: 2.8 mV
MDC IDC LEAD IMPLANT DT: 20121112
MDC IDC LEAD IMPLANT DT: 20121112
MDC IDC LEAD LOCATION: 753858
MDC IDC LEAD LOCATION: 753860
MDC IDC MSMT BATTERY REMAINING LONGEVITY: 82 mo
MDC IDC MSMT BATTERY VOLTAGE: 3.02 V
MDC IDC MSMT LEADCHNL LV IMPEDANCE VALUE: 399 Ohm
MDC IDC MSMT LEADCHNL LV IMPEDANCE VALUE: 760 Ohm
MDC IDC MSMT LEADCHNL RA IMPEDANCE VALUE: 342 Ohm
MDC IDC MSMT LEADCHNL RA IMPEDANCE VALUE: 380 Ohm
MDC IDC MSMT LEADCHNL RA PACING THRESHOLD PULSEWIDTH: 0.4 ms
MDC IDC MSMT LEADCHNL RA SENSING INTR AMPL: 3 mV
MDC IDC MSMT LEADCHNL RV IMPEDANCE VALUE: 418 Ohm
MDC IDC MSMT LEADCHNL RV PACING THRESHOLD PULSEWIDTH: 0.4 ms
MDC IDC MSMT LEADCHNL RV SENSING INTR AMPL: 9.25 mV
MDC IDC SET LEADCHNL LV PACING AMPLITUDE: 1.5 V
MDC IDC SET LEADCHNL LV PACING PULSEWIDTH: 1 ms
MDC IDC SET LEADCHNL RA PACING AMPLITUDE: 1.5 V
MDC IDC SET LEADCHNL RV PACING PULSEWIDTH: 0.4 ms
MDC IDC STAT BRADY AS VP PERCENT: 73.03 %
MDC IDC STAT BRADY RA PERCENT PACED: 26.93 %
MDC IDC STAT BRADY RV PERCENT PACED: 99.96 %

## 2016-06-29 ENCOUNTER — Encounter: Payer: Self-pay | Admitting: Cardiology

## 2016-07-17 ENCOUNTER — Encounter: Payer: Self-pay | Admitting: Cardiology

## 2016-09-11 NOTE — Telephone Encounter (Signed)
error 

## 2016-09-25 ENCOUNTER — Telehealth: Payer: Self-pay | Admitting: Cardiology

## 2016-09-25 ENCOUNTER — Encounter: Payer: Medicare Other | Admitting: *Deleted

## 2016-09-25 NOTE — Telephone Encounter (Signed)
LMOVM reminding pt to send remote transmission.   

## 2016-09-27 ENCOUNTER — Encounter: Payer: Self-pay | Admitting: Cardiology

## 2016-10-02 ENCOUNTER — Ambulatory Visit (INDEPENDENT_AMBULATORY_CARE_PROVIDER_SITE_OTHER): Payer: Medicare Other | Admitting: *Deleted

## 2016-10-02 DIAGNOSIS — I429 Cardiomyopathy, unspecified: Secondary | ICD-10-CM

## 2016-10-03 NOTE — Progress Notes (Signed)
Remote pacemaker transmission.   

## 2016-10-04 ENCOUNTER — Encounter: Payer: Self-pay | Admitting: Cardiology

## 2016-10-18 ENCOUNTER — Encounter: Payer: Self-pay | Admitting: Cardiology

## 2016-11-02 LAB — CUP PACEART REMOTE DEVICE CHECK
Battery Voltage: 3.02 V
Brady Statistic AP VS Percent: 0.01 %
Brady Statistic AS VP Percent: 84.99 %
Brady Statistic AS VS Percent: 0.53 %
Implantable Lead Implant Date: 20121112
Implantable Lead Location: 753858
Implantable Lead Location: 753859
Implantable Lead Location: 753860
Implantable Lead Model: 6947
Implantable Pulse Generator Implant Date: 20171114
Lead Channel Impedance Value: 418 Ohm
Lead Channel Impedance Value: 513 Ohm
Lead Channel Impedance Value: 532 Ohm
Lead Channel Impedance Value: 608 Ohm
Lead Channel Impedance Value: 741 Ohm
Lead Channel Pacing Threshold Amplitude: 0.5 V
Lead Channel Pacing Threshold Pulse Width: 0.4 ms
Lead Channel Sensing Intrinsic Amplitude: 4 mV
Lead Channel Sensing Intrinsic Amplitude: 9.25 mV
Lead Channel Setting Pacing Amplitude: 1.5 V
Lead Channel Setting Pacing Amplitude: 2 V
Lead Channel Setting Pacing Pulse Width: 0.4 ms
MDC IDC LEAD IMPLANT DT: 20121112
MDC IDC LEAD IMPLANT DT: 20121112
MDC IDC MSMT BATTERY REMAINING LONGEVITY: 75 mo
MDC IDC MSMT LEADCHNL LV IMPEDANCE VALUE: 855 Ohm
MDC IDC MSMT LEADCHNL RA IMPEDANCE VALUE: 361 Ohm
MDC IDC MSMT LEADCHNL RA IMPEDANCE VALUE: 399 Ohm
MDC IDC MSMT LEADCHNL RA SENSING INTR AMPL: 4 mV
MDC IDC MSMT LEADCHNL RV IMPEDANCE VALUE: 456 Ohm
MDC IDC MSMT LEADCHNL RV PACING THRESHOLD AMPLITUDE: 0.5 V
MDC IDC MSMT LEADCHNL RV PACING THRESHOLD PULSEWIDTH: 0.4 ms
MDC IDC SESS DTM: 20180725035548
MDC IDC SET LEADCHNL LV PACING PULSEWIDTH: 1 ms
MDC IDC SET LEADCHNL RA PACING AMPLITUDE: 1.5 V
MDC IDC SET LEADCHNL RV SENSING SENSITIVITY: 2.8 mV
MDC IDC STAT BRADY AP VP PERCENT: 14.48 %
MDC IDC STAT BRADY RA PERCENT PACED: 14.46 %
MDC IDC STAT BRADY RV PERCENT PACED: 99.36 %

## 2016-12-04 ENCOUNTER — Other Ambulatory Visit: Payer: Self-pay | Admitting: Internal Medicine

## 2016-12-04 ENCOUNTER — Telehealth: Payer: Self-pay

## 2016-12-04 ENCOUNTER — Encounter: Payer: Self-pay | Admitting: Internal Medicine

## 2016-12-04 ENCOUNTER — Ambulatory Visit (INDEPENDENT_AMBULATORY_CARE_PROVIDER_SITE_OTHER): Payer: Medicare Other | Admitting: Internal Medicine

## 2016-12-04 ENCOUNTER — Other Ambulatory Visit (INDEPENDENT_AMBULATORY_CARE_PROVIDER_SITE_OTHER): Payer: Medicare Other

## 2016-12-04 VITALS — BP 118/74 | HR 63 | Temp 97.9°F | Ht 65.0 in | Wt 165.0 lb

## 2016-12-04 DIAGNOSIS — R739 Hyperglycemia, unspecified: Secondary | ICD-10-CM | POA: Diagnosis not present

## 2016-12-04 DIAGNOSIS — Z23 Encounter for immunization: Secondary | ICD-10-CM | POA: Diagnosis not present

## 2016-12-04 DIAGNOSIS — N189 Chronic kidney disease, unspecified: Secondary | ICD-10-CM | POA: Diagnosis not present

## 2016-12-04 DIAGNOSIS — Z0000 Encounter for general adult medical examination without abnormal findings: Secondary | ICD-10-CM

## 2016-12-04 DIAGNOSIS — E039 Hypothyroidism, unspecified: Secondary | ICD-10-CM

## 2016-12-04 DIAGNOSIS — E2839 Other primary ovarian failure: Secondary | ICD-10-CM | POA: Diagnosis not present

## 2016-12-04 DIAGNOSIS — E785 Hyperlipidemia, unspecified: Secondary | ICD-10-CM | POA: Diagnosis not present

## 2016-12-04 LAB — CBC WITH DIFFERENTIAL/PLATELET
BASOS PCT: 1.2 % (ref 0.0–3.0)
Basophils Absolute: 0 10*3/uL (ref 0.0–0.1)
EOS PCT: 3.9 % (ref 0.0–5.0)
Eosinophils Absolute: 0.2 10*3/uL (ref 0.0–0.7)
HEMATOCRIT: 38.3 % (ref 36.0–46.0)
HEMOGLOBIN: 12.4 g/dL (ref 12.0–15.0)
LYMPHS PCT: 34.8 % (ref 12.0–46.0)
Lymphs Abs: 1.4 10*3/uL (ref 0.7–4.0)
MCHC: 32.4 g/dL (ref 30.0–36.0)
MCV: 86.1 fl (ref 78.0–100.0)
Monocytes Absolute: 0.3 10*3/uL (ref 0.1–1.0)
Monocytes Relative: 7.6 % (ref 3.0–12.0)
NEUTROS ABS: 2.1 10*3/uL (ref 1.4–7.7)
Neutrophils Relative %: 52.5 % (ref 43.0–77.0)
Platelets: 234 10*3/uL (ref 150.0–400.0)
RBC: 4.45 Mil/uL (ref 3.87–5.11)
RDW: 15.4 % (ref 11.5–15.5)
WBC: 3.9 10*3/uL — AB (ref 4.0–10.5)

## 2016-12-04 LAB — HEPATIC FUNCTION PANEL
ALT: 9 U/L (ref 0–35)
AST: 14 U/L (ref 0–37)
Albumin: 4.1 g/dL (ref 3.5–5.2)
Alkaline Phosphatase: 92 U/L (ref 39–117)
BILIRUBIN DIRECT: 0 mg/dL (ref 0.0–0.3)
BILIRUBIN TOTAL: 0.3 mg/dL (ref 0.2–1.2)
Total Protein: 7.7 g/dL (ref 6.0–8.3)

## 2016-12-04 LAB — LIPID PANEL
CHOL/HDL RATIO: 6
Cholesterol: 209 mg/dL — ABNORMAL HIGH (ref 0–200)
HDL: 35.7 mg/dL — ABNORMAL LOW (ref >39.00)
LDL CALC: 139 mg/dL — AB (ref 0–99)
NONHDL: 172.8
TRIGLYCERIDES: 171 mg/dL — AB (ref 0.0–149.0)
VLDL: 34.2 mg/dL (ref 0.0–40.0)

## 2016-12-04 LAB — BASIC METABOLIC PANEL
BUN: 10 mg/dL (ref 6–23)
CHLORIDE: 105 meq/L (ref 96–112)
CO2: 29 mEq/L (ref 19–32)
CREATININE: 1.07 mg/dL (ref 0.40–1.20)
Calcium: 9.5 mg/dL (ref 8.4–10.5)
GFR: 65.56 mL/min (ref >60.00)
Glucose, Bld: 98 mg/dL (ref 70–99)
POTASSIUM: 4 meq/L (ref 3.5–5.1)
Sodium: 140 mEq/L (ref 135–145)

## 2016-12-04 LAB — URINALYSIS, ROUTINE W REFLEX MICROSCOPIC
BILIRUBIN URINE: NEGATIVE
KETONES UR: NEGATIVE
Nitrite: POSITIVE — AB
PH: 6 (ref 5.0–8.0)
Specific Gravity, Urine: 1.025 (ref 1.000–1.030)
Urine Glucose: NEGATIVE
Urobilinogen, UA: 0.2 (ref 0.0–1.0)

## 2016-12-04 LAB — TSH: TSH: 32.46 u[IU]/mL — AB (ref 0.35–4.50)

## 2016-12-04 LAB — HEMOGLOBIN A1C: Hgb A1c MFr Bld: 6.4 % (ref 4.6–6.5)

## 2016-12-04 MED ORDER — LEVOTHYROXINE SODIUM 75 MCG PO TABS: 225.0000 ug | ORAL_TABLET | Freq: Every day | ORAL | 3 refills | Status: DC

## 2016-12-04 MED ORDER — NITROFURANTOIN MONOHYD MACRO 100 MG PO CAPS: 100.0000 mg | ORAL_CAPSULE | Freq: Two times a day (BID) | ORAL | 0 refills | Status: DC

## 2016-12-04 NOTE — Assessment & Plan Note (Signed)
Also for f/u lab today 

## 2016-12-04 NOTE — Assessment & Plan Note (Signed)

## 2016-12-04 NOTE — Telephone Encounter (Signed)
Called pt, LVM.   

## 2016-12-04 NOTE — Telephone Encounter (Signed)
-----   Message from Corwin Levins, MD sent at 12/04/2016  1:05 PM EDT ----- Left message on MyChart, pt to cont same tx except  The test results show that your current treatment is OK, except the TSH thyroid test is still high, meaning we will need to increase the thryoid medication dose.  Please change your current 200 mcg per day to THREE of 75 mcg tabs (for total 225 mg per day)  Please return to the LAB only in 4 wks for recheck blood work.  Also the urine test seems consistent with an incidental infection, so we will send an antibiotic also.  You should also hear from the office about this.    Shirron to please inform pt, I will change the thyroid rx, order antibiotic and future labs

## 2016-12-04 NOTE — Progress Notes (Signed)
Subjective:    Patient ID: [name], female    DOB: 05/29/1950, 66 y.o.   MRN: [id number]  HPI  Here for wellness and f/u;  Overall doing ok;  Pt denies Chest pain, worsening SOB, DOE, wheezing, orthopnea, PND, worsening LE edema, palpitations, dizziness or syncope.  Pt denies neurological change such as new headache, facial or extremity weakness.  Pt denies polydipsia, polyuria, or low sugar symptoms. Pt states overall good compliance with treatment and medications, good tolerability, and has been trying to follow appropriate diet.  Pt denies worsening depressive symptoms, suicidal ideation or panic. No fever, night sweats, wt loss, loss of appetite, or other constitutional symptoms.  Pt states good ability with ADL's, has low fall risk, home safety reviewed and adequate, no other significant changes in hearing or vision, and only occasionally active with exercise. Has device check coming up oct 23 every 3 mo, seen per cardiology every 6-8 mo.  Didn't "feel right" but this is much improved.  Still has single left lower tooth but not yet pulled until device checked, with plan for d/c tooth and dentures probably before dec 2018.  Had recent home Cologuard (FIT) per insurance nurse - negative.  Was told per Dr Rhea Belton per pt that she was apparently high risk for screening colonoscopy in 2013 No other new complaints Past Medical History:  Diagnosis Date  . ALLERGIC RHINITIS 03/17/2008  . Anemia, iron deficiency   . Blood transfusion complicating pregnancy    after first child at 73 yo  . CAD (coronary artery disease)    Nonobstructive by cardiac catheter 8/12:EF 10-15%, circumflex calcified without obstructive CAD, proximal LAD 20%, mid LAD 20%, proximal RCA 20%, mid RCA 20%.  . Cardiomyopathy    a. remote h/o cath with reported nonobs CAD; b. echo 5/12: EF 15%, mild LVH, mild MR, LAE, mod pericardial effusion with increased filling pressures  . Cervical disc disease    with recurrent radicular  pain, Dr. Montez Morita  . COPD (chronic obstructive pulmonary disease) (HCC) 06/14/2010  . DJD (degenerative joint disease)    hands  . Genital warts    hx  . GOITER, MULTINODULAR 12/23/2009  . Hyperlipidemia 11/23/2015  . Hypertension   . Hypothyroid   . Myocardial infarction (HCC)   . Systolic CHF [facility])    Past Surgical History:  Procedure Laterality Date  . CARDIAC CATHETERIZATION  1995   with "timy blockage" [facility] Dr. Daisy Floro  . CARDIAC DEFIBRILLATOR PLACEMENT  01-22-11   by Dr.Taylor  . CHOLECYSTECTOMY    . EP IMPLANTABLE DEVICE N/A 01/24/2016   Procedure: ICD Generator Changeout;  Surgeon: Marinus Maw, MD;  Location: [facility] INVASIVE CV LAB;  Service: Cardiovascular;  Laterality: N/A;  . IMPLANTABLE CARDIOVERTER DEFIBRILLATOR IMPLANT N/A 01/22/2011   Procedure: IMPLANTABLE CARDIOVERTER DEFIBRILLATOR IMPLANT;  Surgeon: Marinus Maw, MD;  Location: [facility] CATH LAB;  Service: Cardiovascular;  Laterality: N/A;    reports that she has quit smoking. She smoked 0.25 packs per day. She has never used smokeless tobacco. She reports that she does not drink alcohol or use drugs. family history includes Alcohol abuse in her other; Cancer in her sister; Depression in her sister; Diabetes in her father, mother, and sister; Heart disease in her father, mother, and sister; Hypertension in her father, mother, and sister; Malignant hyperthermia in her other; Stroke in her father and mother; Thyroid disease in her sister; Transient ischemic attack in her maternal grandfather. Allergies  Allergen Reactions  . Ivp Dye [  Iodinated Diagnostic Agents] Itching and Rash    rash, itching & peel (patient has a MILDER reaction when given w/benadryl)  . Latex Rash    Allergic to latex gloves  . Penicillins Itching, Swelling, Rash and Other (See Comments)    Has patient had a PCN reaction causing immediate rash, facial/tongue/throat swelling, SOB or lightheadedness with hypotension:Yes Has patient had a PCN  reaction causing severe rash involving mucus membranes or skin necrosis:Yes Has patient had a PCN reaction that required hospitalization:Pt. Was inpatient when reaction occurred Has patient had a PCN reaction occurring within the last 10 years: Yes If all of the above answers are "NO", then may proceed with Cephalosporin use.   . Strawberry Extract Swelling and Rash        Current Outpatient Prescriptions on File Prior to Visit  Medication Sig Dispense Refill  . albuterol (PROVENTIL) (5 MG/ML) 0.5% nebulizer solution Take 0.5 mLs (2.5 mg total) by nebulization every 6 (six) hours as needed for wheezing or shortness of breath. 20 mL 11  . aspirin 81 MG EC tablet Take 81 mg by mouth daily.     . carvedilol (COREG) 25 MG tablet Take 1 tablet (25 mg total) by mouth 2 (two) times daily with a meal. 90 tablet 3  . Cholecalciferol (VITAMIN D3) 1000 UNITS CAPS Take 1,000 Units by mouth daily.     . fexofenadine (ALLEGRA) 180 MG tablet Take 180 mg by mouth daily.      . furosemide (LASIX) 40 MG tablet Take 40 mg by mouth daily.    . hydrocortisone cream 1 % Apply 1 application topically 3 (three) times daily as needed for itching.    . levothyroxine (SYNTHROID) 200 MCG tablet Take 1 tablet (200 mcg total) by mouth daily before breakfast. 90 tablet 3  . lisinopril (PRINIVIL,ZESTRIL) 20 MG tablet Take 20 mg by mouth daily.    . rosuvastatin (CRESTOR) 40 MG tablet Take 1 tablet (40 mg total) by mouth daily. 90 tablet 3  . spironolactone (ALDACTONE) 50 MG tablet Take 1 tablet (50 mg total) by mouth daily. 90 tablet 3  . tiotropium (SPIRIVA) 18 MCG inhalation capsule Place 1 capsule (18 mcg total) into inhaler and inhale every morning. 90 capsule 3  . triamcinolone (NASACORT) 55 MCG/ACT AERO nasal inhaler Place 1 spray into the nose daily. 1 Inhaler 11   No current facility-administered medications on file prior to visit.    Review of Systems Constitutional: Negative for other unusual diaphoresis,  sweats, appetite or weight changes HENT: Negative for other worsening hearing loss, ear pain, facial swelling, mouth sores or neck stiffness.   Eyes: Negative for other worsening pain, redness or other visual disturbance.  Respiratory: Negative for other stridor or swelling Cardiovascular: Negative for other palpitations or other chest pain  Gastrointestinal: Negative for worsening diarrhea or loose stools, blood in stool, distention or other pain Genitourinary: Negative for hematuria, flank pain or other change in urine volume.  Musculoskeletal: Negative for myalgias or other joint swelling.  Skin: Negative for other color change, or other wound or worsening drainage.  Neurological: Negative for other syncope or numbness. Hematological: Negative for other adenopathy or swelling Psychiatric/Behavioral: Negative for hallucinations, other worsening agitation, SI, self-injury, or new decreased concentration All other system neg per pt    Objective:   Physical Exam BP 118/74   Pulse 63   Temp 97.9 F (36.6 C) (Oral)   Ht  (1.651 m)   Wt 165 lb (74.8 kg)  SpO2 98%   BMI 27.46 kg/m  VS noted,  Constitutional: Pt is oriented to person, place, and time. Appears well-developed and well-nourished, in no significant distress and comfortable Head: Normocephalic and atraumatic  Eyes: Conjunctivae and EOM are normal. Pupils are equal, round, and reactive to light Right Ear: External ear normal without discharge Left Ear: External ear normal without discharge Nose: Nose without discharge or deformity Mouth/Throat: Oropharynx is without other ulcerations and moist  Neck: Normal range of motion. Neck supple. No JVD present. No tracheal deviation present or significant neck LA or mass Cardiovascular: Normal rate, regular rhythm, normal heart sounds and intact distal pulses.   Pulmonary/Chest: WOB normal and breath sounds without rales or wheezing  Abdominal: Soft. Bowel sounds are normal. NT.  No HSM  Musculoskeletal: Normal range of motion. Exhibits no edema Lymphadenopathy: Has no other cervical adenopathy.  Neurological: Pt is alert and oriented to person, place, and time. Pt has normal reflexes. No cranial nerve deficit. Motor grossly intact, Gait intact Skin: Skin is warm and dry. No rash noted or new ulcerations Psychiatric:  Has normal mood and affect. Behavior is normal without agitation No other exam findings Lab Results  Component Value Date   WBC 2.7 (L) 06/05/2016   HGB 11.9 (L) 06/05/2016   HCT 35.9 (L) 06/05/2016   PLT 209.0 06/05/2016   GLUCOSE 88 06/05/2016   CHOL 231 (H) 06/05/2016   TRIG 116.0 06/05/2016   HDL 45.80 06/05/2016   LDLCALC 162 (H) 06/05/2016   ALT 10 06/05/2016   AST 26 06/05/2016   NA 140 06/05/2016   K 3.7 06/05/2016   CL 104 06/05/2016   CREATININE 1.64 (H) 06/05/2016   BUN 14 06/05/2016   CO2 30 06/05/2016   TSH 84.48 (H) 06/05/2016   INR 1.0 01/16/2011   HGBA1C (H) 06/06/2010    6.5 (NOTE)                                                                       According to the ADA Clinical Practice Recommendations for 2011, when HbA1c is used as a screening test:   >=6.5%   Diagnostic of Diabetes Mellitus           (if abnormal result  is confirmed)  5.7-6.4%   Increased risk of developing Diabetes Mellitus  References:Diagnosis and Classification of Diabetes Mellitus,Diabetes Care,2011,34(Suppl 1):S62-S69 and Standards of Medical Care in         Diabetes - 2011,Diabetes Care,2011,34  (Suppl 1):S11-S61.       Assessment & Plan:

## 2016-12-04 NOTE — Assessment & Plan Note (Signed)
Goal ldl < 70, for lab today

## 2016-12-04 NOTE — Patient Instructions (Addendum)
Please schedule the bone density test before leaving today at the scheduling desk (where you check out)  Please continue all other medications as before, and refills have been done if requested.  Please have the pharmacy call with any other refills you may need.  Please continue your efforts at being more active, low cholesterol diet, and weight control.  You are otherwise up to date with prevention measures today.  Please keep your appointments with your specialists as you may have planned  Please go to the LAB in the Basement (turn left off the elevator) for the tests to be done today  You will be contacted by phone if any changes need to be made immediately.  Otherwise, you will receive a letter about your results with an explanation, but please check with MyChart first.  Please remember to sign up for MyChart if you have not done so, as this will be important to you in the future with finding out test results, communicating by private email, and scheduling acute appointments online when needed.  Please return in 6 months, or sooner if needed, with Lab testing done 3-5 days before  

## 2016-12-04 NOTE — Assessment & Plan Note (Signed)
Asympt, for a1c with labs 

## 2016-12-05 NOTE — Telephone Encounter (Signed)
Pt returned your call. I gave her MD response. She expressed understanding and did not have any questions at this time.  °

## 2016-12-18 ENCOUNTER — Telehealth: Payer: Self-pay

## 2016-12-18 ENCOUNTER — Other Ambulatory Visit: Payer: Self-pay | Admitting: Internal Medicine

## 2016-12-18 ENCOUNTER — Ambulatory Visit (INDEPENDENT_AMBULATORY_CARE_PROVIDER_SITE_OTHER): Payer: Medicare Other | Admitting: *Deleted

## 2016-12-18 ENCOUNTER — Other Ambulatory Visit (INDEPENDENT_AMBULATORY_CARE_PROVIDER_SITE_OTHER): Payer: Medicare Other

## 2016-12-18 ENCOUNTER — Ambulatory Visit (INDEPENDENT_AMBULATORY_CARE_PROVIDER_SITE_OTHER)
Admission: RE | Admit: 2016-12-18 | Discharge: 2016-12-18 | Disposition: A | Discharge status: Home / Self Care | Payer: Medicare Other | Source: Ambulatory Visit | Attending: Internal Medicine | Admitting: Internal Medicine

## 2016-12-18 VITALS — BP 122/82 | HR 67 | Resp 20 | Ht 65.0 in | Wt 162.0 lb

## 2016-12-18 DIAGNOSIS — E039 Hypothyroidism, unspecified: Secondary | ICD-10-CM

## 2016-12-18 DIAGNOSIS — E2839 Other primary ovarian failure: Secondary | ICD-10-CM

## 2016-12-18 DIAGNOSIS — Z0000 Encounter for general adult medical examination without abnormal findings: Secondary | ICD-10-CM

## 2016-12-18 LAB — TSH: TSH: 0.28 u[IU]/mL — ABNORMAL LOW (ref 0.35–4.50)

## 2016-12-18 LAB — T4, FREE: FREE T4: 1.96 ng/dL — AB (ref 0.60–1.60)

## 2016-12-18 MED ORDER — LEVOTHYROXINE SODIUM 100 MCG PO TABS: 200.0000 ug | ORAL_TABLET | Freq: Every day | ORAL | 3 refills | Status: DC

## 2016-12-18 NOTE — Patient Instructions (Addendum)
Continue doing brain stimulating activities (puzzles, reading, adult coloring books, staying active) to keep memory sharp.   Continue to eat heart healthy diet (full of fruits, vegetables, whole grains, lean protein, water--limit salt, fat, and sugar intake) and increase physical activity as tolerated.   [name] , Thank you for taking time to come for your Medicare Wellness Visit. I appreciate your ongoing commitment to your health goals. Please review the following plan we discussed and let me know if I can assist you in the future.   These are the goals we discussed: Goals    . Maintain current health status          Continue to exercise, eat healthy, worship God, enjoy life, and family.       This is a list of the screening recommended for you and due dates:  Health Maintenance  Topic Date Due  . DEXA scan (bone density measurement)  10/14/2013  . Colon Cancer Screening  12/10/2017*  . Tetanus Vaccine  11/16/2019  . Flu Shot  Completed  .  Hepatitis C: One time screening is recommended by Center for Disease Control  (CDC) for  adults born from [birth year] through 1967.   Completed  . Pneumonia vaccines  Completed  *Topic was postponed. The date shown is not the original due date.

## 2016-12-18 NOTE — Telephone Encounter (Signed)
Pt has been notified and expressed understanding.

## 2016-12-18 NOTE — Progress Notes (Addendum)
Subjective:   [name] is a 66 y.o. female who presents for an Initial Medicare Annual Wellness Visit.  Review of Systems    No ROS.  Medicare Wellness Visit. Additional risk factors are reflected in the social history.   Cardiac Risk Factors include: advanced age (>25men, >21 women);dyslipidemia;hypertension Sleep patterns: feels rested on waking, gets up 1 times nightly to void and sleeps 7-8 hours nightly.    Home Safety/Smoke Alarms: Feels safe in home. Smoke alarms in place.  Living environment; residence and Firearm Safety: 1-story house/ trailer, no firearms Lives with daughter, no needs for DME, good support system. Seat Belt Safety/Bike Helmet: Wears seat belt.     Objective:    Today's Vitals   12/18/16 0811  BP: 122/82  Pulse: 67  Resp: 20  SpO2: 100%  Weight: 162 lb (73.5 kg)  Height:  (1.651 m)   Body mass index is 26.96 kg/m.   Current Medications (verified) Outpatient Encounter Prescriptions as of 12/18/2016  Medication Sig  . albuterol (PROVENTIL) (5 MG/ML) 0.5% nebulizer solution Take 0.5 mLs (2.5 mg total) by nebulization every 6 (six) hours as needed for wheezing or shortness of breath.  Marland Kitchen aspirin 81 MG EC tablet Take 81 mg by mouth daily.   . carvedilol (COREG) 25 MG tablet Take 1 tablet (25 mg total) by mouth 2 (two) times daily with a meal.  . Cholecalciferol (VITAMIN D3) 1000 UNITS CAPS Take 1,000 Units by mouth daily.   . fexofenadine (ALLEGRA) 180 MG tablet Take 180 mg by mouth daily.    . furosemide (LASIX) 40 MG tablet Take 40 mg by mouth daily.  . hydrocortisone cream 1 % Apply 1 application topically 3 (three) times daily as needed for itching.  Marland Kitchen lisinopril (PRINIVIL,ZESTRIL) 20 MG tablet Take 20 mg by mouth daily.  . rosuvastatin (CRESTOR) 40 MG tablet Take 1 tablet (40 mg total) by mouth daily.  Marland Kitchen spironolactone (ALDACTONE) 50 MG tablet Take 1 tablet (50 mg total) by mouth daily.  Marland Kitchen tiotropium (SPIRIVA) 18 MCG inhalation  capsule Place 1 capsule (18 mcg total) into inhaler and inhale every morning.  . triamcinolone (NASACORT) 55 MCG/ACT AERO nasal inhaler Place 1 spray into the nose daily.  . [DISCONTINUED] levothyroxine (SYNTHROID, LEVOTHROID) 75 MCG tablet Take 3 tablets (225 mcg total) by mouth daily.  . [DISCONTINUED] nitrofurantoin, macrocrystal-monohydrate, (MACROBID) 100 MG capsule Take 1 capsule (100 mg total) by mouth 2 (two) times daily. (Patient not taking: Reported on 12/18/2016)   No facility-administered encounter medications on file as of 12/18/2016.     Allergies (verified) Ivp dye [iodinated diagnostic agents]; Latex; Penicillins; and Strawberry extract   History: Past Medical History:  Diagnosis Date  . ALLERGIC RHINITIS 03/17/2008  . Anemia, iron deficiency   . Blood transfusion complicating pregnancy    after first child at 37 yo  . CAD (coronary artery disease)    Nonobstructive by cardiac catheter 8/12:EF 10-15%, circumflex calcified without obstructive CAD, proximal LAD 20%, mid LAD 20%, proximal RCA 20%, mid RCA 20%.  . Cardiomyopathy    a. remote h/o cath with reported nonobs CAD; b. echo 5/12: EF 15%, mild LVH, mild MR, LAE, mod pericardial effusion with increased filling pressures  . Cervical disc disease    with recurrent radicular pain, Dr. Montez Morita  . COPD (chronic obstructive pulmonary disease) (HCC) 06/14/2010  . DJD (degenerative joint disease)    hands  . Genital warts    hx  . GOITER, MULTINODULAR 12/23/2009  .  Hyperlipidemia 11/23/2015  . Hypertension   . Hypothyroid   . Myocardial infarction (HCC)   . Systolic CHF [facility])    Past Surgical History:  Procedure Laterality Date  . CARDIAC CATHETERIZATION  1995   with "timy blockage" [facility] Dr. Daisy Floro  . CARDIAC DEFIBRILLATOR PLACEMENT  01-22-11   by Dr.Taylor  . CHOLECYSTECTOMY    . EP IMPLANTABLE DEVICE N/A 01/24/2016   Procedure: ICD Generator Changeout;  Surgeon: Marinus Maw, MD;  Location: [facility] INVASIVE CV  LAB;  Service: Cardiovascular;  Laterality: N/A;  . IMPLANTABLE CARDIOVERTER DEFIBRILLATOR IMPLANT N/A 01/22/2011   Procedure: IMPLANTABLE CARDIOVERTER DEFIBRILLATOR IMPLANT;  Surgeon: Marinus Maw, MD;  Location: [facility] CATH LAB;  Service: Cardiovascular;  Laterality: N/A;   Family History  Problem Relation Age of Onset  . Stroke Mother   . Heart disease Mother   . Hypertension Mother   . Diabetes Mother   . Stroke Father   . Heart disease Father   . Hypertension Father   . Diabetes Father   . Cancer Sister        colon  . Heart disease Sister        pacemaker  . Hypertension Sister   . Depression Sister   . Diabetes Sister   . Thyroid disease Sister        uncertain type  . Transient ischemic attack Maternal Grandfather   . Alcohol abuse Other   . Malignant hyperthermia Other    Social History   Occupational History  .  [employer]   Social History Main Topics  . Smoking status: Former Smoker    Packs/day: 0.25  . Smokeless tobacco: Never Used     Comment: very light, occasional stopped she states now  . Alcohol use No  . Drug use: No  . Sexual activity: No    Tobacco Counseling Counseling given: Not Answered   Activities of Daily Living In your present state of health, do you have any difficulty performing the following activities: 12/18/2016 01/24/2016  Hearing? N N  Vision? N N  Difficulty concentrating or making decisions? N N  Walking or climbing stairs? N Y  Dressing or bathing? N N  Doing errands, shopping? N -  Preparing Food and eating ? N -  Using the Toilet? N -  In the past six months, have you accidently leaked urine? N -  Do you have problems with loss of bowel control? N -  Managing your Medications? N -  Managing your Finances? N -  Housekeeping or managing your Housekeeping? N -  Some recent data might be hidden    Immunizations and Health Maintenance Immunization History  Administered Date(s) Administered  . Influenza Split  12/12/2011  . Influenza, High Dose Seasonal PF 12/04/2016  . Influenza,inj,Quad PF,6+ Mos 11/23/2015  . Pneumococcal Conjugate-13 06/02/2014  . Pneumococcal Polysaccharide-23 11/23/2015  . Td 11/15/2009   Health Maintenance Due  Topic Date Due  . DEXA SCAN  10/14/2013    Patient Care Team: Corwin Levins, MD as PCP - General Ladona Ridgel Doylene Canning, MD as Consulting Physician (Cardiology)  Indicate any recent Medical Services you may have received from other than [facility] providers in the past year (date may be approximate).     Assessment:   This is a routine wellness examination for Faya.Physical assessment deferred to PCP.   Hearing/Vision screen Hearing Screening Comments: Able to hear conversational tones w/o difficulty. No issues reported.  Passed whisper test Vision Screening Comments: appointment  yearly Dr. Dione Booze  Dietary issues and exercise activities discussed: Current Exercise Habits: Home exercise routine, Type of exercise: strength training/weights;walking, Time (Minutes): 45, Frequency (Times/Week): 6, Weekly Exercise (Minutes/Week): 270, Intensity: Mild, Exercise limited by: None identified  Diet (meal preparation, eat out, water intake, caffeinated beverages, dairy products, fruits and vegetables): in general, a "healthy" diet  , well balanced, eats a variety of fruits and vegetables daily, limits salt, fat/cholesterol, sugar, caffeine, drinks 6-8 glasses of water daily.    Goals    . Maintain current health status          Continue to exercise, eat healthy, worship God, enjoy life, and family.      Depression Screen PHQ 2/9 Scores 12/18/2016 12/04/2016 06/05/2016 11/23/2015 06/02/2014  PHQ - 2 Score 0 0 0 0 0  PHQ- 9 Score 0 - - - -    Fall Risk Fall Risk  12/18/2016 12/04/2016 06/05/2016 11/23/2015 06/02/2014  Falls in the past year? No No No No No    Cognitive Function: MMSE - Mini Mental State Exam 12/18/2016  Orientation to time 5  Orientation to Place 5    Registration 3  Attention/ Calculation 5  Recall 2  Language- name 2 objects 2  Language- repeat 1  Language- follow 3 step command 3  Language- read & follow direction 1  Write a sentence 1  Copy design 1  Total score 29        Screening Tests Health Maintenance  Topic Date Due  . DEXA SCAN  10/14/2013  . COLONOSCOPY  12/10/2017 (Originally 10/15/1998)  . TETANUS/TDAP  11/16/2019  . INFLUENZA VACCINE  Completed  . Hepatitis C Screening  Completed  . PNA vac Low Risk Adult  Completed      Plan:    Continue doing brain stimulating activities (puzzles, reading, adult coloring books, staying active) to keep memory sharp.   Continue to eat heart healthy diet (full of fruits, vegetables, whole grains, lean protein, water--limit salt, fat, and sugar intake) and increase physical activity as tolerated.  I have personally reviewed and noted the following in the patient's chart:   . Medical and social history . Use of alcohol, tobacco or illicit drugs  . Current medications and supplements . Functional ability and status . Nutritional status . Physical activity . Advanced directives . List of other physicians . Vitals . Screenings to include cognitive, depression, and falls . Referrals and appointments  In addition, I have reviewed and discussed with patient certain preventive protocols, quality metrics, and best practice recommendations. A written personalized care plan for preventive services as well as general preventive health recommendations were provided to patient.     Wanda Plump, RN   12/18/2016   Medical screening examination/treatment/procedure(s) were performed by non-physician practitioner and as supervising physician I was immediately available for consultation/collaboration. I agree with above. Oliver Barre, MD

## 2016-12-18 NOTE — Telephone Encounter (Signed)
-----   Message from Corwin Levins, MD sent at 12/18/2016 12:53 PM EDT ----- [name] - correction -   We need to decrease the levothyroxine from 225 to 200 (not to decrease to 50)

## 2016-12-18 NOTE — Progress Notes (Signed)
Pre visit review using our clinic review tool, if applicable. No additional management support is needed unless otherwise documented below in the visit note. 

## 2017-01-01 ENCOUNTER — Ambulatory Visit (INDEPENDENT_AMBULATORY_CARE_PROVIDER_SITE_OTHER): Payer: Medicare Other | Admitting: *Deleted

## 2017-01-01 DIAGNOSIS — I429 Cardiomyopathy, unspecified: Secondary | ICD-10-CM | POA: Diagnosis not present

## 2017-01-02 NOTE — Progress Notes (Signed)
Remote pacemaker transmission.   

## 2017-01-03 LAB — CUP PACEART REMOTE DEVICE CHECK
Battery Remaining Longevity: 74 mo
Battery Voltage: 3.02 V
Brady Statistic AS VP Percent: 84.16 %
Brady Statistic RA Percent Paced: 15.06 %
Implantable Lead Implant Date: 20121112
Implantable Lead Location: 753858
Implantable Lead Location: 753859
Implantable Lead Location: 753860
Implantable Lead Model: 4194
Implantable Lead Model: 5076
Implantable Lead Model: 6947
Implantable Pulse Generator Implant Date: 20171114
Lead Channel Impedance Value: 418 Ohm
Lead Channel Impedance Value: 494 Ohm
Lead Channel Pacing Threshold Amplitude: 0.625 V
Lead Channel Sensing Intrinsic Amplitude: 3.5 mV
Lead Channel Sensing Intrinsic Amplitude: 9.25 mV
Lead Channel Setting Pacing Amplitude: 1.5 V
Lead Channel Setting Pacing Amplitude: 2 V
MDC IDC LEAD IMPLANT DT: 20121112
MDC IDC LEAD IMPLANT DT: 20121112
MDC IDC MSMT LEADCHNL LV IMPEDANCE VALUE: 532 Ohm
MDC IDC MSMT LEADCHNL LV IMPEDANCE VALUE: 646 Ohm
MDC IDC MSMT LEADCHNL LV IMPEDANCE VALUE: 741 Ohm
MDC IDC MSMT LEADCHNL LV IMPEDANCE VALUE: 874 Ohm
MDC IDC MSMT LEADCHNL RA IMPEDANCE VALUE: 342 Ohm
MDC IDC MSMT LEADCHNL RA IMPEDANCE VALUE: 399 Ohm
MDC IDC MSMT LEADCHNL RA PACING THRESHOLD AMPLITUDE: 0.5 V
MDC IDC MSMT LEADCHNL RA PACING THRESHOLD PULSEWIDTH: 0.4 ms
MDC IDC MSMT LEADCHNL RA SENSING INTR AMPL: 3.5 mV
MDC IDC MSMT LEADCHNL RV IMPEDANCE VALUE: 437 Ohm
MDC IDC MSMT LEADCHNL RV PACING THRESHOLD PULSEWIDTH: 0.4 ms
MDC IDC SESS DTM: 20181024075210
MDC IDC SET LEADCHNL LV PACING PULSEWIDTH: 1 ms
MDC IDC SET LEADCHNL RA PACING AMPLITUDE: 1.5 V
MDC IDC SET LEADCHNL RV PACING PULSEWIDTH: 0.4 ms
MDC IDC SET LEADCHNL RV SENSING SENSITIVITY: 2.8 mV
MDC IDC STAT BRADY AP VP PERCENT: 15.09 %
MDC IDC STAT BRADY AP VS PERCENT: 0.01 %
MDC IDC STAT BRADY AS VS PERCENT: 0.75 %
MDC IDC STAT BRADY RV PERCENT PACED: 99.1 %

## 2017-01-04 ENCOUNTER — Encounter: Payer: Self-pay | Admitting: Cardiology

## 2017-02-11 ENCOUNTER — Other Ambulatory Visit (INDEPENDENT_AMBULATORY_CARE_PROVIDER_SITE_OTHER): Payer: Medicare Other

## 2017-02-11 DIAGNOSIS — E039 Hypothyroidism, unspecified: Secondary | ICD-10-CM | POA: Diagnosis not present

## 2017-02-11 LAB — T4, FREE: Free T4: 0.6 ng/dL (ref 0.60–1.60)

## 2017-02-11 LAB — TSH: TSH: 8.52 u[IU]/mL — ABNORMAL HIGH (ref 0.35–4.50)

## 2017-04-02 ENCOUNTER — Ambulatory Visit (INDEPENDENT_AMBULATORY_CARE_PROVIDER_SITE_OTHER): Payer: Medicare Other | Admitting: *Deleted

## 2017-04-02 ENCOUNTER — Encounter: Payer: Medicare Other | Admitting: Internal Medicine

## 2017-04-02 ENCOUNTER — Telehealth: Payer: Self-pay | Admitting: Cardiology

## 2017-04-02 DIAGNOSIS — I429 Cardiomyopathy, unspecified: Secondary | ICD-10-CM | POA: Diagnosis not present

## 2017-04-02 NOTE — Telephone Encounter (Signed)
LMOVM reminding pt to send remote transmission.   

## 2017-04-03 ENCOUNTER — Encounter: Payer: Self-pay | Admitting: Internal Medicine

## 2017-04-03 ENCOUNTER — Encounter: Payer: Self-pay | Admitting: Cardiology

## 2017-04-03 NOTE — Progress Notes (Signed)
Remote pacemaker transmission.   

## 2017-04-05 LAB — CUP PACEART REMOTE DEVICE CHECK
Battery Remaining Longevity: 73 mo
Brady Statistic AP VP Percent: 14.1 %
Brady Statistic AS VS Percent: 0.9 %
Brady Statistic RA Percent Paced: 14.07 %
Brady Statistic RV Percent Paced: 98.93 %
Date Time Interrogation Session: 20190123061315
Implantable Lead Implant Date: 20121112
Implantable Lead Implant Date: 20121112
Implantable Lead Location: 753858
Implantable Lead Location: 753860
Lead Channel Impedance Value: 418 Ohm
Lead Channel Impedance Value: 437 Ohm
Lead Channel Impedance Value: 513 Ohm
Lead Channel Impedance Value: 570 Ohm
Lead Channel Impedance Value: 570 Ohm
Lead Channel Impedance Value: 741 Ohm
Lead Channel Impedance Value: 855 Ohm
Lead Channel Pacing Threshold Amplitude: 0.5 V
Lead Channel Pacing Threshold Pulse Width: 0.4 ms
Lead Channel Sensing Intrinsic Amplitude: 3.875 mV
Lead Channel Setting Pacing Amplitude: 1.5 V
Lead Channel Setting Pacing Pulse Width: 1 ms
Lead Channel Setting Sensing Sensitivity: 2.8 mV
MDC IDC LEAD IMPLANT DT: 20121112
MDC IDC LEAD LOCATION: 753859
MDC IDC MSMT BATTERY VOLTAGE: 3.02 V
MDC IDC MSMT LEADCHNL LV IMPEDANCE VALUE: 456 Ohm
MDC IDC MSMT LEADCHNL LV IMPEDANCE VALUE: 627 Ohm
MDC IDC MSMT LEADCHNL RA PACING THRESHOLD PULSEWIDTH: 0.4 ms
MDC IDC MSMT LEADCHNL RA SENSING INTR AMPL: 3.875 mV
MDC IDC MSMT LEADCHNL RV PACING THRESHOLD AMPLITUDE: 0.625 V
MDC IDC MSMT LEADCHNL RV SENSING INTR AMPL: 9.25 mV
MDC IDC PG IMPLANT DT: 20171114
MDC IDC SET LEADCHNL LV PACING AMPLITUDE: 1.5 V
MDC IDC SET LEADCHNL RV PACING AMPLITUDE: 2 V
MDC IDC SET LEADCHNL RV PACING PULSEWIDTH: 0.4 ms
MDC IDC STAT BRADY AP VS PERCENT: 0.01 %
MDC IDC STAT BRADY AS VP PERCENT: 84.99 %

## 2017-04-24 ENCOUNTER — Ambulatory Visit (INDEPENDENT_AMBULATORY_CARE_PROVIDER_SITE_OTHER): Payer: Medicare Other | Admitting: Internal Medicine

## 2017-04-24 ENCOUNTER — Other Ambulatory Visit (INDEPENDENT_AMBULATORY_CARE_PROVIDER_SITE_OTHER): Payer: Medicare Other

## 2017-04-24 ENCOUNTER — Encounter: Payer: Self-pay | Admitting: Internal Medicine

## 2017-04-24 ENCOUNTER — Ambulatory Visit (INDEPENDENT_AMBULATORY_CARE_PROVIDER_SITE_OTHER)
Admission: RE | Admit: 2017-04-24 | Discharge: 2017-04-24 | Disposition: A | Discharge status: Home / Self Care | Payer: Medicare Other | Source: Ambulatory Visit | Attending: Internal Medicine | Admitting: Internal Medicine

## 2017-04-24 VITALS — BP 116/78 | HR 73 | Temp 97.9°F | Ht 65.0 in | Wt 165.0 lb

## 2017-04-24 DIAGNOSIS — R739 Hyperglycemia, unspecified: Secondary | ICD-10-CM

## 2017-04-24 DIAGNOSIS — R05 Cough: Secondary | ICD-10-CM

## 2017-04-24 DIAGNOSIS — E039 Hypothyroidism, unspecified: Secondary | ICD-10-CM

## 2017-04-24 DIAGNOSIS — R062 Wheezing: Secondary | ICD-10-CM

## 2017-04-24 DIAGNOSIS — N189 Chronic kidney disease, unspecified: Secondary | ICD-10-CM

## 2017-04-24 DIAGNOSIS — R059 Cough, unspecified: Secondary | ICD-10-CM | POA: Insufficient documentation

## 2017-04-24 LAB — LIPID PANEL
CHOL/HDL RATIO: 5
CHOLESTEROL: 174 mg/dL (ref 0–200)
HDL: 33.6 mg/dL — ABNORMAL LOW (ref >39.00)
NonHDL: 140.21
Triglycerides: 218 mg/dL — ABNORMAL HIGH (ref 0.0–149.0)
VLDL: 43.6 mg/dL — AB (ref 0.0–40.0)

## 2017-04-24 LAB — BASIC METABOLIC PANEL
BUN: 15 mg/dL (ref 6–23)
CO2: 30 mEq/L (ref 19–32)
Calcium: 9.9 mg/dL (ref 8.4–10.5)
Chloride: 103 mEq/L (ref 96–112)
Creatinine, Ser: 1.15 mg/dL (ref 0.40–1.20)
GFR: 60.25 mL/min (ref >60.00)
GLUCOSE: 92 mg/dL (ref 70–99)
POTASSIUM: 4.4 meq/L (ref 3.5–5.1)
Sodium: 138 mEq/L (ref 135–145)

## 2017-04-24 LAB — HEMOGLOBIN A1C: Hgb A1c MFr Bld: 6.5 % (ref 4.6–6.5)

## 2017-04-24 LAB — TSH: TSH: 2.6 u[IU]/mL (ref 0.35–4.50)

## 2017-04-24 LAB — HEPATIC FUNCTION PANEL
ALT: 14 U/L (ref 0–35)
AST: 15 U/L (ref 0–37)
Albumin: 4.2 g/dL (ref 3.5–5.2)
Alkaline Phosphatase: 107 U/L (ref 39–117)
BILIRUBIN DIRECT: 0.1 mg/dL (ref 0.0–0.3)
BILIRUBIN TOTAL: 0.3 mg/dL (ref 0.2–1.2)
TOTAL PROTEIN: 8.4 g/dL — AB (ref 6.0–8.3)

## 2017-04-24 LAB — T4, FREE: Free T4: 0.86 ng/dL (ref 0.60–1.60)

## 2017-04-24 LAB — LDL CHOLESTEROL, DIRECT: LDL DIRECT: 92 mg/dL

## 2017-04-24 MED ORDER — PREDNISONE 10 MG PO TABS: ORAL_TABLET | ORAL | 0 refills | Status: DC

## 2017-04-24 MED ORDER — ALBUTEROL SULFATE (5 MG/ML) 0.5% IN NEBU: 2.5000 mg | INHALATION_SOLUTION | Freq: Four times a day (QID) | RESPIRATORY_TRACT | 11 refills | Status: DC | PRN

## 2017-04-24 MED ORDER — METHYLPREDNISOLONE ACETATE 80 MG/ML IJ SUSP
80.0000 mg | Freq: Once | INTRAMUSCULAR | Status: AC
  Administered 2017-04-24: 80 mg via INTRAMUSCULAR

## 2017-04-24 MED ORDER — HYDROCODONE-HOMATROPINE 5-1.5 MG/5ML PO SYRP: 5.0000 mL | ORAL_SOLUTION | Freq: Four times a day (QID) | ORAL | 0 refills | Status: AC | PRN

## 2017-04-24 MED ORDER — LEVOFLOXACIN 500 MG PO TABS: 500.0000 mg | ORAL_TABLET | Freq: Every day | ORAL | 0 refills | Status: AC

## 2017-04-24 NOTE — Assessment & Plan Note (Signed)
Mild to mod, for depomedrol IM 80, predpac asd, to f/u any worsening symptoms or concerns 

## 2017-04-24 NOTE — Assessment & Plan Note (Signed)
Now with better med compliance recently per pt, for f/u lab today

## 2017-04-24 NOTE — Assessment & Plan Note (Addendum)
Mild to mod, c/w bronchitis vs pna, for cxr, for antibx course,  to f/u any worsening symptoms or concerns 

## 2017-04-24 NOTE — Progress Notes (Signed)
Subjective:    Patient ID: [name], female    DOB: [birth year], 67 y.o.   MRN: [id number]  HPI  Here with acute onset mild to mod 2-3 days ST, HA, general weakness and malaise, with prod cough greenish sputum, but Pt denies chest pain, increased sob or doe, wheezing, orthopnea, PND, increased LE swelling, palpitations, dizziness or syncope, except for onset mild wheezing, sob since last PM   Denies hyper or hypo thyroid symptoms such as voice, skin or hair change. Now taking medication regularly, denies missing doses like before.   Pt denies polydipsia, polyuria Past Medical History:  Diagnosis Date  . ALLERGIC RHINITIS 03/17/2008  . Anemia, iron deficiency   . Blood transfusion complicating pregnancy    after first child at 55 yo  . CAD (coronary artery disease)    Nonobstructive by cardiac catheter 8/12:EF 10-15%, circumflex calcified without obstructive CAD, proximal LAD 20%, mid LAD 20%, proximal RCA 20%, mid RCA 20%.  . Cardiomyopathy    a. remote h/o cath with reported nonobs CAD; b. echo 5/12: EF 15%, mild LVH, mild MR, LAE, mod pericardial effusion with increased filling pressures  . Cervical disc disease    with recurrent radicular pain, Dr. Montez Morita  . COPD (chronic obstructive pulmonary disease) (HCC) 06/14/2010  . DJD (degenerative joint disease)    hands  . Genital warts    hx  . GOITER, MULTINODULAR 12/23/2009  . Hyperlipidemia 11/23/2015  . Hypertension   . Hypothyroid   . Myocardial infarction (HCC)   . Systolic CHF [facility])    Past Surgical History:  Procedure Laterality Date  . CARDIAC CATHETERIZATION  1995   with "timy blockage" [facility] Dr. Daisy Floro  . CARDIAC DEFIBRILLATOR PLACEMENT  01-22-11   by Dr.Taylor  . CHOLECYSTECTOMY    . EP IMPLANTABLE DEVICE N/A 01/24/2016   Procedure: ICD Generator Changeout;  Surgeon: Marinus Maw, MD;  Location: [facility] INVASIVE CV LAB;  Service: Cardiovascular;  Laterality: N/A;  . IMPLANTABLE CARDIOVERTER DEFIBRILLATOR  IMPLANT N/A 01/22/2011   Procedure: IMPLANTABLE CARDIOVERTER DEFIBRILLATOR IMPLANT;  Surgeon: Marinus Maw, MD;  Location: [facility] CATH LAB;  Service: Cardiovascular;  Laterality: N/A;    reports that she has quit smoking. She smoked 0.25 packs per day. she has never used smokeless tobacco. She reports that she does not drink alcohol or use drugs. family history includes Alcohol abuse in her other; Cancer in her sister; Depression in her sister; Diabetes in her father, mother, and sister; Heart disease in her father, mother, and sister; Hypertension in her father, mother, and sister; Malignant hyperthermia in her other; Stroke in her father and mother; Thyroid disease in her sister; Transient ischemic attack in her maternal grandfather. Allergies  Allergen Reactions  . Ivp Dye [Iodinated Diagnostic Agents] Itching and Rash    rash, itching & peel (patient has a MILDER reaction when given w/benadryl)  . Latex Rash    Allergic to latex gloves  . Penicillins Itching, Swelling, Rash and Other (See Comments)    Has patient had a PCN reaction causing immediate rash, facial/tongue/throat swelling, SOB or lightheadedness with hypotension:Yes Has patient had a PCN reaction causing severe rash involving mucus membranes or skin necrosis:Yes Has patient had a PCN reaction that required hospitalization:Pt. Was inpatient when reaction occurred Has patient had a PCN reaction occurring within the last 10 years: Yes If all of the above answers are "NO", then may proceed with Cephalosporin use.   . Strawberry Extract Swelling and Rash  Current Outpatient Medications on File Prior to Visit  Medication Sig Dispense Refill  . aspirin 81 MG EC tablet Take 81 mg by mouth daily.     . carvedilol (COREG) 25 MG tablet Take 1 tablet (25 mg total) by mouth 2 (two) times daily with a meal. 90 tablet 3  . Cholecalciferol (VITAMIN D3) 1000 UNITS CAPS Take 1,000 Units by mouth daily.     . fexofenadine (ALLEGRA) 180  MG tablet Take 180 mg by mouth daily.      . furosemide (LASIX) 40 MG tablet Take 40 mg by mouth daily.    . hydrocortisone cream 1 % Apply 1 application topically 3 (three) times daily as needed for itching.    . levothyroxine (SYNTHROID, LEVOTHROID) 100 MCG tablet Take 2 tablets (200 mcg total) by mouth daily. 180 tablet 3  . lisinopril (PRINIVIL,ZESTRIL) 20 MG tablet Take 20 mg by mouth daily.    . rosuvastatin (CRESTOR) 40 MG tablet Take 1 tablet (40 mg total) by mouth daily. 90 tablet 3  . spironolactone (ALDACTONE) 50 MG tablet Take 1 tablet (50 mg total) by mouth daily. 90 tablet 3  . tiotropium (SPIRIVA) 18 MCG inhalation capsule Place 1 capsule (18 mcg total) into inhaler and inhale every morning. 90 capsule 3  . triamcinolone (NASACORT) 55 MCG/ACT AERO nasal inhaler Place 1 spray into the nose daily. 1 Inhaler 11   No current facility-administered medications on file prior to visit.    Review of Systems  Constitutional: Negative for other unusual diaphoresis or sweats HENT: Negative for ear discharge or swelling Eyes: Negative for other worsening visual disturbances Respiratory: Negative for stridor or other swelling  Gastrointestinal: Negative for worsening distension or other blood Genitourinary: Negative for retention or other urinary change Musculoskeletal: Negative for other MSK pain or swelling Skin: Negative for color change or other new lesions Neurological: Negative for worsening tremors and other numbness  Psychiatric/Behavioral: Negative for worsening agitation or other fatigue All other system neg per pt    Objective:   Physical Exam BP 116/78   Pulse 73   Temp 97.9 F (36.6 C) (Oral)   Ht 5\' 5"  (1.651 m)   Wt 165 lb (74.8 kg)   SpO2 98%   BMI 27.46 kg/m  VS noted, mild ill Constitutional: Pt appears in NAD HENT: Head: NCAT.  Right Ear: External ear normal.  Left Ear: External ear normal.  Eyes: . Pupils are equal, round, and reactive to light.  Conjunctivae and EOM are normal Nose: without d/c or deformity Neck: Neck supple. Gross normal ROM Cardiovascular: Normal rate and regular rhythm.   Pulmonary/Chest: Effort normal and breath sounds decreased without rales but with few scattered  bilat wheezing.  Neurological: Pt is alert. At baseline orientation, motor grossly intact Skin: Skin is warm. No rashes, other new lesions, no LE edema Psychiatric: Pt behavior is normal without agitation  No other exam findings  Lab Results  Component Value Date   WBC 3.9 (L) 12/04/2016   HGB 12.4 12/04/2016   HCT 38.3 12/04/2016   PLT 234.0 12/04/2016   GLUCOSE 98 12/04/2016   CHOL 209 (H) 12/04/2016   TRIG 171.0 (H) 12/04/2016   HDL 35.70 (L) 12/04/2016   LDLCALC 139 (H) 12/04/2016   ALT 9 12/04/2016   AST 14 12/04/2016   NA 140 12/04/2016   K 4.0 12/04/2016   CL 105 12/04/2016   CREATININE 1.07 12/04/2016   BUN 10 12/04/2016   CO2 29 12/04/2016   TSH  8.52 (H) 02/11/2017   INR 1.0 01/16/2011   HGBA1C 6.4 12/04/2016       Assessment & Plan:

## 2017-04-24 NOTE — Patient Instructions (Signed)
You had the steroid shot today   Please take all new medication as prescribed - the antibiotic, prednisone, and cough medicine if needed  Please continue all other medications as before, including the albuterol nebulizer medication as needed for sob and wheezing  Please have the pharmacy call with any other refills you may need.  Please keep your appointments with your specialists as you may have planned  Please go to the XRAY Department in the Basement (go straight as you get off the elevator) for the x-ray testing  Please go to the LAB in the Basement (turn left off the elevator) for the tests to be done today  You will be contacted by phone if any changes need to be made immediately.  Otherwise, you will receive a letter about your results with an explanation, but please check with MyChart first.  Please remember to sign up for MyChart if you have not done so, as this will be important to you in the future with finding out test results, communicating by private email, and scheduling acute appointments online when needed.

## 2017-05-16 ENCOUNTER — Telehealth: Payer: Self-pay | Admitting: Cardiology

## 2017-05-16 NOTE — Telephone Encounter (Signed)
Called and spoke to patient.  Patient reports she weighed this AM and her weight was 170 but states she just weighed again and her weight was 164. States her usual weight is 153-165.   States she ate black olives on her salad the other day that were high in salt that may be contributing to this.  Patient reports she takes furosemide 60 mg daily and spironolactone 50 daily.   Patient reports SOB with exertion but denies LE edema.  States she saw her PCP on 2/13 for cough and was informed she may need to see cardiology (note in epic).   Advised she is overdue for appointment with Dr. Ladona Ridgel and has not seen Dr. Jens Som since 2016.   Advised I would send message to schedulers to assist with follow up appt.  Advised to monitor salt intake and keep monitoring weight as well.   Routed to MD for review.  Message to scheduler for follow up appt.

## 2017-05-16 NOTE — Telephone Encounter (Signed)
[employer] calling for pt to let us know she has gained 7-6 lbs in 2 days-pls advise [phone]

## 2017-05-16 NOTE — Telephone Encounter (Signed)
Agree with plan Akeel Reffner  

## 2017-05-27 ENCOUNTER — Telehealth: Payer: Self-pay | Admitting: Internal Medicine

## 2017-05-27 ENCOUNTER — Telehealth: Payer: Self-pay

## 2017-05-27 NOTE — Telephone Encounter (Signed)
Received notes from P. Uvaldo Rising. Sent to [facility].

## 2017-05-27 NOTE — Telephone Encounter (Signed)
New Message  Pt c/o swelling: STAT is pt has developed SOB within 24 hours  1) How much weight have you gained and in what time span? 5lbs in 24hrs  2) If swelling, where is the swelling located? No noticeable swelling  3) Are you currently taking a fluid pill? yes  4) Are you currently SOB? no  5) Do you have a log of your daily weights (if so, list)? n/a  6) Have you gained 3 pounds in a day or 5 pounds in a week? 5lbs in a day  7) Have you traveled recently? no

## 2017-05-28 NOTE — Telephone Encounter (Signed)
Left VM for [facility] nurse.  Notified Pt is past due for appt with Dr. Ladona Ridgel.   Also hasn't seen primary cardiologist Dr. Jens Som since 2016.   Notified would give information to scheduling to see Pt sooner.  If not symptomatic have her continue to watch her salt and fluids.  No further action at this time.

## 2017-06-05 ENCOUNTER — Encounter: Payer: Self-pay | Admitting: Internal Medicine

## 2017-06-05 ENCOUNTER — Ambulatory Visit (INDEPENDENT_AMBULATORY_CARE_PROVIDER_SITE_OTHER): Payer: Medicare Other | Admitting: Internal Medicine

## 2017-06-05 VITALS — BP 122/68 | HR 67 | Temp 97.8°F | Ht 65.0 in | Wt 170.0 lb

## 2017-06-05 DIAGNOSIS — I428 Other cardiomyopathies: Secondary | ICD-10-CM | POA: Diagnosis not present

## 2017-06-05 DIAGNOSIS — I10 Essential (primary) hypertension: Secondary | ICD-10-CM

## 2017-06-05 DIAGNOSIS — E039 Hypothyroidism, unspecified: Secondary | ICD-10-CM

## 2017-06-05 DIAGNOSIS — R739 Hyperglycemia, unspecified: Secondary | ICD-10-CM | POA: Diagnosis not present

## 2017-06-05 DIAGNOSIS — E785 Hyperlipidemia, unspecified: Secondary | ICD-10-CM | POA: Diagnosis not present

## 2017-06-05 MED ORDER — LISINOPRIL 20 MG PO TABS: 20.0000 mg | ORAL_TABLET | Freq: Every day | ORAL | 3 refills | Status: DC

## 2017-06-05 MED ORDER — LEVOTHYROXINE SODIUM 100 MCG PO TABS: 200.0000 ug | ORAL_TABLET | Freq: Every day | ORAL | 3 refills | Status: DC

## 2017-06-05 MED ORDER — ROSUVASTATIN CALCIUM 40 MG PO TABS: 40.0000 mg | ORAL_TABLET | Freq: Every day | ORAL | 3 refills | Status: DC

## 2017-06-05 MED ORDER — SPIRONOLACTONE 50 MG PO TABS: 50.0000 mg | ORAL_TABLET | Freq: Every day | ORAL | 3 refills | Status: DC

## 2017-06-05 MED ORDER — CARVEDILOL 25 MG PO TABS: 25.0000 mg | ORAL_TABLET | Freq: Two times a day (BID) | ORAL | 3 refills | Status: DC

## 2017-06-05 MED ORDER — FUROSEMIDE 40 MG PO TABS: 40.0000 mg | ORAL_TABLET | Freq: Every day | ORAL | 3 refills | Status: DC

## 2017-06-05 NOTE — Patient Instructions (Addendum)
Please continue all other medications as before, and refills have been done if requested.  Please have the pharmacy call with any other refills you may need.  Please continue your efforts at being more active, low cholesterol diet, and weight control.  You are otherwise up to date with prevention measures today.  Please keep your appointments with your specialists as you may have planned  No further lab work needed today  Please return in 6 months, or sooner if needed, with Lab testing done 3-5 days before  

## 2017-06-05 NOTE — Progress Notes (Signed)
Subjective:    Patient ID: [name], female    DOB: 07-05-1950, 67 y.o.   MRN: [id number]  HPI  Here to f/u; overall doing ok,  Pt denies chest pain, increasing sob or doe, wheezing, orthopnea, PND, increased LE swelling, palpitations, dizziness or syncope.  Pt denies new neurological symptoms such as new headache, or facial or extremity weakness or numbness.  Pt denies polydipsia, polyuria, or low sugar episode.  Pt states overall good compliance with meds, mostly trying to follow appropriate diet, with wt overall stable,  but little exercise however. Less walking recently due to illness. Denies hyper or hypo thyroid symptoms such as voice, skin or hair change. Wt Readings from Last 3 Encounters:  06/05/17 170 lb (77.1 kg)  04/24/17 165 lb (74.8 kg)  12/18/16 162 lb (73.5 kg)   BP Readings from Last 3 Encounters:  06/05/17 122/68  04/24/17 116/78  12/18/16 122/82  Has appt Friday with cardiology later this wk  No other interval hx or new complaint Past Medical History:  Diagnosis Date  . ALLERGIC RHINITIS 03/17/2008  . Anemia, iron deficiency   . Blood transfusion complicating pregnancy    after first child at 6 yo  . CAD (coronary artery disease)    Nonobstructive by cardiac catheter 8/12:EF 10-15%, circumflex calcified without obstructive CAD, proximal LAD 20%, mid LAD 20%, proximal RCA 20%, mid RCA 20%.  . Cardiomyopathy    a. remote h/o cath with reported nonobs CAD; b. echo 5/12: EF 15%, mild LVH, mild MR, LAE, mod pericardial effusion with increased filling pressures  . Cervical disc disease    with recurrent radicular pain, Dr. Montez Morita  . COPD (chronic obstructive pulmonary disease) (HCC) 06/14/2010  . DJD (degenerative joint disease)    hands  . Genital warts    hx  . GOITER, MULTINODULAR 12/23/2009  . Hyperlipidemia 11/23/2015  . Hypertension   . Hypothyroid   . Myocardial infarction (HCC)   . Systolic CHF [facility])    Past Surgical History:  Procedure Laterality  Date  . CARDIAC CATHETERIZATION  1995   with "timy blockage" [facility] Dr. Daisy Floro  . CARDIAC DEFIBRILLATOR PLACEMENT  01-22-11   by Dr.Taylor  . CHOLECYSTECTOMY    . EP IMPLANTABLE DEVICE N/A 01/24/2016   Procedure: ICD Generator Changeout;  Surgeon: Marinus Maw, MD;  Location: [facility] INVASIVE CV LAB;  Service: Cardiovascular;  Laterality: N/A;  . IMPLANTABLE CARDIOVERTER DEFIBRILLATOR IMPLANT N/A 01/22/2011   Procedure: IMPLANTABLE CARDIOVERTER DEFIBRILLATOR IMPLANT;  Surgeon: Marinus Maw, MD;  Location: [facility] CATH LAB;  Service: Cardiovascular;  Laterality: N/A;    reports that she has quit smoking. She smoked 0.25 packs per day. She has never used smokeless tobacco. She reports that she does not drink alcohol or use drugs. family history includes Alcohol abuse in her other; Cancer in her sister; Depression in her sister; Diabetes in her father, mother, and sister; Heart disease in her father, mother, and sister; Hypertension in her father, mother, and sister; Malignant hyperthermia in her other; Stroke in her father and mother; Thyroid disease in her sister; Transient ischemic attack in her maternal grandfather. Allergies  Allergen Reactions  . Ivp Dye [Iodinated Diagnostic Agents] Itching and Rash    rash, itching & peel (patient has a MILDER reaction when given w/benadryl)  . Latex Rash    Allergic to latex gloves  . Penicillins Itching, Swelling, Rash and Other (See Comments)    Has patient had a PCN reaction causing immediate rash,  facial/tongue/throat swelling, SOB or lightheadedness with hypotension:Yes Has patient had a PCN reaction causing severe rash involving mucus membranes or skin necrosis:Yes Has patient had a PCN reaction that required hospitalization:Pt. Was inpatient when reaction occurred Has patient had a PCN reaction occurring within the last 10 years: Yes If all of the above answers are "NO", then may proceed with Cephalosporin use.   . Strawberry Extract Swelling  and Rash        Current Outpatient Medications on File Prior to Visit  Medication Sig Dispense Refill  . albuterol (PROVENTIL) (5 MG/ML) 0.5% nebulizer solution Take 0.5 mLs (2.5 mg total) by nebulization every 6 (six) hours as needed for wheezing or shortness of breath. 20 mL 11  . aspirin 81 MG EC tablet Take 81 mg by mouth daily.     . Cholecalciferol (VITAMIN D3) 1000 UNITS CAPS Take 1,000 Units by mouth daily.     . fexofenadine (ALLEGRA) 180 MG tablet Take 180 mg by mouth daily.      . hydrocortisone cream 1 % Apply 1 application topically 3 (three) times daily as needed for itching.    . tiotropium (SPIRIVA) 18 MCG inhalation capsule Place 1 capsule (18 mcg total) into inhaler and inhale every morning. 90 capsule 3  . triamcinolone (NASACORT) 55 MCG/ACT AERO nasal inhaler Place 1 spray into the nose daily. 1 Inhaler 11   No current facility-administered medications on file prior to visit.    Review of Systems  Constitutional: Negative for other unusual diaphoresis or sweats HENT: Negative for ear discharge or swelling Eyes: Negative for other worsening visual disturbances Respiratory: Negative for stridor or other swelling  Gastrointestinal: Negative for worsening distension or other blood Genitourinary: Negative for retention or other urinary change Musculoskeletal: Negative for other MSK pain or swelling Skin: Negative for color change or other new lesions Neurological: Negative for worsening tremors and other numbness  Psychiatric/Behavioral: Negative for worsening agitation or other fatigue All other system neg per pt    Objective:   Physical Exam BP 122/68 (BP Location: Left Arm, Patient Position: Sitting, Cuff Size: Normal)   Pulse 67   Temp 97.8 F (36.6 C) (Oral)   Ht 5\' 5"  (1.651 m)   Wt 170 lb (77.1 kg)   SpO2 97%   BMI 28.29 kg/m  VS noted,  Constitutional: Pt appears in NAD HENT: Head: NCAT.  Right Ear: External ear normal.  Left Ear: External ear  normal.  Eyes: . Pupils are equal, round, and reactive to light. Conjunctivae and EOM are normal Nose: without d/c or deformity Neck: Neck supple. Gross normal ROM Cardiovascular: Normal rate and regular rhythm.   Pulmonary/Chest: Effort normal and breath sounds without rales or wheezing.  Neurological: Pt is alert. At baseline orientation, motor grossly intact Skin: Skin is warm. No rashes, other new lesions, no LE edema Psychiatric: Pt behavior is normal without agitation  No other exam findings  Lab Results  Component Value Date   WBC 3.9 (L) 12/04/2016   HGB 12.4 12/04/2016   HCT 38.3 12/04/2016   PLT 234.0 12/04/2016   GLUCOSE 92 04/24/2017   CHOL 174 04/24/2017   TRIG 218.0 (H) 04/24/2017   HDL 33.60 (L) 04/24/2017   LDLDIRECT 92.0 04/24/2017   LDLCALC 139 (H) 12/04/2016   ALT 14 04/24/2017   AST 15 04/24/2017   NA 138 04/24/2017   K 4.4 04/24/2017   CL 103 04/24/2017   CREATININE 1.15 04/24/2017   BUN 15 04/24/2017   CO2 30  04/24/2017   TSH 2.60 04/24/2017   INR 1.0 01/16/2011   HGBA1C 6.5 04/24/2017       Assessment & Plan:

## 2017-06-07 ENCOUNTER — Encounter: Payer: Self-pay | Admitting: Internal Medicine

## 2017-06-07 ENCOUNTER — Ambulatory Visit (INDEPENDENT_AMBULATORY_CARE_PROVIDER_SITE_OTHER): Payer: Medicare Other | Admitting: Internal Medicine

## 2017-06-07 VITALS — BP 126/76 | HR 86 | Ht 65.0 in | Wt 166.0 lb

## 2017-06-07 DIAGNOSIS — I5022 Chronic systolic (congestive) heart failure: Secondary | ICD-10-CM

## 2017-06-07 DIAGNOSIS — Z95810 Presence of automatic (implantable) cardiac defibrillator: Secondary | ICD-10-CM | POA: Diagnosis not present

## 2017-06-07 DIAGNOSIS — I429 Cardiomyopathy, unspecified: Secondary | ICD-10-CM

## 2017-06-07 LAB — CUP PACEART INCLINIC DEVICE CHECK
Brady Statistic AP VS Percent: 0.01 %
Brady Statistic AS VS Percent: 0.67 %
Implantable Lead Implant Date: 20121112
Implantable Lead Location: 753859
Implantable Lead Model: 5076
Implantable Pulse Generator Implant Date: 20171114
Lead Channel Impedance Value: 380 Ohm
Lead Channel Impedance Value: 456 Ohm
Lead Channel Impedance Value: 513 Ohm
Lead Channel Impedance Value: 703 Ohm
Lead Channel Pacing Threshold Amplitude: 0.5 V
Lead Channel Pacing Threshold Amplitude: 0.625 V
Lead Channel Pacing Threshold Pulse Width: 0.4 ms
Lead Channel Setting Pacing Amplitude: 1.5 V
Lead Channel Setting Pacing Pulse Width: 1 ms
MDC IDC LEAD IMPLANT DT: 20121112
MDC IDC LEAD IMPLANT DT: 20121112
MDC IDC LEAD LOCATION: 753858
MDC IDC LEAD LOCATION: 753860
MDC IDC MSMT BATTERY REMAINING LONGEVITY: 70 mo
MDC IDC MSMT BATTERY VOLTAGE: 3.01 V
MDC IDC MSMT LEADCHNL LV IMPEDANCE VALUE: 418 Ohm
MDC IDC MSMT LEADCHNL LV IMPEDANCE VALUE: 589 Ohm
MDC IDC MSMT LEADCHNL LV IMPEDANCE VALUE: 817 Ohm
MDC IDC MSMT LEADCHNL RA IMPEDANCE VALUE: 361 Ohm
MDC IDC MSMT LEADCHNL RA SENSING INTR AMPL: 3.75 mV
MDC IDC MSMT LEADCHNL RA SENSING INTR AMPL: 4.25 mV
MDC IDC MSMT LEADCHNL RV IMPEDANCE VALUE: 513 Ohm
MDC IDC MSMT LEADCHNL RV PACING THRESHOLD PULSEWIDTH: 0.4 ms
MDC IDC MSMT LEADCHNL RV SENSING INTR AMPL: 10.25 mV
MDC IDC MSMT LEADCHNL RV SENSING INTR AMPL: 9.375 mV
MDC IDC SESS DTM: 20190329163158
MDC IDC SET LEADCHNL LV PACING AMPLITUDE: 1.5 V
MDC IDC SET LEADCHNL RV PACING AMPLITUDE: 2 V
MDC IDC SET LEADCHNL RV PACING PULSEWIDTH: 0.4 ms
MDC IDC SET LEADCHNL RV SENSING SENSITIVITY: 2.8 mV
MDC IDC STAT BRADY AP VP PERCENT: 18.68 %
MDC IDC STAT BRADY AS VP PERCENT: 80.64 %
MDC IDC STAT BRADY RA PERCENT PACED: 18.64 %
MDC IDC STAT BRADY RV PERCENT PACED: 99.11 %

## 2017-06-07 NOTE — Progress Notes (Signed)
HPI Mrs. Bartosh returns today for followup. She is a pleasant 67 yo woman with a non-ischemic CM, chronic systolic heart failure, LBBB, s/p BiV ICD implant. In the interim, she has had no chest pain and her CHF is class 2. She feels well and has had no syncope or edema. She has not had any diaphragmatic stimulation.   Allergies  Allergen Reactions  . Ivp Dye [Iodinated Diagnostic Agents] Itching and Rash    rash, itching & peel (patient has a MILDER reaction when given w/benadryl)  . Latex Rash    Allergic to latex gloves  . Penicillins Itching, Swelling, Rash and Other (See Comments)    Has patient had a PCN reaction causing immediate rash, facial/tongue/throat swelling, SOB or lightheadedness with hypotension:Yes Has patient had a PCN reaction causing severe rash involving mucus membranes or skin necrosis:Yes Has patient had a PCN reaction that required hospitalization:Pt. Was inpatient when reaction occurred Has patient had a PCN reaction occurring within the last 10 years: Yes If all of the above answers are "NO", then may proceed with Cephalosporin use.   . Strawberry Extract Swelling and Rash          Current Outpatient Medications  Medication Sig Dispense Refill  . albuterol (PROVENTIL) (5 MG/ML) 0.5% nebulizer solution Take 0.5 mLs (2.5 mg total) by nebulization every 6 (six) hours as needed for wheezing or shortness of breath. 20 mL 11  . aspirin 81 MG EC tablet Take 81 mg by mouth daily.     . carvedilol (COREG) 25 MG tablet Take 1 tablet (25 mg total) by mouth 2 (two) times daily with a meal. 90 tablet 3  . Cholecalciferol (VITAMIN D3) 1000 UNITS CAPS Take 1,000 Units by mouth daily.     . fexofenadine (ALLEGRA) 180 MG tablet Take 180 mg by mouth daily.      . furosemide (LASIX) 40 MG tablet Take 1 tablet (40 mg total) by mouth daily. 90 tablet 3  . hydrocortisone cream 1 % Apply 1 application topically 3 (three) times daily as needed for itching.    . levothyroxine  (SYNTHROID, LEVOTHROID) 100 MCG tablet Take 2 tablets (200 mcg total) by mouth daily. 180 tablet 3  . lisinopril (PRINIVIL,ZESTRIL) 20 MG tablet Take 1 tablet (20 mg total) by mouth daily. 90 tablet 3  . rosuvastatin (CRESTOR) 40 MG tablet Take 1 tablet (40 mg total) by mouth daily. 90 tablet 3  . spironolactone (ALDACTONE) 50 MG tablet Take 1 tablet (50 mg total) by mouth daily. 90 tablet 3  . tiotropium (SPIRIVA) 18 MCG inhalation capsule Place 1 capsule (18 mcg total) into inhaler and inhale every morning. 90 capsule 3  . triamcinolone (NASACORT) 55 MCG/ACT AERO nasal inhaler Place 1 spray into the nose daily. 1 Inhaler 11   No current facility-administered medications for this visit.      Past Medical History:  Diagnosis Date  . ALLERGIC RHINITIS 03/17/2008  . Anemia, iron deficiency   . Blood transfusion complicating pregnancy    after first child at 84 yo  . CAD (coronary artery disease)    Nonobstructive by cardiac catheter 8/12:EF 10-15%, circumflex calcified without obstructive CAD, proximal LAD 20%, mid LAD 20%, proximal RCA 20%, mid RCA 20%.  . Cardiomyopathy    a. remote h/o cath with reported nonobs CAD; b. echo 5/12: EF 15%, mild LVH, mild MR, LAE, mod pericardial effusion with increased filling pressures  . Cervical disc disease    with recurrent  radicular pain, Dr. Montez Morita  . COPD (chronic obstructive pulmonary disease) (HCC) 06/14/2010  . DJD (degenerative joint disease)    hands  . Genital warts    hx  . GOITER, MULTINODULAR 12/23/2009  . Hyperlipidemia 11/23/2015  . Hypertension   . Hypothyroid   . Myocardial infarction (HCC)   . Systolic CHF (HCC)     ROS:   All systems reviewed and negative except as noted in the HPI.   Past Surgical History:  Procedure Laterality Date  . CARDIAC CATHETERIZATION  1995   with "timy blockage" [facility] Dr. Daisy Floro  . CARDIAC DEFIBRILLATOR PLACEMENT  01-22-11   by Dr.Mariadel Mruk  . CHOLECYSTECTOMY    . EP IMPLANTABLE DEVICE  N/A 01/24/2016   Procedure: ICD Generator Changeout;  Surgeon: Marinus Maw, MD;  Location: [facility] INVASIVE CV LAB;  Service: Cardiovascular;  Laterality: N/A;  . IMPLANTABLE CARDIOVERTER DEFIBRILLATOR IMPLANT N/A 01/22/2011   Procedure: IMPLANTABLE CARDIOVERTER DEFIBRILLATOR IMPLANT;  Surgeon: Marinus Maw, MD;  Location: [facility] CATH LAB;  Service: Cardiovascular;  Laterality: N/A;     Family History  Problem Relation Age of Onset  . Stroke Mother   . Heart disease Mother   . Hypertension Mother   . Diabetes Mother   . Stroke Father   . Heart disease Father   . Hypertension Father   . Diabetes Father   . Cancer Sister        colon  . Heart disease Sister        pacemaker  . Hypertension Sister   . Depression Sister   . Diabetes Sister   . Thyroid disease Sister        uncertain type  . Transient ischemic attack Maternal Grandfather   . Alcohol abuse Other   . Malignant hyperthermia Other      Social History   Socioeconomic History  . Marital status: Divorced    Spouse name: Not on file  . Number of children: 3  . Years of education: Not on file  . Highest education level: Not on file  Occupational History    Employer: [employer]  Social Needs  . Financial resource strain: Not on file  . Food insecurity:    Worry: Not on file    Inability: Not on file  . Transportation needs:    Medical: Not on file    Non-medical: Not on file  Tobacco Use  . Smoking status: Former Smoker    Packs/day: 0.25  . Smokeless tobacco: Never Used  . Tobacco comment: very light, occasional stopped she states now  Substance and Sexual Activity  . Alcohol use: No    Alcohol/week: 0.0 oz  . Drug use: No  . Sexual activity: Never  Lifestyle  . Physical activity:    Days per week: Not on file    Minutes per session: Not on file  . Stress: Not on file  Relationships  . Social connections:    Talks on phone: Not on file    Gets together: Not on file    Attends religious  service: Not on file    Active member of club or organization: Not on file    Attends meetings of clubs or organizations: Not on file    Relationship status: Not on file  . Intimate partner violence:    Fear of current or ex partner: Not on file    Emotionally abused: Not on file    Physically abused: Not on file    Forced  sexual activity: Not on file  Other Topics Concern  . Not on file  Social History Narrative  . Not on file     BP 126/76   Pulse 86   Ht 5\' 5"  (1.651 m)   Wt 166 lb (75.3 kg)   SpO2 97%   BMI 27.62 kg/m   Physical Exam:  Well appearing 67 yo woman, NAD HEENT: Unremarkable Neck:  6 cm JVD, no thyromegally Lymphatics:  No adenopathy Back:  No CVA tenderness Lungs:  Clear with no wheezes HEART:  Regular rate rhythm, no murmurs, no rubs, no clicks Abd:  soft, positive bowel sounds, no organomegally, no rebound, no guarding Ext:  2 plus pulses, no edema, no cyanosis, no clubbing Skin:  No rashes no nodules Neuro:  CN II through XII intact, motor grossly intact  EKG - NSR with biv pacing  DEVICE  Normal device function.  See PaceArt for details.   Assess/Plan: 1. Chronic systolic heart failure - her symptoms are class 2. She will continue her current meds.  2. BiV PPM - her medtronic biv ppm is working normally. Will recheck in several months. 3. HTN - her blood pressure is well controlled. Will follow. 4. Dyslipidemia - she will continue her statin therapy and maintain a low fat diet. I ghave asked her to increase her physical activity.  Leonia Reeves.D.

## 2017-06-07 NOTE — Patient Instructions (Signed)
Medication Instructions: Your physician recommends that you continue on your current medications as directed. Please refer to the Current Medication list given to you today.  Labwork: None Ordered  Procedures/Testing: None Ordered  Follow-Up: Your physician wants you to follow-up in: 1 YEAR with Dr. Ladona Ridgel. You will receive a reminder letter in the mail two months in advance. If you don't receive a letter, please call our office to schedule the follow-up appointment.  Remote monitoring is used to monitor your Pacemaker of  from home. This monitoring reduces the number of office visits required to check your device to one time per year. It allows [place] to keep an eye on the functioning of your device to ensure it is working properly. You are scheduled for a device check from home on 07/02/17.. You may send your transmission at any time that day. If you have a wireless device, the transmission will be sent automatically. After your physician reviews your transmission, you will receive a postcard with your next transmission date.    If you need a refill on your cardiac medications before your next appointment, please call your pharmacy.

## 2017-06-08 NOTE — Assessment & Plan Note (Signed)
Lab Results  Component Value Date   TSH 2.60 04/24/2017  stable overall by history and exam, recent data reviewed with pt, and pt to continue medical treatment as before,  to f/u any worsening symptoms or concerns

## 2017-06-08 NOTE — Assessment & Plan Note (Signed)
stable overall by history and exam, recent data reviewed with pt, and pt to continue medical treatment as before,  to f/u any worsening symptoms or concerns Lab Results  Component Value Date   HGBA1C 6.5 04/24/2017

## 2017-06-08 NOTE — Assessment & Plan Note (Signed)
stable overall by history and exam, recent data reviewed with pt, and pt to continue medical treatment as before,  to f/u any worsening symptoms or concerns, for f/u lab today 

## 2017-06-08 NOTE — Assessment & Plan Note (Signed)
stable overall by history and exam, recent data reviewed with pt, and pt to continue medical treatment as before,  to f/u any worsening symptoms or concerns BP Readings from Last 3 Encounters:  06/07/17 126/76  06/05/17 122/68  04/24/17 116/78

## 2017-06-14 ENCOUNTER — Encounter: Payer: Medicare Other | Admitting: Internal Medicine

## 2017-07-02 ENCOUNTER — Telehealth: Payer: Self-pay | Admitting: Cardiology

## 2017-07-02 ENCOUNTER — Ambulatory Visit (INDEPENDENT_AMBULATORY_CARE_PROVIDER_SITE_OTHER): Payer: Medicare Other | Admitting: *Deleted

## 2017-07-02 DIAGNOSIS — I429 Cardiomyopathy, unspecified: Secondary | ICD-10-CM | POA: Diagnosis not present

## 2017-07-02 NOTE — Telephone Encounter (Signed)
LMOVM reminding pt to send remote transmission.   

## 2017-07-03 ENCOUNTER — Encounter: Payer: Self-pay | Admitting: Cardiology

## 2017-07-03 NOTE — Progress Notes (Signed)
Remote pacemaker transmission.   

## 2017-07-05 LAB — CUP PACEART REMOTE DEVICE CHECK
Battery Remaining Longevity: 68 mo
Battery Voltage: 3.01 V
Brady Statistic RA Percent Paced: 23.89 %
Date Time Interrogation Session: 20190424044719
Implantable Lead Implant Date: 20121112
Implantable Lead Location: 753858
Implantable Lead Model: 4194
Implantable Lead Model: 5076
Implantable Lead Model: 6947
Implantable Pulse Generator Implant Date: 20171114
Lead Channel Impedance Value: 399 Ohm
Lead Channel Impedance Value: 437 Ohm
Lead Channel Impedance Value: 456 Ohm
Lead Channel Impedance Value: 570 Ohm
Lead Channel Pacing Threshold Amplitude: 0.625 V
Lead Channel Sensing Intrinsic Amplitude: 3.5 mV
Lead Channel Sensing Intrinsic Amplitude: 9.375 mV
Lead Channel Sensing Intrinsic Amplitude: 9.375 mV
Lead Channel Setting Pacing Amplitude: 1.5 V
Lead Channel Setting Sensing Sensitivity: 2.8 mV
MDC IDC LEAD IMPLANT DT: 20121112
MDC IDC LEAD IMPLANT DT: 20121112
MDC IDC LEAD LOCATION: 753859
MDC IDC LEAD LOCATION: 753860
MDC IDC MSMT LEADCHNL LV IMPEDANCE VALUE: 532 Ohm
MDC IDC MSMT LEADCHNL LV IMPEDANCE VALUE: 684 Ohm
MDC IDC MSMT LEADCHNL LV IMPEDANCE VALUE: 817 Ohm
MDC IDC MSMT LEADCHNL RA IMPEDANCE VALUE: 361 Ohm
MDC IDC MSMT LEADCHNL RA PACING THRESHOLD AMPLITUDE: 0.5 V
MDC IDC MSMT LEADCHNL RA PACING THRESHOLD PULSEWIDTH: 0.4 ms
MDC IDC MSMT LEADCHNL RA SENSING INTR AMPL: 3.5 mV
MDC IDC MSMT LEADCHNL RV IMPEDANCE VALUE: 494 Ohm
MDC IDC MSMT LEADCHNL RV PACING THRESHOLD PULSEWIDTH: 0.4 ms
MDC IDC SET LEADCHNL LV PACING PULSEWIDTH: 1 ms
MDC IDC SET LEADCHNL RA PACING AMPLITUDE: 1.5 V
MDC IDC SET LEADCHNL RV PACING AMPLITUDE: 2 V
MDC IDC SET LEADCHNL RV PACING PULSEWIDTH: 0.4 ms
MDC IDC STAT BRADY AP VP PERCENT: 23.92 %
MDC IDC STAT BRADY AP VS PERCENT: 0 %
MDC IDC STAT BRADY AS VP PERCENT: 75.77 %
MDC IDC STAT BRADY AS VS PERCENT: 0.31 %
MDC IDC STAT BRADY RV PERCENT PACED: 99.62 %

## 2017-07-09 ENCOUNTER — Telehealth: Payer: Self-pay | Admitting: Cardiology

## 2017-07-09 NOTE — Telephone Encounter (Signed)
New Message   Pt c/o swelling: STAT is pt has developed SOB within 24 hours  1) How much weight have you gained and in what time span? 5.4 in 24 hours  2) If swelling, where is the swelling located?   3) Are you currently taking a fluid pill?   4) Are you currently SOB?   5) Do you have a log of your daily weights (if so, list)? 162.4-167.8 over night  6) Have you gained 3 pounds in a day or 5 pounds in a week? yes  7) Have you traveled recently?   Hung up before I could get more info, states just reporting the 24 hour weight gain

## 2017-07-09 NOTE — Telephone Encounter (Signed)
Left message for pt to call.

## 2017-07-11 NOTE — Telephone Encounter (Signed)
Per pt feels fine and made a call this am to [facility] re scales and tablet are not working properly.Per pt weighed at Dr Raphael Gibney and DrTaylor's  office and weight was same .Zack Seal

## 2017-09-20 ENCOUNTER — Encounter

## 2017-10-01 ENCOUNTER — Telehealth: Payer: Self-pay | Admitting: Cardiology

## 2017-10-01 ENCOUNTER — Ambulatory Visit (INDEPENDENT_AMBULATORY_CARE_PROVIDER_SITE_OTHER): Payer: Medicare Other | Admitting: *Deleted

## 2017-10-01 DIAGNOSIS — I429 Cardiomyopathy, unspecified: Secondary | ICD-10-CM | POA: Diagnosis not present

## 2017-10-01 NOTE — Progress Notes (Signed)
Remote pacemaker transmission.   

## 2017-10-01 NOTE — Telephone Encounter (Signed)
LMOVM reminding pt to send remote transmission.   

## 2017-10-26 LAB — CUP PACEART REMOTE DEVICE CHECK
Brady Statistic AP VS Percent: 0.01 %
Brady Statistic AS VP Percent: 72.32 %
Brady Statistic AS VS Percent: 0.18 %
Brady Statistic RA Percent Paced: 27.42 %
Brady Statistic RV Percent Paced: 99.66 %
Implantable Lead Implant Date: 20121112
Implantable Lead Implant Date: 20121112
Implantable Lead Location: 753860
Implantable Lead Model: 5076
Implantable Lead Model: 6947
Lead Channel Impedance Value: 380 Ohm
Lead Channel Impedance Value: 418 Ohm
Lead Channel Impedance Value: 437 Ohm
Lead Channel Impedance Value: 532 Ohm
Lead Channel Impedance Value: 684 Ohm
Lead Channel Impedance Value: 817 Ohm
Lead Channel Pacing Threshold Amplitude: 0.5 V
Lead Channel Pacing Threshold Amplitude: 0.625 V
Lead Channel Pacing Threshold Pulse Width: 0.4 ms
Lead Channel Sensing Intrinsic Amplitude: 3 mV
Lead Channel Setting Pacing Pulse Width: 1 ms
MDC IDC LEAD IMPLANT DT: 20121112
MDC IDC LEAD LOCATION: 753858
MDC IDC LEAD LOCATION: 753859
MDC IDC MSMT BATTERY REMAINING LONGEVITY: 63 mo
MDC IDC MSMT BATTERY VOLTAGE: 3.01 V
MDC IDC MSMT LEADCHNL LV IMPEDANCE VALUE: 570 Ohm
MDC IDC MSMT LEADCHNL RA IMPEDANCE VALUE: 361 Ohm
MDC IDC MSMT LEADCHNL RA SENSING INTR AMPL: 3 mV
MDC IDC MSMT LEADCHNL RV IMPEDANCE VALUE: 475 Ohm
MDC IDC MSMT LEADCHNL RV PACING THRESHOLD PULSEWIDTH: 0.4 ms
MDC IDC MSMT LEADCHNL RV SENSING INTR AMPL: 9.375 mV
MDC IDC MSMT LEADCHNL RV SENSING INTR AMPL: 9.375 mV
MDC IDC PG IMPLANT DT: 20171114
MDC IDC SESS DTM: 20190723231648
MDC IDC SET LEADCHNL LV PACING AMPLITUDE: 1.5 V
MDC IDC SET LEADCHNL RA PACING AMPLITUDE: 1.5 V
MDC IDC SET LEADCHNL RV PACING AMPLITUDE: 2 V
MDC IDC SET LEADCHNL RV PACING PULSEWIDTH: 0.4 ms
MDC IDC SET LEADCHNL RV SENSING SENSITIVITY: 2.8 mV
MDC IDC STAT BRADY AP VP PERCENT: 27.49 %

## 2017-12-10 ENCOUNTER — Encounter: Payer: Self-pay | Admitting: Internal Medicine

## 2017-12-10 ENCOUNTER — Ambulatory Visit (INDEPENDENT_AMBULATORY_CARE_PROVIDER_SITE_OTHER): Payer: Medicare Other | Admitting: Internal Medicine

## 2017-12-10 VITALS — BP 138/86 | HR 75 | Temp 97.9°F | Ht 65.0 in | Wt 166.0 lb

## 2017-12-10 DIAGNOSIS — Z0000 Encounter for general adult medical examination without abnormal findings: Secondary | ICD-10-CM

## 2017-12-10 DIAGNOSIS — R739 Hyperglycemia, unspecified: Secondary | ICD-10-CM

## 2017-12-10 DIAGNOSIS — I428 Other cardiomyopathies: Secondary | ICD-10-CM

## 2017-12-10 DIAGNOSIS — Z23 Encounter for immunization: Secondary | ICD-10-CM

## 2017-12-10 DIAGNOSIS — I5022 Chronic systolic (congestive) heart failure: Secondary | ICD-10-CM

## 2017-12-10 MED ORDER — FUROSEMIDE 40 MG PO TABS: 40.0000 mg | ORAL_TABLET | Freq: Every day | ORAL | 1 refills | Status: DC

## 2017-12-10 MED ORDER — FUROSEMIDE 40 MG PO TABS: 40.0000 mg | ORAL_TABLET | Freq: Every day | ORAL | 3 refills | Status: DC

## 2017-12-10 MED ORDER — TRIAMCINOLONE ACETONIDE 55 MCG/ACT NA AERO: 1.0000 | INHALATION_SPRAY | Freq: Every day | NASAL | 11 refills | Status: DC

## 2017-12-10 MED ORDER — SPIRONOLACTONE 50 MG PO TABS: 50.0000 mg | ORAL_TABLET | Freq: Every day | ORAL | 3 refills | Status: DC

## 2017-12-10 MED ORDER — ROSUVASTATIN CALCIUM 40 MG PO TABS: 40.0000 mg | ORAL_TABLET | Freq: Every day | ORAL | 3 refills | Status: DC

## 2017-12-10 MED ORDER — LISINOPRIL 20 MG PO TABS: 20.0000 mg | ORAL_TABLET | Freq: Every day | ORAL | 3 refills | Status: DC

## 2017-12-10 MED ORDER — ALBUTEROL SULFATE (5 MG/ML) 0.5% IN NEBU: 2.5000 mg | INHALATION_SOLUTION | Freq: Four times a day (QID) | RESPIRATORY_TRACT | 11 refills | Status: DC | PRN

## 2017-12-10 MED ORDER — TIOTROPIUM BROMIDE MONOHYDRATE 18 MCG IN CAPS: 18.0000 ug | ORAL_CAPSULE | RESPIRATORY_TRACT | 3 refills | Status: DC

## 2017-12-10 MED ORDER — LEVOTHYROXINE SODIUM 100 MCG PO TABS: 200.0000 ug | ORAL_TABLET | Freq: Every day | ORAL | 1 refills | Status: DC

## 2017-12-10 MED ORDER — CARVEDILOL 25 MG PO TABS: 25.0000 mg | ORAL_TABLET | Freq: Two times a day (BID) | ORAL | 3 refills | Status: DC

## 2017-12-10 NOTE — Assessment & Plan Note (Signed)

## 2017-12-10 NOTE — Assessment & Plan Note (Signed)
stable overall by history and exam, recent data reviewed with pt, and pt to continue medical treatment as before,  to f/u any worsening symptoms or concerns  

## 2017-12-10 NOTE — Progress Notes (Signed)
Subjective:    Patient ID: [name], female    DOB: 10-07-1950, 67 y.o.   MRN: [id number]  HPI  Here for wellness and f/u;  Overall doing ok;  Pt denies Chest pain, worsening SOB, DOE, wheezing, orthopnea, PND, worsening LE edema, palpitations, dizziness or syncope.  Pt denies neurological change such as new headache, facial or extremity weakness.  Pt denies polydipsia, polyuria, or low sugar symptoms. Pt states overall good compliance with treatment and medications, good tolerability, and has been trying to follow appropriate diet.  Pt denies worsening depressive symptoms, suicidal ideation or panic. No fever, night sweats, wt loss, loss of appetite, or other constitutional symptoms.  Pt states good ability with ADL's, has low fall risk, home safety reviewed and adequate, no other significant changes in hearing or vision, and only occasionally active with exercise., but plans to go back to [employer] soon.  Does a morning walk about 1.5 miles. Wt Readings from Last 3 Encounters:  12/10/17 166 lb (75.3 kg)  06/07/17 166 lb (75.3 kg)  06/05/17 170 lb (77.1 kg)   Past Medical History:  Diagnosis Date  . ALLERGIC RHINITIS 03/17/2008  . Anemia, iron deficiency   . Blood transfusion complicating pregnancy    after first child at 67 yo  . CAD (coronary artery disease)    Nonobstructive by cardiac catheter 8/12:EF 10-15%, circumflex calcified without obstructive CAD, proximal LAD 20%, mid LAD 20%, proximal RCA 20%, mid RCA 20%.  . Cardiomyopathy    a. remote h/o cath with reported nonobs CAD; b. echo 5/12: EF 15%, mild LVH, mild MR, LAE, mod pericardial effusion with increased filling pressures  . Cervical disc disease    with recurrent radicular pain, Dr. Montez Morita  . COPD (chronic obstructive pulmonary disease) (HCC) 06/14/2010  . DJD (degenerative joint disease)    hands  . Genital warts    hx  . GOITER, MULTINODULAR 12/23/2009  . Hyperlipidemia 11/23/2015  . Hypertension   .  Hypothyroid   . Myocardial infarction (HCC)   . Systolic CHF [facility])    Past Surgical History:  Procedure Laterality Date  . CARDIAC CATHETERIZATION  1995   with "timy blockage" [facility] Dr. Daisy Floro  . CARDIAC DEFIBRILLATOR PLACEMENT  01-22-11   by Dr.Taylor  . CHOLECYSTECTOMY    . EP IMPLANTABLE DEVICE N/A 01/24/2016   Procedure: ICD Generator Changeout;  Surgeon: Marinus Maw, MD;  Location: [facility] INVASIVE CV LAB;  Service: Cardiovascular;  Laterality: N/A;  . IMPLANTABLE CARDIOVERTER DEFIBRILLATOR IMPLANT N/A 01/22/2011   Procedure: IMPLANTABLE CARDIOVERTER DEFIBRILLATOR IMPLANT;  Surgeon: Marinus Maw, MD;  Location: [facility] CATH LAB;  Service: Cardiovascular;  Laterality: N/A;    reports that she has quit smoking. She smoked 0.25 packs per day. She has never used smokeless tobacco. She reports that she does not drink alcohol or use drugs. family history includes Alcohol abuse in her other; Cancer in her sister; Depression in her sister; Diabetes in her father, mother, and sister; Heart disease in her father, mother, and sister; Hypertension in her father, mother, and sister; Malignant hyperthermia in her other; Stroke in her father and mother; Thyroid disease in her sister; Transient ischemic attack in her maternal grandfather. Allergies  Allergen Reactions  . Ivp Dye [Iodinated Diagnostic Agents] Itching and Rash    rash, itching & peel (patient has a MILDER reaction when given w/benadryl)  . Latex Rash    Allergic to latex gloves  . Penicillins Itching, Swelling, Rash and Other (See  Comments)    Has patient had a PCN reaction causing immediate rash, facial/tongue/throat swelling, SOB or lightheadedness with hypotension:Yes Has patient had a PCN reaction causing severe rash involving mucus membranes or skin necrosis:Yes Has patient had a PCN reaction that required hospitalization:Pt. Was inpatient when reaction occurred Has patient had a PCN reaction occurring within the last 10  years: Yes If all of the above answers are "NO", then may proceed with Cephalosporin use.   . Strawberry Extract Swelling and Rash        Current Outpatient Medications on File Prior to Visit  Medication Sig Dispense Refill  . albuterol (PROVENTIL) (5 MG/ML) 0.5% nebulizer solution Take 0.5 mLs (2.5 mg total) by nebulization every 6 (six) hours as needed for wheezing or shortness of breath. 20 mL 11  . aspirin 81 MG EC tablet Take 81 mg by mouth daily.     . carvedilol (COREG) 25 MG tablet Take 1 tablet (25 mg total) by mouth 2 (two) times daily with a meal. 90 tablet 3  . Cholecalciferol (VITAMIN D3) 1000 UNITS CAPS Take 1,000 Units by mouth daily.     . fexofenadine (ALLEGRA) 180 MG tablet Take 180 mg by mouth daily.      . hydrocortisone cream 1 % Apply 1 application topically 3 (three) times daily as needed for itching.    Marland Kitchen lisinopril (PRINIVIL,ZESTRIL) 20 MG tablet Take 1 tablet (20 mg total) by mouth daily. 90 tablet 3  . rosuvastatin (CRESTOR) 40 MG tablet Take 1 tablet (40 mg total) by mouth daily. 90 tablet 3  . spironolactone (ALDACTONE) 50 MG tablet Take 1 tablet (50 mg total) by mouth daily. 90 tablet 3  . tiotropium (SPIRIVA) 18 MCG inhalation capsule Place 1 capsule (18 mcg total) into inhaler and inhale every morning. 90 capsule 3  . triamcinolone (NASACORT) 55 MCG/ACT AERO nasal inhaler Place 1 spray into the nose daily. 1 Inhaler 11   No current facility-administered medications on file prior to visit.    Review of Systems Constitutional: Negative for other unusual diaphoresis, sweats, appetite or weight changes HENT: Negative for other worsening hearing loss, ear pain, facial swelling, mouth sores or neck stiffness.   Eyes: Negative for other worsening pain, redness or other visual disturbance.  Respiratory: Negative for other stridor or swelling Cardiovascular: Negative for other palpitations or other chest pain  Gastrointestinal: Negative for worsening diarrhea or  loose stools, blood in stool, distention or other pain Genitourinary: Negative for hematuria, flank pain or other change in urine volume.  Musculoskeletal: Negative for myalgias or other joint swelling.  Skin: Negative for other color change, or other wound or worsening drainage.  Neurological: Negative for other syncope or numbness. Hematological: Negative for other adenopathy or swelling Psychiatric/Behavioral: Negative for hallucinations, other worsening agitation, SI, self-injury, or new decreased concentration All other system neg per pt    Objective:   Physical Exam BP 138/86   Pulse 75   Temp 97.9 F (36.6 C) (Oral)   Ht 5\' 5"  (1.651 m)   Wt 166 lb (75.3 kg)   SpO2 96%   BMI 27.62 kg/m  VS noted,  Constitutional: Pt is oriented to person, place, and time. Appears well-developed and well-nourished, in no significant distress and comfortable Head: Normocephalic and atraumatic  Eyes: Conjunctivae and EOM are normal. Pupils are equal, round, and reactive to light Right Ear: External ear normal without discharge Left Ear: External ear normal without discharge Nose: Nose without discharge or deformity Mouth/Throat: Oropharynx is  without other ulcerations and moist  Neck: Normal range of motion. Neck supple. No JVD present. No tracheal deviation present or significant neck LA or mass Cardiovascular: Normal rate, regular rhythm, normal heart sounds and intact distal pulses.   Pulmonary/Chest: WOB normal and breath sounds without rales or wheezing  Abdominal: Soft. Bowel sounds are normal. NT. No HSM  Musculoskeletal: Normal range of motion. Exhibits no edema Lymphadenopathy: Has no other cervical adenopathy.  Neurological: Pt is alert and oriented to person, place, and time. Pt has normal reflexes. No cranial nerve deficit. Motor grossly intact, Gait intact Skin: Skin is warm and dry. No rash noted or new ulcerations Psychiatric:  Has normal mood and affect. Behavior is normal  without agitation Lab Results  Component Value Date   WBC 3.9 (L) 12/04/2016   HGB 12.4 12/04/2016   HCT 38.3 12/04/2016   PLT 234.0 12/04/2016   GLUCOSE 92 04/24/2017   CHOL 174 04/24/2017   TRIG 218.0 (H) 04/24/2017   HDL 33.60 (L) 04/24/2017   LDLDIRECT 92.0 04/24/2017   LDLCALC 139 (H) 12/04/2016   ALT 14 04/24/2017   AST 15 04/24/2017   NA 138 04/24/2017   K 4.4 04/24/2017   CL 103 04/24/2017   CREATININE 1.15 04/24/2017   BUN 15 04/24/2017   CO2 30 04/24/2017   TSH 2.60 04/24/2017   INR 1.0 01/16/2011   HGBA1C 6.5 04/24/2017          Assessment & Plan:

## 2017-12-10 NOTE — Patient Instructions (Addendum)

## 2017-12-17 NOTE — Progress Notes (Addendum)
Subjective:   [name] is a 67 y.o. female who presents for Medicare Annual (Subsequent) preventive examination.  Review of Systems:  No ROS.  Medicare Wellness Visit. Additional risk factors are reflected in the social history.  Cardiac Risk Factors include: advanced age (>61men, >48 women);dyslipidemia;hypertension Sleep patterns: feels rested on waking, gets up 1 times nightly to void and sleeps 7-8 hours nightly.    Home Safety/Smoke Alarms: Feels safe in home. Smoke alarms in place.  Living environment; residence and Firearm Safety: 1-story house/ trailer, no firearms. Lives with daughter, no needs for DME, good support system Seat Belt Safety/Bike Helmet: Wears seat belt.     Objective:     Vitals: BP 123/74   Pulse 67   Resp 17   Ht 5\' 5"  (1.651 m)   Wt 164 lb (74.4 kg)   SpO2 99%   BMI 27.29 kg/m   Body mass index is 27.29 kg/m.  Advanced Directives 12/18/2017 12/18/2016 01/24/2016 01/22/2011 01/22/2011  Does Patient Have a Medical Advance Directive? Yes Yes Yes Patient has advance directive, copy not in chart Patient does not have advance directive  Type of Advance Directive Healthcare Power of [place];Living will Healthcare Power of [place];Living will - Healthcare Power of Attorney -  Copy of Healthcare Power of Attorney in Chart? No - copy requested No - copy requested No - copy requested Copy requested from family -  Pre-existing out of facility DNR order (yellow form or pink MOST form) - - - No -    Tobacco Social History   Tobacco Use  Smoking Status Former Smoker  . Packs/day: 0.25  Smokeless Tobacco Never Used  Tobacco Comment   very light, occasional stopped she states now     Counseling given: Not Answered Comment: very light, occasional stopped she states now  Past Medical History:  Diagnosis Date  . ALLERGIC RHINITIS 03/17/2008  . Anemia, iron deficiency   . Blood transfusion complicating pregnancy    after first child at 25 yo  .  CAD (coronary artery disease)    Nonobstructive by cardiac catheter 8/12:EF 10-15%, circumflex calcified without obstructive CAD, proximal LAD 20%, mid LAD 20%, proximal RCA 20%, mid RCA 20%.  . Cardiomyopathy    a. remote h/o cath with reported nonobs CAD; b. echo 5/12: EF 15%, mild LVH, mild MR, LAE, mod pericardial effusion with increased filling pressures  . Cervical disc disease    with recurrent radicular pain, Dr. Montez Morita  . COPD (chronic obstructive pulmonary disease) (HCC) 06/14/2010  . DJD (degenerative joint disease)    hands  . Genital warts    hx  . GOITER, MULTINODULAR 12/23/2009  . Hyperlipidemia 11/23/2015  . Hypertension   . Hypothyroid   . Myocardial infarction (HCC)   . Systolic CHF [facility])    Past Surgical History:  Procedure Laterality Date  . CARDIAC CATHETERIZATION  1995   with "timy blockage" [facility] Dr. Daisy Floro  . CARDIAC DEFIBRILLATOR PLACEMENT  01-22-11   by Dr.Taylor  . CHOLECYSTECTOMY    . EP IMPLANTABLE DEVICE N/A 01/24/2016   Procedure: ICD Generator Changeout;  Surgeon: Marinus Maw, MD;  Location: [facility] INVASIVE CV LAB;  Service: Cardiovascular;  Laterality: N/A;  . IMPLANTABLE CARDIOVERTER DEFIBRILLATOR IMPLANT N/A 01/22/2011   Procedure: IMPLANTABLE CARDIOVERTER DEFIBRILLATOR IMPLANT;  Surgeon: Marinus Maw, MD;  Location: [facility] CATH LAB;  Service: Cardiovascular;  Laterality: N/A;   Family History  Problem Relation Age of Onset  . Stroke Mother   . Heart  disease Mother   . Hypertension Mother   . Diabetes Mother   . Stroke Father   . Heart disease Father   . Hypertension Father   . Diabetes Father   . Cancer Sister        colon  . Heart disease Sister        pacemaker  . Hypertension Sister   . Depression Sister   . Diabetes Sister   . Thyroid disease Sister        uncertain type  . Transient ischemic attack Maternal Grandfather   . Alcohol abuse Other   . Malignant hyperthermia Other    Social History   Socioeconomic History    . Marital status: Divorced    Spouse name: Not on file  . Number of children: 3  . Years of education: Not on file  . Highest education level: Not on file  Occupational History    Employer: [employer]  Social Needs  . Financial resource strain: Not hard at all  . Food insecurity:    Worry: Never true    Inability: Never true  . Transportation needs:    Medical: No    Non-medical: No  Tobacco Use  . Smoking status: Former Smoker    Packs/day: 0.25  . Smokeless tobacco: Never Used  . Tobacco comment: very light, occasional stopped she states now  Substance and Sexual Activity  . Alcohol use: No    Alcohol/week: 0.0 standard drinks  . Drug use: No  . Sexual activity: Never  Lifestyle  . Physical activity:    Days per week: 5 days    Minutes per session: 50 min  . Stress: Not at all  Relationships  . Social connections:    Talks on phone: More than three times a week    Gets together: More than three times a week    Attends religious service: More than 4 times per year    Active member of club or organization: Yes    Attends meetings of clubs or organizations: More than 4 times per year    Relationship status: Divorced  Other Topics Concern  . Not on file  Social History Narrative  . Not on file    Outpatient Encounter Medications as of 12/18/2017  Medication Sig  . albuterol (PROVENTIL) (5 MG/ML) 0.5% nebulizer solution Take 0.5 mLs (2.5 mg total) by nebulization every 6 (six) hours as needed for wheezing or shortness of breath.  Marland Kitchen aspirin 81 MG EC tablet Take 81 mg by mouth daily.   . carvedilol (COREG) 25 MG tablet Take 1 tablet (25 mg total) by mouth 2 (two) times daily with a meal.  . Cholecalciferol (VITAMIN D3) 1000 UNITS CAPS Take 1,000 Units by mouth daily.   . fexofenadine (ALLEGRA) 180 MG tablet Take 180 mg by mouth daily.    . furosemide (LASIX) 40 MG tablet Take 1 tablet (40 mg total) by mouth daily.  . hydrocortisone cream 1 % Apply 1  application topically 3 (three) times daily as needed for itching.  . levothyroxine (SYNTHROID, LEVOTHROID) 100 MCG tablet Take 2 tablets (200 mcg total) by mouth daily.  Marland Kitchen lisinopril (PRINIVIL,ZESTRIL) 20 MG tablet Take 1 tablet (20 mg total) by mouth daily.  Marland Kitchen spironolactone (ALDACTONE) 50 MG tablet Take 1 tablet (50 mg total) by mouth daily.  Marland Kitchen tiotropium (SPIRIVA) 18 MCG inhalation capsule Place 1 capsule (18 mcg total) into inhaler and inhale every morning.  . triamcinolone (NASACORT) 55 MCG/ACT AERO nasal  inhaler Place 1 spray into the nose daily.  . [DISCONTINUED] rosuvastatin (CRESTOR) 40 MG tablet Take 1 tablet (40 mg total) by mouth daily. (Patient not taking: Reported on 12/18/2017)   No facility-administered encounter medications on file as of 12/18/2017.     Activities of Daily Living In your present state of health, do you have any difficulty performing the following activities: 12/18/2017 12/18/2016  Hearing? N N  Vision? N N  Difficulty concentrating or making decisions? N N  Walking or climbing stairs? N N  Dressing or bathing? N N  Doing errands, shopping? N N  Preparing Food and eating ? N N  Using the Toilet? N N  In the past six months, have you accidently leaked urine? N N  Do you have problems with loss of bowel control? N N  Managing your Medications? N N  Managing your Finances? N N  Housekeeping or managing your Housekeeping? N N  Some recent data might be hidden    Patient Care Team: Corwin Levins, MD as PCP - General Marinus Maw, MD as Consulting Physician (Cardiology) Olivia Canter, MD as Consulting Physician (Ophthalmology)    Assessment:   This is a routine wellness examination for Terrel. Physical assessment deferred to PCP.   Exercise Activities and Dietary recommendations Current Exercise Habits: Structured exercise class, Type of exercise: walking;treadmill;strength training/weights, Time (Minutes): 50, Frequency (Times/Week): 5,  Weekly Exercise (Minutes/Week): 250, Intensity: Mild, Exercise limited by: None identified  Diet (meal preparation, eat out, water intake, caffeinated beverages, dairy products, fruits and vegetables): in general, a "healthy" diet  , well balanced. eats a variety of fruits and vegetables daily, limits salt, fat/cholesterol, sugar,carbohydrates,caffeine, drinks 6-8 glasses of water daily.  Goals    . Maintain current health status     Continue to exercise, eat healthy, worship God, enjoy life, and family.    . Patient Stated     Continue to be active physically, socially, eat healthy, exercise. Help with community outreaches with my grandson. Love family and enjoy life.        Fall Risk Fall Risk  12/18/2017 12/10/2017 12/18/2016 12/04/2016 06/05/2016  Falls in the past year? No No No No No    Depression Screen PHQ 2/9 Scores 12/18/2017 12/10/2017 12/18/2016 12/04/2016  PHQ - 2 Score 0 0 0 0  PHQ- 9 Score - - 0 -     Cognitive Function MMSE - Mini Mental State Exam 12/18/2016  Orientation to time 5  Orientation to Place 5  Registration 3  Attention/ Calculation 5  Recall 2  Language- name 2 objects 2  Language- repeat 1  Language- follow 3 step command 3  Language- read & follow direction 1  Write a sentence 1  Copy design 1  Total score 29       Ad8 score reviewed for issues:  Issues making decisions: no  Less interest in hobbies / activities: no  Repeats questions, stories (family complaining): no  Trouble using ordinary gadgets (microwave, computer, phone):no  Forgets the month or year: no  Mismanaging finances: no  Remembering appts: no  Daily problems with thinking and/or memory: no Ad8 score is= 0  Immunization History  Administered Date(s) Administered  . Influenza Split 12/12/2011  . Influenza, High Dose Seasonal PF 12/04/2016, 12/10/2017  . Influenza,inj,Quad PF,6+ Mos 11/23/2015  . Pneumococcal Conjugate-13 06/02/2014  . Pneumococcal Polysaccharide-23  11/23/2015  . Td 11/15/2009   Screening Tests Health Maintenance  Topic Date Due  . COLONOSCOPY  10/15/1998  . TETANUS/TDAP  11/16/2019  . INFLUENZA VACCINE  Completed  . DEXA SCAN  Completed  . Hepatitis C Screening  Completed  . PNA vac Low Risk Adult  Completed      Plan:   Patient will bring a copy of her Cologuard results of which she states was sent through her insurance 05/30/17.  Continue doing brain stimulating activities (puzzles, reading, adult coloring books, staying active) to keep memory sharp.   Continue to eat heart healthy diet (full of fruits, vegetables, whole grains, lean protein, water--limit salt, fat, and sugar intake) and increase physical activity as tolerated.  I have personally reviewed and noted the following in the patient's chart:   . Medical and social history . Use of alcohol, tobacco or illicit drugs  . Current medications and supplements . Functional ability and status . Nutritional status . Physical activity . Advanced directives . List of other physicians . Vitals . Screenings to include cognitive, depression, and falls . Referrals and appointments  In addition, I have reviewed and discussed with patient certain preventive protocols, quality metrics, and best practice recommendations. A written personalized care plan for preventive services as well as general preventive health recommendations were provided to patient.     Wanda Plump, RN  12/18/2017    Medical screening examination/treatment/procedure(s) were performed by non-physician practitioner and as supervising physician I was immediately available for consultation/collaboration. I agree with above. Oliver Barre, MD

## 2017-12-18 ENCOUNTER — Ambulatory Visit (INDEPENDENT_AMBULATORY_CARE_PROVIDER_SITE_OTHER): Payer: Medicare Other | Admitting: *Deleted

## 2017-12-18 VITALS — BP 123/74 | HR 67 | Resp 17 | Ht 65.0 in | Wt 164.0 lb

## 2017-12-18 DIAGNOSIS — Z0000 Encounter for general adult medical examination without abnormal findings: Secondary | ICD-10-CM | POA: Diagnosis not present

## 2017-12-18 NOTE — Patient Instructions (Addendum)
Continue doing brain stimulating activities (puzzles, reading, adult coloring books, staying active) to keep memory sharp.   Continue to eat heart healthy diet (full of fruits, vegetables, whole grains, lean protein, water--limit salt, fat, and sugar intake) and increase physical activity as tolerated.   [name] , Thank you for taking time to come for your Medicare Wellness Visit. I appreciate your ongoing commitment to your health goals. Please review the following plan we discussed and let me know if I can assist you in the future.   These are the goals we discussed: Goals    . Maintain current health status     Continue to exercise, eat healthy, worship God, enjoy life, and family.    . Patient Stated     Continue to be active physically, socially, eat healthy, exercise. Help with community outreaches with my grandson. Love family and enjoy life.        This is a list of the screening recommended for you and due dates:  Health Maintenance  Topic Date Due  . Colon Cancer Screening  10/15/1998  . Tetanus Vaccine  11/16/2019  . Flu Shot  Completed  . DEXA scan (bone density measurement)  Completed  .  Hepatitis C: One time screening is recommended by Center for Disease Control  (CDC) for  adults born from [birth year] through 1967.   Completed  . Pneumonia vaccines  Completed   Health Maintenance, Female Adopting a healthy lifestyle and getting preventive care can go a long way to promote health and wellness. Talk with your health care provider about what schedule of regular examinations is right for you. This is a good chance for you to check in with your provider about disease prevention and staying healthy. In between checkups, there are plenty of things you can do on your own. Experts have done a lot of research about which lifestyle changes and preventive measures are most likely to keep you healthy. Ask your health care provider for more information. Weight and diet Eat a healthy  diet  Be sure to include plenty of vegetables, fruits, low-fat dairy products, and lean protein.  Do not eat a lot of foods high in solid fats, added sugars, or salt.  Get regular exercise. This is one of the most important things you can do for your health. ? Most adults should exercise for at least 150 minutes each week. The exercise should increase your heart rate and make you sweat (moderate-intensity exercise). ? Most adults should also do strengthening exercises at least twice a week. This is in addition to the moderate-intensity exercise.  Maintain a healthy weight  Body mass index (BMI) is a measurement that can be used to identify possible weight problems. It estimates body fat based on height and weight. Your health care provider can help determine your BMI and help you achieve or maintain a healthy weight.  For females 52 years of age and older: ? A BMI below 18.5 is considered underweight. ? A BMI of 18.5 to 24.9 is normal. ? A BMI of 25 to 29.9 is considered overweight. ? A BMI of 30 and above is considered obese.  Watch levels of cholesterol and blood lipids  You should start having your blood tested for lipids and cholesterol at 69 years of age, then have this test every 5 years.  You may need to have your cholesterol levels checked more often if: ? Your lipid or cholesterol levels are high. ? You are older than 69 years  of age. ? You are at high risk for heart disease.  Cancer screening Lung Cancer  Lung cancer screening is recommended for adults 107-41 years old who are at high risk for lung cancer because of a history of smoking.  A yearly low-dose CT scan of the lungs is recommended for people who: ? Currently smoke. ? Have quit within the past 15 years. ? Have at least a 30-pack-year history of smoking. A pack year is smoking an average of one pack of cigarettes a day for 1 year.  Yearly screening should continue until it has been 15 years since you  quit.  Yearly screening should stop if you develop a health problem that would prevent you from having lung cancer treatment.  Breast Cancer  Practice breast self-awareness. This means understanding how your breasts normally appear and feel.  It also means doing regular breast self-exams. Let your health care provider know about any changes, no matter how small.  If you are in your 20s or 30s, you should have a clinical breast exam (CBE) by a health care provider every 1-3 years as part of a regular health exam.  If you are 27 or older, have a CBE every year. Also consider having a breast X-ray (mammogram) every year.  If you have a family history of breast cancer, talk to your health care provider about genetic screening.  If you are at high risk for breast cancer, talk to your health care provider about having an MRI and a mammogram every year.  Breast cancer gene (BRCA) assessment is recommended for women who have family members with BRCA-related cancers. BRCA-related cancers include: ? Breast. ? Ovarian. ? Tubal. ? Peritoneal cancers.  Results of the assessment will determine the need for genetic counseling and BRCA1 and BRCA2 testing.  Cervical Cancer Your health care provider may recommend that you be screened regularly for cancer of the pelvic organs (ovaries, uterus, and vagina). This screening involves a pelvic examination, including checking for microscopic changes to the surface of your cervix (Pap test). You may be encouraged to have this screening done every 3 years, beginning at age 38.  For women ages 64-65, health care providers may recommend pelvic exams and Pap testing every 3 years, or they may recommend the Pap and pelvic exam, combined with testing for human papilloma virus (HPV), every 5 years. Some types of HPV increase your risk of cervical cancer. Testing for HPV may also be done on women of any age with unclear Pap test results.  Other health care providers  may not recommend any screening for nonpregnant women who are considered low risk for pelvic cancer and who do not have symptoms. Ask your health care provider if a screening pelvic exam is right for you.  If you have had past treatment for cervical cancer or a condition that could lead to cancer, you need Pap tests and screening for cancer for at least 20 years after your treatment. If Pap tests have been discontinued, your risk factors (such as having a new sexual partner) need to be reassessed to determine if screening should resume. Some women have medical problems that increase the chance of getting cervical cancer. In these cases, your health care provider may recommend more frequent screening and Pap tests.  Colorectal Cancer  This type of cancer can be detected and often prevented.  Routine colorectal cancer screening usually begins at 69 years of age and continues through 69 years of age.  Your health care provider  may recommend screening at an earlier age if you have risk factors for colon cancer.  Your health care provider may also recommend using home test kits to check for hidden blood in the stool.  A small camera at the end of a tube can be used to examine your colon directly (sigmoidoscopy or colonoscopy). This is done to check for the earliest forms of colorectal cancer.  Routine screening usually begins at age 3.  Direct examination of the colon should be repeated every 5-10 years through 69 years of age. However, you may need to be screened more often if early forms of precancerous polyps or small growths are found.  Skin Cancer  Check your skin from head to toe regularly.  Tell your health care provider about any new moles or changes in moles, especially if there is a change in a mole's shape or color.  Also tell your health care provider if you have a mole that is larger than the size of a pencil eraser.  Always use sunscreen. Apply sunscreen liberally and repeatedly  throughout the day.  Protect yourself by wearing long sleeves, pants, a wide-brimmed hat, and sunglasses whenever you are outside.  Heart disease, diabetes, and high blood pressure  High blood pressure causes heart disease and increases the risk of stroke. High blood pressure is more likely to develop in: ? People who have blood pressure in the high end of the normal range (130-139/85-89 mm Hg). ? People who are overweight or obese. ? People who are African American.  If you are 57-12 years of age, have your blood pressure checked every 3-5 years. If you are 64 years of age or older, have your blood pressure checked every year. You should have your blood pressure measured twice-once when you are at a hospital or clinic, and once when you are not at a hospital or clinic. Record the average of the two measurements. To check your blood pressure when you are not at a hospital or clinic, you can use: ? An automated blood pressure machine at a pharmacy. ? A home blood pressure monitor.  If you are between 14 years and 47 years old, ask your health care provider if you should take aspirin to prevent strokes.  Have regular diabetes screenings. This involves taking a blood sample to check your fasting blood sugar level. ? If you are at a normal weight and have a low risk for diabetes, have this test once every three years after 69 years of age. ? If you are overweight and have a high risk for diabetes, consider being tested at a younger age or more often. Preventing infection Hepatitis B  If you have a higher risk for hepatitis B, you should be screened for this virus. You are considered at high risk for hepatitis B if: ? You were born in a country where hepatitis B is common. Ask your health care provider which countries are considered high risk. ? Your parents were born in a high-risk country, and you have not been immunized against hepatitis B (hepatitis B vaccine). ? You have HIV or AIDS. ? You  use needles to inject street drugs. ? You live with someone who has hepatitis B. ? You have had sex with someone who has hepatitis B. ? You get hemodialysis treatment. ? You take certain medicines for conditions, including cancer, organ transplantation, and autoimmune conditions.  Hepatitis C  Blood testing is recommended for: ? Everyone born from [birth year] through 1967. ? Anyone with  known risk factors for hepatitis C.  Sexually transmitted infections (STIs)  You should be screened for sexually transmitted infections (STIs) including gonorrhea and chlamydia if: ? You are sexually active and are younger than 69 years of age. ? You are older than 69 years of age and your health care provider tells you that you are at risk for this type of infection. ? Your sexual activity has changed since you were last screened and you are at an increased risk for chlamydia or gonorrhea. Ask your health care provider if you are at risk.  If you do not have HIV, but are at risk, it may be recommended that you take a prescription medicine daily to prevent HIV infection. This is called pre-exposure prophylaxis (PrEP). You are considered at risk if: ? You are sexually active and do not regularly use condoms or know the HIV status of your partner(s). ? You take drugs by injection. ? You are sexually active with a partner who has HIV.  Talk with your health care provider about whether you are at high risk of being infected with HIV. If you choose to begin PrEP, you should first be tested for HIV. You should then be tested every 3 months for as long as you are taking PrEP. Pregnancy  If you are premenopausal and you may become pregnant, ask your health care provider about preconception counseling.  If you may become pregnant, take 400 to 800 micrograms (mcg) of folic acid every day.  If you want to prevent pregnancy, talk to your health care provider about birth control (contraception). Osteoporosis and  menopause  Osteoporosis is a disease in which the bones lose minerals and strength with aging. This can result in serious bone fractures. Your risk for osteoporosis can be identified using a bone density scan.  If you are 55 years of age or older, or if you are at risk for osteoporosis and fractures, ask your health care provider if you should be screened.  Ask your health care provider whether you should take a calcium or vitamin D supplement to lower your risk for osteoporosis.  Menopause may have certain physical symptoms and risks.  Hormone replacement therapy may reduce some of these symptoms and risks. Talk to your health care provider about whether hormone replacement therapy is right for you. Follow these instructions at home:  Schedule regular health, dental, and eye exams.  Stay current with your immunizations.  Do not use any tobacco products including cigarettes, chewing tobacco, or electronic cigarettes.  If you are pregnant, do not drink alcohol.  If you are breastfeeding, limit how much and how often you drink alcohol.  Limit alcohol intake to no more than 1 drink per day for nonpregnant women. One drink equals 12 ounces of beer, 5 ounces of wine, or 1 ounces of hard liquor.  Do not use street drugs.  Do not share needles.  Ask your health care provider for help if you need support or information about quitting drugs.  Tell your health care provider if you often feel depressed.  Tell your health care provider if you have ever been abused or do not feel safe at home. This information is not intended to replace advice given to you by your health care provider. Make sure you discuss any questions you have with your health care provider. Document Released: 09/11/2010 Document Revised: 08/04/2015 Document Reviewed: 11/30/2014 Elsevier Interactive Patient Education  [employer].

## 2017-12-31 ENCOUNTER — Telehealth: Payer: Self-pay | Admitting: Cardiology

## 2017-12-31 ENCOUNTER — Ambulatory Visit (INDEPENDENT_AMBULATORY_CARE_PROVIDER_SITE_OTHER): Payer: Medicare Other | Admitting: *Deleted

## 2017-12-31 DIAGNOSIS — I429 Cardiomyopathy, unspecified: Secondary | ICD-10-CM

## 2017-12-31 NOTE — Telephone Encounter (Signed)
LMOVM reminding pt to send remote transmission.   

## 2017-12-31 NOTE — Progress Notes (Signed)
Remote pacemaker transmission.   

## 2018-01-01 ENCOUNTER — Encounter: Payer: Self-pay | Admitting: Cardiology

## 2018-01-22 LAB — CUP PACEART REMOTE DEVICE CHECK
Battery Remaining Longevity: 63 mo
Battery Voltage: 3.01 V
Brady Statistic RA Percent Paced: 27.15 %
Implantable Lead Implant Date: 20121112
Implantable Lead Location: 753858
Implantable Lead Model: 4194
Implantable Lead Model: 5076
Implantable Pulse Generator Implant Date: 20171114
Lead Channel Impedance Value: 399 Ohm
Lead Channel Impedance Value: 437 Ohm
Lead Channel Impedance Value: 437 Ohm
Lead Channel Pacing Threshold Amplitude: 0.75 V
Lead Channel Sensing Intrinsic Amplitude: 11.5 mV
Lead Channel Sensing Intrinsic Amplitude: 11.5 mV
Lead Channel Sensing Intrinsic Amplitude: 3.625 mV
Lead Channel Setting Sensing Sensitivity: 2.8 mV
MDC IDC LEAD IMPLANT DT: 20121112
MDC IDC LEAD IMPLANT DT: 20121112
MDC IDC LEAD LOCATION: 753859
MDC IDC LEAD LOCATION: 753860
MDC IDC MSMT LEADCHNL LV IMPEDANCE VALUE: 551 Ohm
MDC IDC MSMT LEADCHNL LV IMPEDANCE VALUE: 608 Ohm
MDC IDC MSMT LEADCHNL LV IMPEDANCE VALUE: 722 Ohm
MDC IDC MSMT LEADCHNL LV IMPEDANCE VALUE: 874 Ohm
MDC IDC MSMT LEADCHNL RA IMPEDANCE VALUE: 361 Ohm
MDC IDC MSMT LEADCHNL RA PACING THRESHOLD AMPLITUDE: 0.5 V
MDC IDC MSMT LEADCHNL RA PACING THRESHOLD PULSEWIDTH: 0.4 ms
MDC IDC MSMT LEADCHNL RA SENSING INTR AMPL: 3.625 mV
MDC IDC MSMT LEADCHNL RV IMPEDANCE VALUE: 494 Ohm
MDC IDC MSMT LEADCHNL RV PACING THRESHOLD PULSEWIDTH: 0.4 ms
MDC IDC SESS DTM: 20191022230339
MDC IDC SET LEADCHNL LV PACING AMPLITUDE: 1.5 V
MDC IDC SET LEADCHNL LV PACING PULSEWIDTH: 1 ms
MDC IDC SET LEADCHNL RA PACING AMPLITUDE: 1.5 V
MDC IDC SET LEADCHNL RV PACING AMPLITUDE: 2 V
MDC IDC SET LEADCHNL RV PACING PULSEWIDTH: 0.4 ms
MDC IDC STAT BRADY AP VP PERCENT: 27.24 %
MDC IDC STAT BRADY AP VS PERCENT: 0.01 %
MDC IDC STAT BRADY AS VP PERCENT: 72.54 %
MDC IDC STAT BRADY AS VS PERCENT: 0.2 %
MDC IDC STAT BRADY RV PERCENT PACED: 99.56 %

## 2018-04-04 ENCOUNTER — Telehealth: Payer: Self-pay

## 2018-04-04 ENCOUNTER — Ambulatory Visit (INDEPENDENT_AMBULATORY_CARE_PROVIDER_SITE_OTHER): Payer: Medicare Other

## 2018-04-04 DIAGNOSIS — I429 Cardiomyopathy, unspecified: Secondary | ICD-10-CM

## 2018-04-04 NOTE — Telephone Encounter (Signed)
Left message for patient to remind of missed remote transmission.  

## 2018-04-05 LAB — CUP PACEART REMOTE DEVICE CHECK
Battery Remaining Longevity: 62 mo
Battery Voltage: 3.01 V
Brady Statistic AP VS Percent: 0 %
Brady Statistic RA Percent Paced: 21.89 %
Brady Statistic RV Percent Paced: 98.66 %
Implantable Lead Implant Date: 20121112
Implantable Lead Implant Date: 20121112
Implantable Lead Location: 753858
Implantable Lead Model: 4194
Implantable Lead Model: 5076
Implantable Pulse Generator Implant Date: 20171114
Lead Channel Impedance Value: 399 Ohm
Lead Channel Impedance Value: 418 Ohm
Lead Channel Impedance Value: 494 Ohm
Lead Channel Impedance Value: 532 Ohm
Lead Channel Impedance Value: 608 Ohm
Lead Channel Impedance Value: 741 Ohm
Lead Channel Pacing Threshold Amplitude: 0.5 V
Lead Channel Pacing Threshold Amplitude: 0.75 V
Lead Channel Pacing Threshold Pulse Width: 0.4 ms
Lead Channel Sensing Intrinsic Amplitude: 10.375 mV
Lead Channel Sensing Intrinsic Amplitude: 10.375 mV
Lead Channel Setting Pacing Amplitude: 1.5 V
Lead Channel Setting Pacing Amplitude: 1.5 V
Lead Channel Setting Pacing Pulse Width: 1 ms
MDC IDC LEAD IMPLANT DT: 20121112
MDC IDC LEAD LOCATION: 753859
MDC IDC LEAD LOCATION: 753860
MDC IDC MSMT LEADCHNL LV IMPEDANCE VALUE: 475 Ohm
MDC IDC MSMT LEADCHNL LV IMPEDANCE VALUE: 570 Ohm
MDC IDC MSMT LEADCHNL LV IMPEDANCE VALUE: 893 Ohm
MDC IDC MSMT LEADCHNL RA PACING THRESHOLD PULSEWIDTH: 0.4 ms
MDC IDC MSMT LEADCHNL RA SENSING INTR AMPL: 4.375 mV
MDC IDC MSMT LEADCHNL RA SENSING INTR AMPL: 4.375 mV
MDC IDC SESS DTM: 20200125011645
MDC IDC SET LEADCHNL RV PACING AMPLITUDE: 2 V
MDC IDC SET LEADCHNL RV PACING PULSEWIDTH: 0.4 ms
MDC IDC SET LEADCHNL RV SENSING SENSITIVITY: 2.8 mV
MDC IDC STAT BRADY AP VP PERCENT: 22.04 %
MDC IDC STAT BRADY AS VP PERCENT: 77.06 %
MDC IDC STAT BRADY AS VS PERCENT: 0.9 %

## 2018-04-07 NOTE — Progress Notes (Signed)
Remote pacemaker transmission.   

## 2018-04-08 ENCOUNTER — Telehealth: Payer: Self-pay | Admitting: Internal Medicine

## 2018-04-08 NOTE — Telephone Encounter (Signed)
Needs OV if not already followed by vascular surgury

## 2018-04-08 NOTE — Telephone Encounter (Signed)
[facility] NP with [employer] called to reports she performed a PAD screening: Left leg 0.26, and Right leg 0.89.

## 2018-04-08 NOTE — Telephone Encounter (Signed)
Pt has been informed and scheduled an appt.

## 2018-04-08 NOTE — Telephone Encounter (Signed)
Please advise 

## 2018-04-10 ENCOUNTER — Encounter: Payer: Self-pay | Admitting: Internal Medicine

## 2018-04-10 ENCOUNTER — Ambulatory Visit (INDEPENDENT_AMBULATORY_CARE_PROVIDER_SITE_OTHER): Payer: Medicare Other | Admitting: Internal Medicine

## 2018-04-10 ENCOUNTER — Other Ambulatory Visit (INDEPENDENT_AMBULATORY_CARE_PROVIDER_SITE_OTHER): Payer: Medicare Other

## 2018-04-10 VITALS — BP 114/62 | HR 65 | Temp 98.3°F | Ht 65.0 in | Wt 167.0 lb

## 2018-04-10 DIAGNOSIS — E785 Hyperlipidemia, unspecified: Secondary | ICD-10-CM

## 2018-04-10 DIAGNOSIS — I2510 Atherosclerotic heart disease of native coronary artery without angina pectoris: Secondary | ICD-10-CM | POA: Diagnosis not present

## 2018-04-10 DIAGNOSIS — R739 Hyperglycemia, unspecified: Secondary | ICD-10-CM

## 2018-04-10 DIAGNOSIS — E039 Hypothyroidism, unspecified: Secondary | ICD-10-CM | POA: Diagnosis not present

## 2018-04-10 LAB — LIPID PANEL
Cholesterol: 179 mg/dL (ref 0–200)
HDL: 34.8 mg/dL — AB (ref >39.00)
LDL Cholesterol: 113 mg/dL — ABNORMAL HIGH (ref 0–99)
NonHDL: 144.17
Total CHOL/HDL Ratio: 5
Triglycerides: 157 mg/dL — ABNORMAL HIGH (ref 0.0–149.0)
VLDL: 31.4 mg/dL (ref 0.0–40.0)

## 2018-04-10 LAB — CBC WITH DIFFERENTIAL/PLATELET
BASOS ABS: 0 10*3/uL (ref 0.0–0.1)
Basophils Relative: 0.3 % (ref 0.0–3.0)
Eosinophils Absolute: 0.2 10*3/uL (ref 0.0–0.7)
Eosinophils Relative: 3.3 % (ref 0.0–5.0)
HCT: 39.3 % (ref 36.0–46.0)
Hemoglobin: 13 g/dL (ref 12.0–15.0)
Lymphocytes Relative: 30.7 % (ref 12.0–46.0)
Lymphs Abs: 1.4 10*3/uL (ref 0.7–4.0)
MCHC: 33.2 g/dL (ref 30.0–36.0)
MCV: 84.2 fl (ref 78.0–100.0)
Monocytes Absolute: 0.3 10*3/uL (ref 0.1–1.0)
Monocytes Relative: 7.2 % (ref 3.0–12.0)
Neutro Abs: 2.8 10*3/uL (ref 1.4–7.7)
Neutrophils Relative %: 58.5 % (ref 43.0–77.0)
Platelets: 231 10*3/uL (ref 150.0–400.0)
RBC: 4.66 Mil/uL (ref 3.87–5.11)
RDW: 14.8 % (ref 11.5–15.5)
WBC: 4.7 10*3/uL (ref 4.0–10.5)

## 2018-04-10 LAB — URINALYSIS, ROUTINE W REFLEX MICROSCOPIC
Bilirubin Urine: NEGATIVE
Ketones, ur: NEGATIVE
Leukocytes, UA: NEGATIVE
Nitrite: NEGATIVE
Specific Gravity, Urine: 1.025 (ref 1.000–1.030)
Total Protein, Urine: NEGATIVE
Urine Glucose: NEGATIVE
Urobilinogen, UA: 0.2 (ref 0.0–1.0)
pH: 6 (ref 5.0–8.0)

## 2018-04-10 LAB — BASIC METABOLIC PANEL
BUN: 15 mg/dL (ref 6–23)
CO2: 29 meq/L (ref 19–32)
CREATININE: 1.26 mg/dL — AB (ref 0.40–1.20)
Calcium: 9.9 mg/dL (ref 8.4–10.5)
Chloride: 104 mEq/L (ref 96–112)
GFR: 50.87 mL/min — ABNORMAL LOW (ref >60.00)
Glucose, Bld: 103 mg/dL — ABNORMAL HIGH (ref 70–99)
Potassium: 4.5 mEq/L (ref 3.5–5.1)
Sodium: 140 mEq/L (ref 135–145)

## 2018-04-10 LAB — HEPATIC FUNCTION PANEL
ALT: 10 U/L (ref 0–35)
AST: 13 U/L (ref 0–37)
Albumin: 4.1 g/dL (ref 3.5–5.2)
Alkaline Phosphatase: 106 U/L (ref 39–117)
Bilirubin, Direct: 0.1 mg/dL (ref 0.0–0.3)
Total Bilirubin: 0.3 mg/dL (ref 0.2–1.2)
Total Protein: 7.3 g/dL (ref 6.0–8.3)

## 2018-04-10 LAB — HEMOGLOBIN A1C: Hgb A1c MFr Bld: 6.3 % (ref 4.6–6.5)

## 2018-04-10 LAB — TSH: TSH: 17.83 u[IU]/mL — ABNORMAL HIGH (ref 0.35–4.50)

## 2018-04-10 MED ORDER — FUROSEMIDE 40 MG PO TABS: 40.0000 mg | ORAL_TABLET | Freq: Every day | ORAL | 1 refills | Status: DC

## 2018-04-10 NOTE — Progress Notes (Signed)
Subjective:    Patient ID: [name], female    DOB: [birth year], 68 y.o.   MRN: [id number]  HPI  Here to f/u, did not have labs done last visit, but wants today.  Here to f/u; overall doing ok,  Pt denies chest pain, increasing sob or doe, wheezing, orthopnea, PND, increased LE swelling, palpitations, dizziness or syncope.  Pt denies new neurological symptoms such as new headache, or facial or extremity weakness or numbness.  Pt denies polydipsia, polyuria, or low sugar episode.  Pt states overall good compliance with meds, mostly trying to follow appropriate diet, with wt overall stable,  but little exercise however. Has cologuard like testing pending, declines colonoscopy.    No new complaints   Denies hyper or hypo thyroid symptoms such as voice, skin or hair change. Past Medical History:  Diagnosis Date  . ALLERGIC RHINITIS 03/17/2008  . Anemia, iron deficiency   . Blood transfusion complicating pregnancy    after first child at 62 yo  . CAD (coronary artery disease)    Nonobstructive by cardiac catheter 8/12:EF 10-15%, circumflex calcified without obstructive CAD, proximal LAD 20%, mid LAD 20%, proximal RCA 20%, mid RCA 20%.  . Cardiomyopathy    a. remote h/o cath with reported nonobs CAD; b. echo 5/12: EF 15%, mild LVH, mild MR, LAE, mod pericardial effusion with increased filling pressures  . Cervical disc disease    with recurrent radicular pain, Dr. Montez Morita  . COPD (chronic obstructive pulmonary disease) (HCC) 06/14/2010  . DJD (degenerative joint disease)    hands  . Genital warts    hx  . GOITER, MULTINODULAR 12/23/2009  . Hyperlipidemia 11/23/2015  . Hypertension   . Hypothyroid   . Myocardial infarction (HCC)   . Systolic [facility]F [facility])    Past Surgical History:  Procedure Laterality Date  . CARDIAC CATHETERIZATION  1995   with "timy blockage" [facility] Dr. Daisy Floro  . CARDIAC DEFIBRILLATOR PLACEMENT  01-22-11   by Dr.Taylor  . CHOLECYSTECTOMY    . EP IMPLANTABLE  DEVICE N/A 01/24/2016   Procedure: ICD Generator Changeout;  Surgeon: Marinus Maw, MD;  Location: [facility] INVASIVE CV LAB;  Service: Cardiovascular;  Laterality: N/A;  . IMPLANTABLE CARDIOVERTER DEFIBRILLATOR IMPLANT N/A 01/22/2011   Procedure: IMPLANTABLE CARDIOVERTER DEFIBRILLATOR IMPLANT;  Surgeon: Marinus Maw, MD;  Location: [facility] CATH LAB;  Service: Cardiovascular;  Laterality: N/A;    reports that she has quit smoking. She smoked 0.25 packs per day. She has never used smokeless tobacco. She reports that she does not drink alcohol or use drugs. family history includes Alcohol abuse in an other family member; Cancer in her sister; Depression in her sister; Diabetes in her father, mother, and sister; Heart disease in her father, mother, and sister; Hypertension in her father, mother, and sister; Malignant hyperthermia in an other family member; Stroke in her father and mother; Thyroid disease in her sister; Transient ischemic attack in her maternal grandfather. Allergies  Allergen Reactions  . Ivp Dye [Iodinated Diagnostic Agents] Itching and Rash    rash, itching & peel (patient has a MILDER reaction when given w/benadryl)  . Latex Rash    Allergic to latex gloves  . Penicillins Itching, Swelling, Rash and Other (See Comments)    Has patient had a PCN reaction causing immediate rash, facial/tongue/throat swelling, SOB or lightheadedness with hypotension:Yes Has patient had a PCN reaction causing severe rash involving mucus membranes or skin necrosis:Yes Has patient had a PCN reaction that required hospitalization:Pt.  Was inpatient when reaction occurred Has patient had a PCN reaction occurring within the last 10 years: Yes If all of the above answers are "NO", then may proceed with Cephalosporin use.   . Strawberry Extract Swelling and Rash        Current Outpatient Medications on File Prior to Visit  Medication Sig Dispense Refill  . albuterol (PROVENTIL) (5 MG/ML) 0.5% nebulizer  solution Take 0.5 mLs (2.5 mg total) by nebulization every 6 (six) hours as needed for wheezing or shortness of breath. 20 mL 11  . aspirin 81 MG EC tablet Take 81 mg by mouth daily.     . carvedilol (COREG) 25 MG tablet Take 1 tablet (25 mg total) by mouth 2 (two) times daily with a meal. 90 tablet 3  . Cholecalciferol (VITAMIN D3) 1000 UNITS CAPS Take 1,000 Units by mouth daily.     . fexofenadine (ALLEGRA) 180 MG tablet Take 180 mg by mouth daily.      . hydrocortisone cream 1 % Apply 1 application topically 3 (three) times daily as needed for itching.    . levothyroxine (SYNTHROID, LEVOTHROID) 100 MCG tablet Take 2 tablets (200 mcg total) by mouth daily. 180 tablet 1  . lisinopril (PRINIVIL,ZESTRIL) 20 MG tablet Take 1 tablet (20 mg total) by mouth daily. 90 tablet 3  . spironolactone (ALDACTONE) 50 MG tablet Take 1 tablet (50 mg total) by mouth daily. 90 tablet 3  . tiotropium (SPIRIVA) 18 MCG inhalation capsule Place 1 capsule (18 mcg total) into inhaler and inhale every morning. 90 capsule 3  . triamcinolone (NASACORT) 55 MCG/ACT AERO nasal inhaler Place 1 spray into the nose daily. 1 Inhaler 11   No current facility-administered medications on file prior to visit.    Review of Systems  Constitutional: Negative for other unusual diaphoresis or sweats HENT: Negative for ear discharge or swelling Eyes: Negative for other worsening visual disturbances Respiratory: Negative for stridor or other swelling  Gastrointestinal: Negative for worsening distension or other blood Genitourinary: Negative for retention or other urinary change Musculoskeletal: Negative for other MSK pain or swelling Skin: Negative for color change or other new lesions Neurological: Negative for worsening tremors and other numbness  Psychiatric/Behavioral: Negative for worsening agitation or other fatigue All other system neg per pt    Objective:   Physical Exam BP 114/62   Pulse 65   Temp 98.3 F (36.8 C)  (Oral)   Ht 5\' 5"  (1.651 m)   Wt 167 lb (75.8 kg)   SpO2 95%   BMI 27.79 kg/m  VS noted,  Constitutional: Pt appears in NAD HENT: Head: NCAT.  Right Ear: External ear normal.  Left Ear: External ear normal.  Eyes: . Pupils are equal, round, and reactive to light. Conjunctivae and EOM are normal Nose: without d/c or deformity Neck: Neck supple. Gross normal ROM Cardiovascular: Normal rate and regular rhythm.   Pulmonary/Chest: Effort normal and breath sounds without rales or wheezing.  Abd:  Soft, NT, ND, + BS, no organomegaly Neurological: Pt is alert. At baseline orientation, motor grossly intact Skin: Skin is warm. No rashes, other new lesions, no LE edema Psychiatric: Pt behavior is normal without agitation  No other exam findings Lab Results  Component Value Date   WBC 3.9 (L) 12/04/2016   HGB 12.4 12/04/2016   HCT 38.3 12/04/2016   PLT 234.0 12/04/2016   GLUCOSE 92 04/24/2017   CHOL 174 04/24/2017   TRIG 218.0 (H) 04/24/2017   HDL 33.60 (L) 04/24/2017  LDLDIRECT 92.0 04/24/2017   LDLCALC 139 (H) 12/04/2016   ALT 14 04/24/2017   AST 15 04/24/2017   NA 138 04/24/2017   K 4.4 04/24/2017   CL 103 04/24/2017   CREATININE 1.15 04/24/2017   BUN 15 04/24/2017   CO2 30 04/24/2017   TSH 2.60 04/24/2017   INR 1.0 01/16/2011   HGBA1C 6.5 04/24/2017       Assessment & Plan:

## 2018-04-10 NOTE — Patient Instructions (Addendum)
Please continue all other medications as before, and refills have been done if requested.  Please have the pharmacy call with any other refills you may need.  Please continue your efforts at being more active, low cholesterol diet, and weight control.  You are otherwise up to date with prevention measures today.  Please keep your appointments with your specialists as you may have planned  Please go to the LAB in the Basement (turn left off the elevator) for the tests to be done today  You will be contacted by phone if any changes need to be made immediately.  Otherwise, you will receive a letter about your results with an explanation, but please check with MyChart first.  Please remember to sign up for MyChart if you have not done so, as this will be important to you in the future with finding out test results, communicating by private email, and scheduling acute appointments online when needed.  Please return in 6 months, or sooner if needed  OK to cancel the April 2 appointment

## 2018-04-10 NOTE — Assessment & Plan Note (Signed)
stable overall by history and exam, recent data reviewed with pt, and pt to continue medical treatment as before,  to f/u any worsening symptoms or concerns, for f/u a1c 

## 2018-04-10 NOTE — Assessment & Plan Note (Signed)
stable overall by history and exam, recent data reviewed with pt, and pt to continue medical treatment as before,  to f/u any worsening symptoms or concerns  

## 2018-04-10 NOTE — Assessment & Plan Note (Signed)
stable overall by history and exam, recent data reviewed with pt, and pt to continue medical treatment as before,  to f/u any worsening symptoms or concerns, for TSH

## 2018-04-10 NOTE — Assessment & Plan Note (Signed)
stable overall by history and exam, recent data reviewed with pt, and pt to continue medical treatment as before,  to f/u any worsening symptoms or concerns, for f/u labs 

## 2018-04-11 ENCOUNTER — Other Ambulatory Visit: Payer: Self-pay | Admitting: Internal Medicine

## 2018-04-11 MED ORDER — LEVOTHYROXINE SODIUM 25 MCG PO TABS: 25.0000 ug | ORAL_TABLET | Freq: Every day | ORAL | 3 refills | Status: DC

## 2018-05-30 ENCOUNTER — Encounter: Payer: Self-pay | Admitting: Internal Medicine

## 2018-06-05 ENCOUNTER — Telehealth: Payer: Self-pay

## 2018-06-05 ENCOUNTER — Ambulatory Visit (INDEPENDENT_AMBULATORY_CARE_PROVIDER_SITE_OTHER): Payer: Medicare Other | Admitting: Internal Medicine

## 2018-06-05 ENCOUNTER — Other Ambulatory Visit: Payer: Self-pay

## 2018-06-05 DIAGNOSIS — I5022 Chronic systolic (congestive) heart failure: Secondary | ICD-10-CM

## 2018-06-05 DIAGNOSIS — I739 Peripheral vascular disease, unspecified: Secondary | ICD-10-CM | POA: Diagnosis not present

## 2018-06-05 NOTE — Telephone Encounter (Signed)
Spoke with daughter.  Will call Pt at [phone]

## 2018-06-05 NOTE — Telephone Encounter (Signed)
Spoke with Pt.  Pt to read MyChart message.  Advised to [employer] ex.  Advised invitation would come via email.

## 2018-06-05 NOTE — Progress Notes (Signed)
Electrophysiology TeleHealth Note   Due to national recommendations of social distancing due to COVID 19, an audio/video telehealth visit is felt to be most appropriate for this patient at this time.  See MyChart message from today for the patient's consent to telehealth for [facility].   Date:  06/05/2018   ID:  [name], DOB 11-Jul-1950, MRN [id number]  Location: patient's home  Provider location: [address], [place]  Evaluation Performed: Follow-up visit  PCP:  Corwin Levins, MD  Cardiologist:  Jens Som Electrophysiologist:  Ladona Ridgel  Chief Complaint:  ICD/CHF/claudication followup  History of Present Illness:    [name] is a 68 y.o. female who presents via audio/video conferencing for a telehealth visit today.  Since last being seen in our clinic, the patient reports doing very well. She has a h/o non-ischemic CM, chronic systolic heart failure, LBBB, s/p BIV ICD. She was recently found to have a markedly reduced left ABI. She is pending evaluation. She does not have any non-healing sores. She does note claudication.  Today, she denies symptoms of palpitations, chest pain, shortness of breath,  lower extremity edema, dizziness, presyncope, or syncope.  The patient is otherwise without complaint today.  The patient denies symptoms of fevers, chills, cough, or new SOB worrisome for COVID 19.    Past Medical History:  Diagnosis Date  . ALLERGIC RHINITIS 03/17/2008  . Anemia, iron deficiency   . Blood transfusion complicating pregnancy    after first child at 73 yo  . CAD (coronary artery disease)    Nonobstructive by cardiac catheter 8/12:EF 10-15%, circumflex calcified without obstructive CAD, proximal LAD 20%, mid LAD 20%, proximal RCA 20%, mid RCA 20%.  . Cardiomyopathy    a. remote h/o cath with reported nonobs CAD; b. echo 5/12: EF 15%, mild LVH, mild MR, LAE, mod pericardial effusion with increased filling pressures  . Cervical disc disease     with recurrent radicular pain, Dr. Montez Morita  . COPD (chronic obstructive pulmonary disease) (HCC) 06/14/2010  . DJD (degenerative joint disease)    hands  . Genital warts    hx  . GOITER, MULTINODULAR 12/23/2009  . Hyperlipidemia 11/23/2015  . Hypertension   . Hypothyroid   . Myocardial infarction (HCC)   . Systolic CHF [facility])     Past Surgical History:  Procedure Laterality Date  . CARDIAC CATHETERIZATION  1995   with "timy blockage" [facility] Dr. Daisy Floro  . CARDIAC DEFIBRILLATOR PLACEMENT  01-22-11   by Dr.Haillie Radu  . CHOLECYSTECTOMY    . EP IMPLANTABLE DEVICE N/A 01/24/2016   Procedure: ICD Generator Changeout;  Surgeon: Marinus Maw, MD;  Location: [facility] INVASIVE CV LAB;  Service: Cardiovascular;  Laterality: N/A;  . IMPLANTABLE CARDIOVERTER DEFIBRILLATOR IMPLANT N/A 01/22/2011   Procedure: IMPLANTABLE CARDIOVERTER DEFIBRILLATOR IMPLANT;  Surgeon: Marinus Maw, MD;  Location: [facility] CATH LAB;  Service: Cardiovascular;  Laterality: N/A;    Current Outpatient Medications  Medication Sig Dispense Refill  . albuterol (PROVENTIL) (5 MG/ML) 0.5% nebulizer solution Take 0.5 mLs (2.5 mg total) by nebulization every 6 (six) hours as needed for wheezing or shortness of breath. 20 mL 11  . aspirin 81 MG EC tablet Take 81 mg by mouth daily.     . carvedilol (COREG) 25 MG tablet Take 1 tablet (25 mg total) by mouth 2 (two) times daily with a meal. 90 tablet 3  . Cholecalciferol (VITAMIN D3) 1000 UNITS CAPS Take 1,000 Units by mouth daily.     Marland Kitchen  fexofenadine (ALLEGRA) 180 MG tablet Take 180 mg by mouth daily.      . furosemide (LASIX) 40 MG tablet Take 1 tablet (40 mg total) by mouth daily. 90 tablet 1  . hydrocortisone cream 1 % Apply 1 application topically 3 (three) times daily as needed for itching.    . levothyroxine (SYNTHROID, LEVOTHROID) 100 MCG tablet Take 2 tablets (200 mcg total) by mouth daily. 180 tablet 1  . levothyroxine (SYNTHROID, LEVOTHROID) 25 MCG tablet Take 1 tablet (25  mcg total) by mouth daily before breakfast. In addition to the 200 mcg 90 tablet 3  . lisinopril (PRINIVIL,ZESTRIL) 20 MG tablet Take 1 tablet (20 mg total) by mouth daily. 90 tablet 3  . spironolactone (ALDACTONE) 50 MG tablet Take 1 tablet (50 mg total) by mouth daily. 90 tablet 3  . tiotropium (SPIRIVA) 18 MCG inhalation capsule Place 1 capsule (18 mcg total) into inhaler and inhale every morning. 90 capsule 3  . triamcinolone (NASACORT) 55 MCG/ACT AERO nasal inhaler Place 1 spray into the nose daily. 1 Inhaler 11   No current facility-administered medications for this visit.     Allergies:   Ivp dye [iodinated diagnostic agents]; Latex; Penicillins; and Strawberry extract   Social History:  The patient  reports that she has quit smoking. She smoked 0.25 packs per day. She has never used smokeless tobacco. She reports that she does not drink alcohol or use drugs.   Family History:  The patient's  family history includes Alcohol abuse in an other family member; Cancer in her sister; Depression in her sister; Diabetes in her father, mother, and sister; Heart disease in her father, mother, and sister; Hypertension in her father, mother, and sister; Malignant hyperthermia in an other family member; Stroke in her father and mother; Thyroid disease in her sister; Transient ischemic attack in her maternal grandfather.   ROS:  Please see the history of present illness.   All other systems are personally reviewed and negative.    Exam:    Vital Signs:  There were no vitals taken for this visit.  Well appearing, alert and conversant, regular work of breathing,  good skin color Eyes- anicteric, neuro- grossly intact, skin- no apparent rash or lesions or cyanosis, mouth- oral mucosa is pink   Labs/Other Tests and Data Reviewed:    Recent Labs: 04/10/2018: ALT 10; BUN 15; Creatinine, Ser 1.26; Hemoglobin 13.0; Platelets 231.0; Potassium 4.5; Sodium 140; TSH 17.83   Wt Readings from Last 3  Encounters:  04/10/18 167 lb (75.8 kg)  12/18/17 164 lb (74.4 kg)  12/10/17 166 lb (75.3 kg)     Other studies personally reviewed  Last device remote is reviewed from [employer] PDF dated 04/05/2018 which reveals normal device function, no arrhythmias    ASSESSMENT & PLAN:    1.  Claudication - this is a new problem. I would like her to see Dr. Kennith Maes in 2-3 months. 2. ICD - her interogation from 1/20 demonstrates normal device function.  3. Chronic systolic heart failure - she is walkling daily and has class 2 symptoms. 4. COVID 19 screen The patient denies symptoms of COVID 19 at this time.  The importance of social distancing was discussed today.  Follow-up:  With Dr. Kennith Maes in 2-3 months and with me in a year Next remote: April 2020.  Current medicines are reviewed at length with the patient today.   The patient does not have concerns regarding her medicines.  The following changes were made today:  none  Labs/ tests ordered today include: none No orders of the defined types were placed in this encounter.    Patient Risk:  after full review of this patients clinical status, I feel that they are at moderate risk at this time.  Today, I have spent 20 minutes with the patient with telehealth technology discussing her problems as above .    Signed, Lewayne Bunting, MD  06/05/2018 2:56 PM     [facility] [address] [place] [address] [phone] (office) [phone] (fax)

## 2018-06-10 ENCOUNTER — Encounter: Payer: Medicare Other | Admitting: Internal Medicine

## 2018-06-12 ENCOUNTER — Ambulatory Visit: Payer: Medicare Other | Admitting: Internal Medicine

## 2018-07-07 ENCOUNTER — Other Ambulatory Visit: Payer: Self-pay

## 2018-07-07 ENCOUNTER — Ambulatory Visit (INDEPENDENT_AMBULATORY_CARE_PROVIDER_SITE_OTHER): Payer: Medicare Other | Admitting: *Deleted

## 2018-07-07 DIAGNOSIS — I5022 Chronic systolic (congestive) heart failure: Secondary | ICD-10-CM

## 2018-07-07 DIAGNOSIS — I429 Cardiomyopathy, unspecified: Secondary | ICD-10-CM

## 2018-07-08 ENCOUNTER — Telehealth: Payer: Self-pay

## 2018-07-08 LAB — CUP PACEART REMOTE DEVICE CHECK
Battery Remaining Longevity: 63 mo
Battery Voltage: 3.01 V
Brady Statistic AP VP Percent: 30.54 %
Brady Statistic AP VS Percent: 0.01 %
Brady Statistic AS VP Percent: 68.63 %
Brady Statistic AS VS Percent: 0.82 %
Brady Statistic RA Percent Paced: 30.37 %
Brady Statistic RV Percent Paced: 98.77 %
Date Time Interrogation Session: 20200428161721
Implantable Lead Implant Date: 20121112
Implantable Lead Implant Date: 20121112
Implantable Lead Implant Date: 20121112
Implantable Lead Location: 753858
Implantable Lead Location: 753859
Implantable Lead Location: 753860
Implantable Lead Model: 4194
Implantable Lead Model: 5076
Implantable Lead Model: 6947
Implantable Pulse Generator Implant Date: 20171114
Lead Channel Impedance Value: 399 Ohm
Lead Channel Impedance Value: 418 Ohm
Lead Channel Impedance Value: 437 Ohm
Lead Channel Impedance Value: 456 Ohm
Lead Channel Impedance Value: 494 Ohm
Lead Channel Impedance Value: 532 Ohm
Lead Channel Impedance Value: 589 Ohm
Lead Channel Impedance Value: 665 Ohm
Lead Channel Impedance Value: 817 Ohm
Lead Channel Pacing Threshold Amplitude: 0.5 V
Lead Channel Pacing Threshold Amplitude: 0.625 V
Lead Channel Pacing Threshold Pulse Width: 0.4 ms
Lead Channel Pacing Threshold Pulse Width: 0.4 ms
Lead Channel Sensing Intrinsic Amplitude: 10.375 mV
Lead Channel Sensing Intrinsic Amplitude: 10.375 mV
Lead Channel Sensing Intrinsic Amplitude: 3.75 mV
Lead Channel Sensing Intrinsic Amplitude: 3.75 mV
Lead Channel Setting Pacing Amplitude: 1.5 V
Lead Channel Setting Pacing Amplitude: 1.5 V
Lead Channel Setting Pacing Amplitude: 2 V
Lead Channel Setting Pacing Pulse Width: 0.4 ms
Lead Channel Setting Pacing Pulse Width: 1 ms
Lead Channel Setting Sensing Sensitivity: 2.8 mV

## 2018-07-08 NOTE — Telephone Encounter (Signed)
Left message for patient to remind of missed remote transmission.  

## 2018-07-14 ENCOUNTER — Ambulatory Visit (INDEPENDENT_AMBULATORY_CARE_PROVIDER_SITE_OTHER): Payer: Medicare Other | Admitting: Internal Medicine

## 2018-07-14 ENCOUNTER — Encounter: Payer: Self-pay | Admitting: Internal Medicine

## 2018-07-14 DIAGNOSIS — R739 Hyperglycemia, unspecified: Secondary | ICD-10-CM | POA: Diagnosis not present

## 2018-07-14 DIAGNOSIS — Z0000 Encounter for general adult medical examination without abnormal findings: Secondary | ICD-10-CM | POA: Diagnosis not present

## 2018-07-14 DIAGNOSIS — E039 Hypothyroidism, unspecified: Secondary | ICD-10-CM | POA: Diagnosis not present

## 2018-07-14 DIAGNOSIS — I428 Other cardiomyopathies: Secondary | ICD-10-CM | POA: Diagnosis not present

## 2018-07-14 MED ORDER — LEVOTHYROXINE SODIUM 100 MCG PO TABS: 200.0000 ug | ORAL_TABLET | Freq: Every day | ORAL | 3 refills | Status: DC

## 2018-07-14 MED ORDER — CARVEDILOL 25 MG PO TABS: 25.0000 mg | ORAL_TABLET | Freq: Two times a day (BID) | ORAL | 3 refills | Status: DC

## 2018-07-14 MED ORDER — TRIAMCINOLONE ACETONIDE 55 MCG/ACT NA AERO: 1.0000 | INHALATION_SPRAY | Freq: Every day | NASAL | 11 refills | Status: DC

## 2018-07-14 NOTE — Progress Notes (Signed)
Patient ID: [name], female   DOB: Apr 06, 1950, 68 y.o.   MRN: [id number]  Virtual Visit via Video Note  I connected with Gilford Rile Riege on 07/14/18 at  1:40 PM EDT by a video enabled telemedicine application and verified that I am speaking with the correct person using two identifiers.  Location: Patient: at home Provider: at office   I discussed the limitations of evaluation and management by telemedicine and the availability of in person appointments. The patient expressed understanding and agreed to proceed.  History of Present Illness: Here for wellness and f/u;  Overall doing ok;  Pt denies Chest pain, worsening SOB, DOE, wheezing, orthopnea, PND, worsening LE edema, palpitations, dizziness or syncope.  Pt denies neurological change such as new headache, facial or extremity weakness.  Pt denies polydipsia, polyuria, or low sugar symptoms. Pt states overall good compliance with treatment and medications, good tolerability, and has been trying to follow appropriate diet.  Pt denies worsening depressive symptoms, suicidal ideation or panic. No fever, night sweats, wt loss, loss of appetite, or other constitutional symptoms.  Pt states good ability with ADL's, has low fall risk, home safety reviewed and adequate, no other significant changes in hearing or vision, and only occasionally active with exercise.  No new complaints Past Medical History:  Diagnosis Date  . ALLERGIC RHINITIS 03/17/2008  . Anemia, iron deficiency   . Blood transfusion complicating pregnancy    after first child at 30 yo  . CAD (coronary artery disease)    Nonobstructive by cardiac catheter 8/12:EF 10-15%, circumflex calcified without obstructive CAD, proximal LAD 20%, mid LAD 20%, proximal RCA 20%, mid RCA 20%.  . Cardiomyopathy    a. remote h/o cath with reported nonobs CAD; b. echo 5/12: EF 15%, mild LVH, mild MR, LAE, mod pericardial effusion with increased filling pressures  . Cervical disc disease    with  recurrent radicular pain, Dr. Montez Morita  . COPD (chronic obstructive pulmonary disease) (HCC) 06/14/2010  . DJD (degenerative joint disease)    hands  . Genital warts    hx  . GOITER, MULTINODULAR 12/23/2009  . Hyperlipidemia 11/23/2015  . Hypertension   . Hypothyroid   . Myocardial infarction (HCC)   . Systolic CHF [facility])    Past Surgical History:  Procedure Laterality Date  . CARDIAC CATHETERIZATION  1995   with "timy blockage" [facility] Dr. Daisy Floro  . CARDIAC DEFIBRILLATOR PLACEMENT  01-22-11   by Dr.Taylor  . CHOLECYSTECTOMY    . EP IMPLANTABLE DEVICE N/A 01/24/2016   Procedure: ICD Generator Changeout;  Surgeon: Marinus Maw, MD;  Location: [facility] INVASIVE CV LAB;  Service: Cardiovascular;  Laterality: N/A;  . IMPLANTABLE CARDIOVERTER DEFIBRILLATOR IMPLANT N/A 01/22/2011   Procedure: IMPLANTABLE CARDIOVERTER DEFIBRILLATOR IMPLANT;  Surgeon: Marinus Maw, MD;  Location: [facility] CATH LAB;  Service: Cardiovascular;  Laterality: N/A;    reports that she has quit smoking. She smoked 0.25 packs per day. She has never used smokeless tobacco. She reports that she does not drink alcohol or use drugs. family history includes Alcohol abuse in an other family member; Cancer in her sister; Depression in her sister; Diabetes in her father, mother, and sister; Heart disease in her father, mother, and sister; Hypertension in her father, mother, and sister; Malignant hyperthermia in an other family member; Stroke in her father and mother; Thyroid disease in her sister; Transient ischemic attack in her maternal grandfather. Allergies  Allergen Reactions  . Ivp Dye [Iodinated Diagnostic Agents] Itching and Rash  rash, itching & peel (patient has a MILDER reaction when given w/benadryl)  . Latex Rash    Allergic to latex gloves  . Penicillins Itching, Swelling, Rash and Other (See Comments)    Has patient had a PCN reaction causing immediate rash, facial/tongue/throat swelling, SOB or lightheadedness  with hypotension:Yes Has patient had a PCN reaction causing severe rash involving mucus membranes or skin necrosis:Yes Has patient had a PCN reaction that required hospitalization:Pt. Was inpatient when reaction occurred Has patient had a PCN reaction occurring within the last 10 years: Yes If all of the above answers are "NO", then may proceed with Cephalosporin use.   . Strawberry Extract Swelling and Rash        Current Outpatient Medications on File Prior to Visit  Medication Sig Dispense Refill  . albuterol (PROVENTIL) (5 MG/ML) 0.5% nebulizer solution Take 0.5 mLs (2.5 mg total) by nebulization every 6 (six) hours as needed for wheezing or shortness of breath. 20 mL 11  . aspirin 81 MG EC tablet Take 81 mg by mouth daily.     . Cholecalciferol (VITAMIN D3) 1000 UNITS CAPS Take 1,000 Units by mouth daily.     . fexofenadine (ALLEGRA) 180 MG tablet Take 180 mg by mouth daily.      . furosemide (LASIX) 40 MG tablet Take 1 tablet (40 mg total) by mouth daily. 90 tablet 1  . hydrocortisone cream 1 % Apply 1 application topically 3 (three) times daily as needed for itching.    . levothyroxine (SYNTHROID, LEVOTHROID) 25 MCG tablet Take 1 tablet (25 mcg total) by mouth daily before breakfast. In addition to the 200 mcg 90 tablet 3  . lisinopril (PRINIVIL,ZESTRIL) 20 MG tablet Take 1 tablet (20 mg total) by mouth daily. 90 tablet 3  . spironolactone (ALDACTONE) 50 MG tablet Take 1 tablet (50 mg total) by mouth daily. 90 tablet 3  . tiotropium (SPIRIVA) 18 MCG inhalation capsule Place 1 capsule (18 mcg total) into inhaler and inhale every morning. 90 capsule 3   No current facility-administered medications on file prior to visit.     Observations/Objective: Alert, NAD, appropriate mood and affect, resps normal, cn 2-12 intact, moves all 4s, no visible rash or swelling Lab Results  Component Value Date   WBC 4.7 04/10/2018   HGB 13.0 04/10/2018   HCT 39.3 04/10/2018   PLT 231.0 04/10/2018    GLUCOSE 103 (H) 04/10/2018   CHOL 179 04/10/2018   TRIG 157.0 (H) 04/10/2018   HDL 34.80 (L) 04/10/2018   LDLDIRECT 92.0 04/24/2017   LDLCALC 113 (H) 04/10/2018   ALT 10 04/10/2018   AST 13 04/10/2018   NA 140 04/10/2018   K 4.5 04/10/2018   CL 104 04/10/2018   CREATININE 1.26 (H) 04/10/2018   BUN 15 04/10/2018   CO2 29 04/10/2018   TSH 17.83 (H) 04/10/2018   INR 1.0 01/16/2011   HGBA1C 6.3 04/10/2018   Assessment and Plan: See notes  Follow Up Instructions: See notes   I discussed the assessment and treatment plan with the patient. The patient was provided an opportunity to ask questions and all were answered. The patient agreed with the plan and demonstrated an understanding of the instructions.   The patient was advised to call back or seek an in-person evaluation if the symptoms worsen or if the condition fails to improve as anticipated.   Oliver Barre, MD

## 2018-07-14 NOTE — Patient Instructions (Signed)
Please continue all other medications as before, and refills have been done if requested.  Please have the pharmacy call with any other refills you may need.  Please continue your efforts at being more active, low cholesterol diet, and weight control.  You are otherwise up to date with prevention measures today.  Please keep your appointments with your specialists as you may have planned  Please go to the LAB in the Basement (turn left off the elevator) for the tests to be done at your convenience  You will be contacted by phone if any changes need to be made immediately.  Otherwise, you will receive a letter about your results with an explanation, but please check with MyChart first.  Please remember to sign up for MyChart if you have not done so, as this will be important to you in the future with finding out test results, communicating by private email, and scheduling acute appointments online when needed.  Please return in 6 months, or sooner if needed, with Lab testing done 3-5 days before  

## 2018-07-14 NOTE — Assessment & Plan Note (Signed)

## 2018-07-14 NOTE — Assessment & Plan Note (Signed)
stable overall by history and exam, recent data reviewed with pt, and pt to continue medical treatment as before,  to f/u any worsening symptoms or concerns, for a1c with labs 

## 2018-07-14 NOTE — Assessment & Plan Note (Signed)
stable overall by history and exam, recent data reviewed with pt, and pt to continue medical treatment as before,  to f/u any worsening symptoms or concerns, had been off her med last visit, ok for free t4 with tsh

## 2018-07-14 NOTE — Addendum Note (Signed)
Addended by: Corwin Levins on: 07/14/2018 01:59 PM   Modules accepted: Orders

## 2018-07-15 NOTE — Progress Notes (Signed)
Remote pacemaker transmission.   

## 2018-07-29 ENCOUNTER — Telehealth: Payer: Self-pay | Admitting: *Deleted

## 2018-07-29 NOTE — Telephone Encounter (Signed)
A message was left, re: follow up visit. 

## 2018-10-06 ENCOUNTER — Encounter: Payer: Medicare Other | Admitting: *Deleted

## 2018-10-07 ENCOUNTER — Telehealth: Payer: Self-pay

## 2018-10-07 NOTE — Telephone Encounter (Signed)
Left message for patient to remind of missed remote transmission.  

## 2018-10-27 ENCOUNTER — Ambulatory Visit (INDEPENDENT_AMBULATORY_CARE_PROVIDER_SITE_OTHER): Payer: Medicare Other | Admitting: *Deleted

## 2018-10-27 DIAGNOSIS — I5022 Chronic systolic (congestive) heart failure: Secondary | ICD-10-CM

## 2018-10-27 LAB — CUP PACEART REMOTE DEVICE CHECK
Battery Remaining Longevity: 64 mo
Battery Voltage: 3.01 V
Brady Statistic AP VP Percent: 17.96 %
Brady Statistic AP VS Percent: 0.01 %
Brady Statistic AS VP Percent: 81.05 %
Brady Statistic AS VS Percent: 0.99 %
Brady Statistic RA Percent Paced: 17.85 %
Brady Statistic RV Percent Paced: 98.63 %
Date Time Interrogation Session: 20200817170725
Implantable Lead Implant Date: 20121112
Implantable Lead Implant Date: 20121112
Implantable Lead Implant Date: 20121112
Implantable Lead Location: 753858
Implantable Lead Location: 753859
Implantable Lead Location: 753860
Implantable Lead Model: 4194
Implantable Lead Model: 5076
Implantable Lead Model: 6947
Implantable Pulse Generator Implant Date: 20171114
Lead Channel Impedance Value: 342 Ohm
Lead Channel Impedance Value: 380 Ohm
Lead Channel Impedance Value: 437 Ohm
Lead Channel Impedance Value: 437 Ohm
Lead Channel Impedance Value: 475 Ohm
Lead Channel Impedance Value: 532 Ohm
Lead Channel Impedance Value: 608 Ohm
Lead Channel Impedance Value: 722 Ohm
Lead Channel Impedance Value: 874 Ohm
Lead Channel Pacing Threshold Amplitude: 0.5 V
Lead Channel Pacing Threshold Amplitude: 0.625 V
Lead Channel Pacing Threshold Pulse Width: 0.4 ms
Lead Channel Pacing Threshold Pulse Width: 0.4 ms
Lead Channel Sensing Intrinsic Amplitude: 10.375 mV
Lead Channel Sensing Intrinsic Amplitude: 10.375 mV
Lead Channel Sensing Intrinsic Amplitude: 3.75 mV
Lead Channel Sensing Intrinsic Amplitude: 3.75 mV
Lead Channel Setting Pacing Amplitude: 1.5 V
Lead Channel Setting Pacing Amplitude: 1.5 V
Lead Channel Setting Pacing Amplitude: 2 V
Lead Channel Setting Pacing Pulse Width: 0.4 ms
Lead Channel Setting Pacing Pulse Width: 1 ms
Lead Channel Setting Sensing Sensitivity: 2.8 mV

## 2018-11-04 ENCOUNTER — Encounter: Payer: Self-pay | Admitting: Cardiology

## 2018-11-04 NOTE — Progress Notes (Signed)
Remote pacemaker transmission.   

## 2018-11-07 ENCOUNTER — Telehealth: Payer: Self-pay | Admitting: Cardiology

## 2018-11-07 NOTE — Progress Notes (Signed)
HPI: FU dilated cardiomyopathy. She was in the hospital in March 2012 with severe hypothyroidism and congestive heart failure. Followup echocardiogram in June 2012 demonstrated continued LV dysfunction with an EF of 15%. Cardiac catheterization was performed 10/25/10: EF 10-15%, circumflex calcified without obstructive CAD, proximal LAD 20%, mid LAD 20%, proximal RCA 20%, mid RCA 20%. She was felt to have mild CAD and a nonischemic cardiomyopathy. Last echocardiogram October 2017 showed ejection fraction 40 to 45%, mild diastolic dysfunction and mild mitral regurgitation. Also with h/o BIV ICD. Since I last saw her, the patient has dyspnea with more extreme activities but not with routine activities. It is relieved with rest. It is not associated with chest pain. There is no orthopnea, PND or pedal edema. There is no syncope or palpitations. There is no exertional chest pain.   Current Outpatient Medications  Medication Sig Dispense Refill  . albuterol (PROVENTIL) (5 MG/ML) 0.5% nebulizer solution Take 0.5 mLs (2.5 mg total) by nebulization every 6 (six) hours as needed for wheezing or shortness of breath. 20 mL 11  . aspirin 81 MG EC tablet Take 81 mg by mouth daily.     . carvedilol (COREG) 25 MG tablet Take 1 tablet (25 mg total) by mouth 2 (two) times daily with a meal. 180 tablet 3  . Cholecalciferol (VITAMIN D3) 1000 UNITS CAPS Take 1,000 Units by mouth daily.     . fexofenadine (ALLEGRA) 180 MG tablet Take 180 mg by mouth daily.      . furosemide (LASIX) 40 MG tablet Take 1 tablet (40 mg total) by mouth daily. 90 tablet 1  . hydrocortisone cream 1 % Apply 1 application topically 3 (three) times daily as needed for itching.    . levothyroxine (SYNTHROID) 100 MCG tablet Take 2 tablets (200 mcg total) by mouth daily. 180 tablet 3  . levothyroxine (SYNTHROID, LEVOTHROID) 25 MCG tablet Take 1 tablet (25 mcg total) by mouth daily before breakfast. In addition to the 200 mcg 90 tablet 3  .  lisinopril (PRINIVIL,ZESTRIL) 20 MG tablet Take 1 tablet (20 mg total) by mouth daily. 90 tablet 3  . spironolactone (ALDACTONE) 50 MG tablet Take 1 tablet (50 mg total) by mouth daily. 90 tablet 3  . tiotropium (SPIRIVA) 18 MCG inhalation capsule Place 1 capsule (18 mcg total) into inhaler and inhale every morning. 90 capsule 3  . triamcinolone (NASACORT) 55 MCG/ACT AERO nasal inhaler Place 1 spray into the nose daily. 1 Inhaler 11   No current facility-administered medications for this visit.      Past Medical History:  Diagnosis Date  . ALLERGIC RHINITIS 03/17/2008  . Anemia, iron deficiency   . Blood transfusion complicating pregnancy    after first child at 84 yo  . CAD (coronary artery disease)    Nonobstructive by cardiac catheter 8/12:EF 10-15%, circumflex calcified without obstructive CAD, proximal LAD 20%, mid LAD 20%, proximal RCA 20%, mid RCA 20%.  . Cardiomyopathy    a. remote h/o cath with reported nonobs CAD; b. echo 5/12: EF 15%, mild LVH, mild MR, LAE, mod pericardial effusion with increased filling pressures  . Cervical disc disease    with recurrent radicular pain, Dr. Montez Morita  . COPD (chronic obstructive pulmonary disease) (HCC) 06/14/2010  . DJD (degenerative joint disease)    hands  . Genital warts    hx  . GOITER, MULTINODULAR 12/23/2009  . Hyperlipidemia 11/23/2015  . Hypertension   . Hypothyroid   . Myocardial infarction (HCC)   .  Systolic CHF [facility])     Past Surgical History:  Procedure Laterality Date  . CARDIAC CATHETERIZATION  1995   with "timy blockage" [facility] Dr. Sherald Barge  . CARDIAC DEFIBRILLATOR PLACEMENT  01-22-11   by Dr.Taylor  . CHOLECYSTECTOMY    . EP IMPLANTABLE DEVICE N/A 01/24/2016   Procedure: ICD Generator Changeout;  Surgeon: Evans Lance, MD;  Location: [place] CV LAB;  Service: Cardiovascular;  Laterality: N/A;  . IMPLANTABLE CARDIOVERTER DEFIBRILLATOR IMPLANT N/A 01/22/2011   Procedure: IMPLANTABLE CARDIOVERTER  DEFIBRILLATOR IMPLANT;  Surgeon: Evans Lance, MD;  Location: [facility] CATH LAB;  Service: Cardiovascular;  Laterality: N/A;    Social History   Socioeconomic History  . Marital status: Divorced    Spouse name: Not on file  . Number of children: 3  . Years of education: Not on file  . Highest education level: Not on file  Occupational History    Employer: [employer]  Social Needs  . Financial resource strain: Not hard at all  . Food insecurity    Worry: Never true    Inability: Never true  . Transportation needs    Medical: No    Non-medical: No  Tobacco Use  . Smoking status: Former Smoker    Packs/day: 0.25  . Smokeless tobacco: Never Used  . Tobacco comment: very light, occasional stopped she states now  Substance and Sexual Activity  . Alcohol use: No    Alcohol/week: 0.0 standard drinks  . Drug use: No  . Sexual activity: Never  Lifestyle  . Physical activity    Days per week: 5 days    Minutes per session: 50 min  . Stress: Not at all  Relationships  . Social connections    Talks on phone: More than three times a week    Gets together: More than three times a week    Attends religious service: More than 4 times per year    Active member of club or organization: Yes    Attends meetings of clubs or organizations: More than 4 times per year    Relationship status: Divorced  . Intimate partner violence    Fear of current or ex partner: Not on file    Emotionally abused: Not on file    Physically abused: Not on file    Forced sexual activity: Not on file  Other Topics Concern  . Not on file  Social History Narrative  . Not on file    Family History  Problem Relation Age of Onset  . Stroke Mother   . Heart disease Mother   . Hypertension Mother   . Diabetes Mother   . Stroke Father   . Heart disease Father   . Hypertension Father   . Diabetes Father   . Cancer Sister        colon  . Heart disease Sister        pacemaker  . Hypertension Sister    . Depression Sister   . Diabetes Sister   . Thyroid disease Sister        uncertain type  . Transient ischemic attack Maternal Grandfather   . Alcohol abuse Other   . Malignant hyperthermia Other     ROS: Patient complains of nonproductive cough but no fevers or chills, hemoptysis, dysphasia, odynophagia, melena, hematochezia, dysuria, hematuria, rash, seizure activity, orthopnea, PND, pedal edema, claudication. Remaining systems are negative.  Physical Exam: Well-developed well-nourished in no acute distress.  Skin is warm and dry.  HEENT is normal.  Neck is supple.  Chest is clear to auscultation with normal expansion.  Cardiovascular exam is regular rate and rhythm.  Abdominal exam nontender or distended. No masses palpated. Extremities show no edema. neuro grossly intact  A/P  1 nonischemic cardiomyopathy-continue beta-blocker.  She is complaining of a cough likely related to lisinopril.  We will discontinue and instead treat with losartan 50 mg daily.  Check potassium and renal function.  Repeat echocardiogram.  2 prior BIV ICD-followed by Dr. Ladona Ridgelaylor.  3 coronary artery disease-continue aspirin and resume statin.  No recent chest pain.  4 hyperlipidemia-mild coronary disease previously.  Add Crestor 20 mg daily.  Check lipids and liver in 6 weeks.  5 hypertension-patient's blood pressure is controlled.  Medication adjustments as outlined above.  6 chronic systolic congestive heart failure-patient appears to be euvolemic on examination today.  Continue present dose of Lasix and spironolactone.  Continue fluid restriction and low-sodium diet.  Olga MillersBrian Crenshaw, MD

## 2018-11-07 NOTE — Telephone Encounter (Signed)
Third phone call to the patient to schedule with Dr. Stanford Breed.

## 2018-11-10 ENCOUNTER — Ambulatory Visit (INDEPENDENT_AMBULATORY_CARE_PROVIDER_SITE_OTHER): Payer: Medicare Other | Admitting: Cardiology

## 2018-11-10 ENCOUNTER — Encounter: Payer: Self-pay | Admitting: Cardiology

## 2018-11-10 ENCOUNTER — Other Ambulatory Visit: Payer: Self-pay

## 2018-11-10 VITALS — BP 128/74 | HR 71 | Temp 95.7°F | Ht 65.0 in | Wt 173.0 lb

## 2018-11-10 DIAGNOSIS — E7800 Pure hypercholesterolemia, unspecified: Secondary | ICD-10-CM | POA: Diagnosis not present

## 2018-11-10 DIAGNOSIS — I2510 Atherosclerotic heart disease of native coronary artery without angina pectoris: Secondary | ICD-10-CM | POA: Diagnosis not present

## 2018-11-10 DIAGNOSIS — I429 Cardiomyopathy, unspecified: Secondary | ICD-10-CM | POA: Diagnosis not present

## 2018-11-10 DIAGNOSIS — I5022 Chronic systolic (congestive) heart failure: Secondary | ICD-10-CM

## 2018-11-10 DIAGNOSIS — I10 Essential (primary) hypertension: Secondary | ICD-10-CM

## 2018-11-10 MED ORDER — LOSARTAN POTASSIUM 50 MG PO TABS: 50.0000 mg | ORAL_TABLET | Freq: Every day | ORAL | 3 refills | Status: DC

## 2018-11-10 MED ORDER — ROSUVASTATIN CALCIUM 20 MG PO TABS: 20.0000 mg | ORAL_TABLET | Freq: Every day | ORAL | 3 refills | Status: DC

## 2018-11-10 NOTE — Patient Instructions (Signed)
Medication Instructions:  STOP LISINOPRIL  START LOSARTAN 50 MG ONCE DAILY  START ROSUVASTATIN 20 MG ONCE DAILY  If you need a refill on your cardiac medications before your next appointment, please call your pharmacy.   Lab work: Your physician recommends that you return for lab work in: [place]  If you have labs (blood work) drawn today and your tests are completely normal, you will receive your results only by: Marland Kitchen MyChart Message (if you have MyChart) OR . A paper copy in the mail If you have any lab test that is abnormal or we need to change your treatment, we will call you to review the results.  Testing/Procedures: Your physician has requested that you have an echocardiogram. Echocardiography is a painless test that uses sound waves to create images of your heart. It provides your doctor with information about the size and shape of your heart and how well your heart's chambers and valves are working. This procedure takes approximately one hour. There are no restrictions for this procedure.[place]    Follow-Up: At [facility], you and your health needs are our priority.  As part of our continuing mission to provide you with exceptional heart care, we have created designated Provider Care Teams.  These Care Teams include your primary Cardiologist (physician) and Advanced Practice Providers (APPs -  Physician Assistants and Nurse Practitioners) who all work together to provide you with the care you need, when you need it. You will need a follow up appointment in 6 months.  Please call our office 2 months in advance to schedule this appointment.  You may see Kirk Ruths MD or one of the following Advanced Practice Providers on your designated Care Team:   Kerin Ransom, PA-C Roby Lofts, [place] . Sande Rives, PA-C

## 2018-11-18 ENCOUNTER — Ambulatory Visit (HOSPITAL_COMMUNITY): Payer: Medicare Other | Attending: Cardiovascular Disease

## 2018-11-18 ENCOUNTER — Other Ambulatory Visit: Payer: Self-pay

## 2018-11-18 DIAGNOSIS — I110 Hypertensive heart disease with heart failure: Secondary | ICD-10-CM | POA: Insufficient documentation

## 2018-11-18 DIAGNOSIS — E039 Hypothyroidism, unspecified: Secondary | ICD-10-CM | POA: Diagnosis not present

## 2018-11-18 DIAGNOSIS — E785 Hyperlipidemia, unspecified: Secondary | ICD-10-CM | POA: Diagnosis not present

## 2018-11-18 DIAGNOSIS — I2510 Atherosclerotic heart disease of native coronary artery without angina pectoris: Secondary | ICD-10-CM | POA: Insufficient documentation

## 2018-11-18 DIAGNOSIS — I252 Old myocardial infarction: Secondary | ICD-10-CM | POA: Diagnosis not present

## 2018-11-18 DIAGNOSIS — J449 Chronic obstructive pulmonary disease, unspecified: Secondary | ICD-10-CM | POA: Insufficient documentation

## 2018-11-18 DIAGNOSIS — I5020 Unspecified systolic (congestive) heart failure: Secondary | ICD-10-CM | POA: Diagnosis not present

## 2018-11-18 DIAGNOSIS — I429 Cardiomyopathy, unspecified: Secondary | ICD-10-CM | POA: Insufficient documentation

## 2019-01-06 ENCOUNTER — Ambulatory Visit (INDEPENDENT_AMBULATORY_CARE_PROVIDER_SITE_OTHER): Payer: Medicare Other | Admitting: *Deleted

## 2019-01-06 DIAGNOSIS — Z0000 Encounter for general adult medical examination without abnormal findings: Secondary | ICD-10-CM

## 2019-01-06 NOTE — Progress Notes (Addendum)
Subjective:   [name] is a 68 y.o. female who presents for Medicare Annual (Subsequent) preventive examination.  I connected with patient 01/06/19 at  3:15 PM EDT by a video/audio enabled telemedicine application and verified that I am speaking with the correct person using two identifiers. Patient stated full name and DOB. Patient gave permission to continue with virtual visit. Patient's location was at home and Nurse's location was at [place] office. Participants during this visit included patient and nurse.  Review of Systems:   Cardiac Risk Factors include: advanced age (>36men, >23 women);dyslipidemia;hypertension Sleep patterns: feels rested on waking, gets up 1-2 times nightly to void and sleeps 7-8 hours nightly.    Home Safety/Smoke Alarms: Feels safe in home. Smoke alarms in place.  Living environment; residence and Firearm Safety: 1-story house/ trailer. Lives with daughter, no needs for DME, good support system  Seat Belt Safety/Bike Helmet: Wears seat belt.      Objective:     Vitals: There were no vitals taken for this visit.  There is no height or weight on file to calculate BMI.  Advanced Directives 01/06/2019 12/18/2017 12/18/2016 01/24/2016 01/22/2011 01/22/2011  Does Patient Have a Medical Advance Directive? Yes Yes Yes Yes Patient has advance directive, copy not in chart Patient does not have advance directive  Type of Advance Directive Healthcare Power of [place];Living will Healthcare Power of [place];Living will Healthcare Power of [place];Living will - Healthcare Power of Attorney -  Copy of Healthcare Power of Attorney in Chart? No - copy requested No - copy requested No - copy requested No - copy requested Copy requested from family -  Pre-existing out of facility DNR order (yellow form or pink MOST form) - - - - No -    Tobacco Social History   Tobacco Use  Smoking Status Former Smoker  . Packs/day: 0.00  Smokeless Tobacco Never Used      Counseling given: Not Answered  Past Medical History:  Diagnosis Date  . ALLERGIC RHINITIS 03/17/2008  . Anemia, iron deficiency   . Blood transfusion complicating pregnancy    after first child at 63 yo  . CAD (coronary artery disease)    Nonobstructive by cardiac catheter 8/12:EF 10-15%, circumflex calcified without obstructive CAD, proximal LAD 20%, mid LAD 20%, proximal RCA 20%, mid RCA 20%.  . Cardiomyopathy    a. remote h/o cath with reported nonobs CAD; b. echo 5/12: EF 15%, mild LVH, mild MR, LAE, mod pericardial effusion with increased filling pressures  . Cervical disc disease    with recurrent radicular pain, Dr. Montez Morita  . COPD (chronic obstructive pulmonary disease) (HCC) 06/14/2010  . DJD (degenerative joint disease)    hands  . Genital warts    hx  . GOITER, MULTINODULAR 12/23/2009  . Hyperlipidemia 11/23/2015  . Hypertension   . Hypothyroid   . Myocardial infarction (HCC)   . Systolic CHF [facility])    Past Surgical History:  Procedure Laterality Date  . CARDIAC CATHETERIZATION  1995   with "timy blockage" [facility] Dr. Daisy Floro  . CARDIAC DEFIBRILLATOR PLACEMENT  01-22-11   by Dr.Taylor  . CHOLECYSTECTOMY    . EP IMPLANTABLE DEVICE N/A 01/24/2016   Procedure: ICD Generator Changeout;  Surgeon: Marinus Maw, MD;  Location[facility] INVASIVE CV LAB;  Service: Cardiovascular;  Laterality: N/A;  . IMPLANTABLE CARDIOVERTER DEFIBRILLATOR IMPLANT N/A 01/22/2011   Procedure: IMPLANTABLE CARDIOVERTER DEFIBRILLATOR IMPLANT;  Surgeon: Marinus Maw, MD;  Location: [facility] CATH LAB;  Service: Cardiovascular;  Laterality: N/A;   Family History  Problem Relation Age of Onset  . Stroke Mother   . Heart disease Mother   . Hypertension Mother   . Diabetes Mother   . Stroke Father   . Heart disease Father   . Hypertension Father   . Diabetes Father   . Cancer Sister        colon  . Heart disease Sister        pacemaker  . Hypertension Sister   . Depression Sister   . Diabetes  Sister   . Thyroid disease Sister        uncertain type  . Transient ischemic attack Maternal Grandfather   . Alcohol abuse Other   . Malignant hyperthermia Other    Social History   Socioeconomic History  . Marital status: Divorced    Spouse name: Not on file  . Number of children: 3  . Years of education: Not on file  . Highest education level: Not on file  Occupational History  . Occupation: Retired    Associate Professor: [employer]  Social Needs  . Financial resource strain: Not hard at all  . Food insecurity    Worry: Never true    Inability: Never true  . Transportation needs    Medical: No    Non-medical: No  Tobacco Use  . Smoking status: Former Smoker    Packs/day: 0.00  . Smokeless tobacco: Never Used  Substance and Sexual Activity  . Alcohol use: No    Alcohol/week: 0.0 standard drinks  . Drug use: No  . Sexual activity: Never  Lifestyle  . Physical activity    Days per week: 5 days    Minutes per session: 30 min  . Stress: Not at all  Relationships  . Social connections    Talks on phone: More than three times a week    Gets together: More than three times a week    Attends religious service: More than 4 times per year    Active member of club or organization: Yes    Attends meetings of clubs or organizations: More than 4 times per year    Relationship status: Divorced  Other Topics Concern  . Not on file  Social History Narrative  . Not on file    Outpatient Encounter Medications as of 01/06/2019  Medication Sig  . albuterol (PROVENTIL) (5 MG/ML) 0.5% nebulizer solution Take 0.5 mLs (2.5 mg total) by nebulization every 6 (six) hours as needed for wheezing or shortness of breath.  Marland Kitchen aspirin 81 MG EC tablet Take 81 mg by mouth daily.   . carvedilol (COREG) 25 MG tablet Take 1 tablet (25 mg total) by mouth 2 (two) times daily with a meal.  . Cholecalciferol (VITAMIN D3) 1000 UNITS CAPS Take 1,000 Units by mouth daily.   . fexofenadine (ALLEGRA) 180  MG tablet Take 180 mg by mouth daily.    . furosemide (LASIX) 40 MG tablet Take 1 tablet (40 mg total) by mouth daily.  . hydrocortisone cream 1 % Apply 1 application topically 3 (three) times daily as needed for itching.  . levothyroxine (SYNTHROID) 100 MCG tablet Take 2 tablets (200 mcg total) by mouth daily.  Marland Kitchen levothyroxine (SYNTHROID, LEVOTHROID) 25 MCG tablet Take 1 tablet (25 mcg total) by mouth daily before breakfast. In addition to the 200 mcg  . losartan (COZAAR) 50 MG tablet Take 1 tablet (50 mg total) by mouth daily.  . rosuvastatin (CRESTOR) 20 MG tablet Take  1 tablet (20 mg total) by mouth daily.  Marland Kitchen spironolactone (ALDACTONE) 50 MG tablet Take 1 tablet (50 mg total) by mouth daily.  Marland Kitchen tiotropium (SPIRIVA) 18 MCG inhalation capsule Place 1 capsule (18 mcg total) into inhaler and inhale every morning.  . triamcinolone (NASACORT) 55 MCG/ACT AERO nasal inhaler Place 1 spray into the nose daily.   No facility-administered encounter medications on file as of 01/06/2019.     Activities of Daily Living In your present state of health, do you have any difficulty performing the following activities: 01/06/2019  Hearing? N  Vision? N  Difficulty concentrating or making decisions? N  Walking or climbing stairs? N  Dressing or bathing? N  Doing errands, shopping? N  Preparing Food and eating ? N  Using the Toilet? N  In the past six months, have you accidently leaked urine? N  Do you have problems with loss of bowel control? N  Managing your Medications? N  Managing your Finances? N  Housekeeping or managing your Housekeeping? N  Some recent data might be hidden    Patient Care Team: Biagio Borg, MD as PCP - General Lovena Le Champ Mungo, MD as Consulting Physician (Cardiology) Debbra Riding, MD as Consulting Physician (Ophthalmology)    Assessment:   This is a routine wellness examination for [name]. Physical assessment deferred to PCP.  Exercise Activities and Dietary  recommendations Current Exercise Habits: Home exercise routine, Type of exercise: walking, Time (Minutes): 30, Frequency (Times/Week): 5, Weekly Exercise (Minutes/Week): 150, Intensity: Mild, Exercise limited by: orthopedic condition(s) Diet (meal preparation, eat out, water intake, caffeinated beverages, dairy products, fruits and vegetables): in general, a "healthy" diet  , well balanced eats a variety of fruits and vegetables daily, limits salt, fat/cholesterol, sugar,carbohydrates,caffeine.  Reviewed heart healthy diet. Limits fluid intake due to CHF.  Goals    . Maintain current health status     Continue to exercise, eat healthy, worship God, enjoy life, and family. I        Fall Risk Fall Risk  01/06/2019 07/14/2018 12/18/2017 12/10/2017 12/18/2016  Falls in the past year? 0 0 No No No  Number falls in past yr: 0 - - - -  Injury with Fall? 0 - - - -   Is the patient's home free of loose throw rugs in walkways, pet beds, electrical cords, etc?   yes      Grab bars in the bathroom? yes      Handrails on the stairs?   yes      Adequate lighting?   yes  Depression Screen PHQ 2/9 Scores 01/06/2019 07/14/2018 12/18/2017 12/10/2017  PHQ - 2 Score 0 0 0 0  PHQ- 9 Score - - - -     Cognitive Function MMSE - Mini Mental State Exam 12/18/2016  Orientation to time 5  Orientation to Place 5  Registration 3  Attention/ Calculation 5  Recall 2  Language- name 2 objects 2  Language- repeat 1  Language- follow 3 step command 3  Language- read & follow direction 1  Write a sentence 1  Copy design 1  Total score 29       Ad8 score reviewed for issues:  Issues making decisions: no  Less interest in hobbies / activities: no  Repeats questions, stories (family complaining): no  Trouble using ordinary gadgets (microwave, computer, phone):no  Forgets the month or year: no  Mismanaging finances: no  Remembering appts: no  Daily problems with thinking and/or memory: no Ad8  score  is= 0  Immunization History  Administered Date(s) Administered  . Influenza Split 12/12/2011  . Influenza, High Dose Seasonal PF 12/04/2016, 12/10/2017  . Influenza,inj,Quad PF,6+ Mos 11/23/2015  . Pneumococcal Conjugate-13 06/02/2014  . Pneumococcal Polysaccharide-23 11/23/2015  . Td 11/15/2009   Screening Tests Health Maintenance  Topic Date Due  . INFLUENZA VACCINE  10/11/2018  . COLONOSCOPY  04/11/2019 (Originally 10/15/1998)  . TETANUS/TDAP  11/16/2019  . DEXA SCAN  Completed  . Hepatitis C Screening  Completed  . PNA vac Low Risk Adult  Completed      Plan:      Reviewed health maintenance screenings with patient today and relevant education, vaccines, and/or referrals were provided.   Continue to eat heart healthy diet (full of fruits, vegetables, whole grains, lean protein, water--limit salt, fat, and sugar intake) and increase physical activity as tolerated.  Continue doing brain stimulating activities (puzzles, reading, adult coloring books, staying active) to keep memory sharp.    I have personally reviewed and noted the following in the patient's chart:   . Medical and social history . Use of alcohol, tobacco or illicit drugs  . Current medications and supplements . Functional ability and status . Nutritional status . Physical activity . Advanced directives . List of other physicians . Screenings to include cognitive, depression, and falls . Referrals and appointments  In addition, I have reviewed and discussed with patient certain preventive protocols, quality metrics, and best practice recommendations. A written personalized care plan for preventive services as well as general preventive health recommendations were provided to patient.     Wanda PlumpJill A , RN  01/06/2019   Medical screening examination/treatment/procedure(s) were performed by non-physician practitioner and as supervising physician I was immediately available for consultation/collaboration.  I agree with above. Oliver BarreJames John, MD

## 2019-01-22 ENCOUNTER — Ambulatory Visit: Payer: Medicare Other

## 2019-01-26 ENCOUNTER — Ambulatory Visit (INDEPENDENT_AMBULATORY_CARE_PROVIDER_SITE_OTHER): Payer: Medicare Other | Admitting: *Deleted

## 2019-01-26 DIAGNOSIS — I429 Cardiomyopathy, unspecified: Secondary | ICD-10-CM

## 2019-01-26 DIAGNOSIS — I5022 Chronic systolic (congestive) heart failure: Secondary | ICD-10-CM | POA: Diagnosis not present

## 2019-01-29 ENCOUNTER — Telehealth: Payer: Self-pay | Admitting: Emergency Medicine

## 2019-01-29 ENCOUNTER — Other Ambulatory Visit (INDEPENDENT_AMBULATORY_CARE_PROVIDER_SITE_OTHER): Payer: Medicare Other

## 2019-01-29 ENCOUNTER — Other Ambulatory Visit: Payer: Self-pay

## 2019-01-29 ENCOUNTER — Ambulatory Visit (INDEPENDENT_AMBULATORY_CARE_PROVIDER_SITE_OTHER): Payer: Medicare Other

## 2019-01-29 DIAGNOSIS — Z23 Encounter for immunization: Secondary | ICD-10-CM | POA: Diagnosis not present

## 2019-01-29 DIAGNOSIS — E039 Hypothyroidism, unspecified: Secondary | ICD-10-CM

## 2019-01-29 LAB — CUP PACEART REMOTE DEVICE CHECK
Battery Remaining Longevity: 56 mo
Battery Voltage: 3 V
Brady Statistic AP VP Percent: 22.45 %
Brady Statistic AP VS Percent: 0 %
Brady Statistic AS VP Percent: 77.13 %
Brady Statistic AS VS Percent: 0.42 %
Brady Statistic RA Percent Paced: 22.42 %
Brady Statistic RV Percent Paced: 99.48 %
Date Time Interrogation Session: 20201119164314
Implantable Lead Implant Date: 20121112
Implantable Lead Implant Date: 20121112
Implantable Lead Implant Date: 20121112
Implantable Lead Location: 753858
Implantable Lead Location: 753859
Implantable Lead Location: 753860
Implantable Lead Model: 4194
Implantable Lead Model: 5076
Implantable Lead Model: 6947
Implantable Pulse Generator Implant Date: 20171114
Lead Channel Impedance Value: 342 Ohm
Lead Channel Impedance Value: 380 Ohm
Lead Channel Impedance Value: 437 Ohm
Lead Channel Impedance Value: 437 Ohm
Lead Channel Impedance Value: 475 Ohm
Lead Channel Impedance Value: 532 Ohm
Lead Channel Impedance Value: 608 Ohm
Lead Channel Impedance Value: 703 Ohm
Lead Channel Impedance Value: 836 Ohm
Lead Channel Pacing Threshold Amplitude: 0.5 V
Lead Channel Pacing Threshold Amplitude: 0.75 V
Lead Channel Pacing Threshold Pulse Width: 0.4 ms
Lead Channel Pacing Threshold Pulse Width: 0.4 ms
Lead Channel Sensing Intrinsic Amplitude: 3.25 mV
Lead Channel Sensing Intrinsic Amplitude: 3.25 mV
Lead Channel Sensing Intrinsic Amplitude: 7.875 mV
Lead Channel Sensing Intrinsic Amplitude: 7.875 mV
Lead Channel Setting Pacing Amplitude: 1.5 V
Lead Channel Setting Pacing Amplitude: 1.5 V
Lead Channel Setting Pacing Amplitude: 2 V
Lead Channel Setting Pacing Pulse Width: 0.4 ms
Lead Channel Setting Pacing Pulse Width: 1 ms
Lead Channel Setting Sensing Sensitivity: 2.8 mV

## 2019-01-29 LAB — T4, FREE: Free T4: 0.5 ng/dL — ABNORMAL LOW (ref 0.60–1.60)

## 2019-01-29 NOTE — Telephone Encounter (Signed)
LM at home number to call device clinic. 91 day transmission showed 60 cycle interference on 10/27/18. Assess what she was doing or if she was having a procedure.

## 2019-02-20 NOTE — Progress Notes (Signed)
Remote pacemaker transmission.   

## 2019-04-27 ENCOUNTER — Ambulatory Visit (INDEPENDENT_AMBULATORY_CARE_PROVIDER_SITE_OTHER): Payer: Medicare PPO | Admitting: *Deleted

## 2019-04-27 DIAGNOSIS — I5022 Chronic systolic (congestive) heart failure: Secondary | ICD-10-CM | POA: Diagnosis not present

## 2019-04-27 LAB — CUP PACEART REMOTE DEVICE CHECK
Battery Remaining Longevity: 58 mo
Battery Voltage: 3 V
Brady Statistic AP VP Percent: 20.2 %
Brady Statistic AP VS Percent: 0.01 %
Brady Statistic AS VP Percent: 78.82 %
Brady Statistic AS VS Percent: 0.97 %
Brady Statistic RA Percent Paced: 20.11 %
Brady Statistic RV Percent Paced: 98.72 %
Date Time Interrogation Session: 20210213173217
Implantable Lead Implant Date: 20121112
Implantable Lead Implant Date: 20121112
Implantable Lead Implant Date: 20121112
Implantable Lead Location: 753858
Implantable Lead Location: 753859
Implantable Lead Location: 753860
Implantable Lead Model: 4194
Implantable Lead Model: 5076
Implantable Lead Model: 6947
Implantable Pulse Generator Implant Date: 20171114
Lead Channel Impedance Value: 342 Ohm
Lead Channel Impedance Value: 380 Ohm
Lead Channel Impedance Value: 418 Ohm
Lead Channel Impedance Value: 437 Ohm
Lead Channel Impedance Value: 475 Ohm
Lead Channel Impedance Value: 551 Ohm
Lead Channel Impedance Value: 646 Ohm
Lead Channel Impedance Value: 741 Ohm
Lead Channel Impedance Value: 893 Ohm
Lead Channel Pacing Threshold Amplitude: 0.5 V
Lead Channel Pacing Threshold Amplitude: 0.625 V
Lead Channel Pacing Threshold Pulse Width: 0.4 ms
Lead Channel Pacing Threshold Pulse Width: 0.4 ms
Lead Channel Sensing Intrinsic Amplitude: 3.5 mV
Lead Channel Sensing Intrinsic Amplitude: 3.5 mV
Lead Channel Sensing Intrinsic Amplitude: 7.875 mV
Lead Channel Sensing Intrinsic Amplitude: 7.875 mV
Lead Channel Setting Pacing Amplitude: 1.5 V
Lead Channel Setting Pacing Amplitude: 1.5 V
Lead Channel Setting Pacing Amplitude: 2 V
Lead Channel Setting Pacing Pulse Width: 0.4 ms
Lead Channel Setting Pacing Pulse Width: 1 ms
Lead Channel Setting Sensing Sensitivity: 2.8 mV

## 2019-04-28 NOTE — Progress Notes (Signed)
PPM Remote  

## 2019-05-06 NOTE — Progress Notes (Signed)
Virtual Visit via Video Note changed to phone visit at patient request   This visit type was conducted due to national recommendations for restrictions regarding the COVID-19 Pandemic (e.g. social distancing) in an effort to limit this patient's exposure and mitigate transmission in our community.  Due to her co-morbid illnesses, this patient is at least at moderate risk for complications without adequate follow up.  This format is felt to be most appropriate for this patient at this time.  All issues noted in this document were discussed and addressed.  A limited physical exam was performed with this format.  Please refer to the patient's chart for her consent to telehealth for [facility].   Date:  05/07/2019   ID:  [name], DOB [birth year], MRN [id number]  Patient Location:Home Provider Location: Home  PCP:  Biagio Borg, MD  Cardiologist:  Dr Stanford Breed  Evaluation Performed:  Follow-Up Visit  Chief Complaint:  FU CM  History of Present Illness:    FU dilated cardiomyopathy. She was in the hospital in March 2012 with severe hypothyroidism and CHF. Followup echocardiogram in June 2012 demonstrated continued LV dysfunction with an EF of 15%. Cardiac catheterization was performed 10/25/10: EF 10-15%, circumflex calcified without obstructive CAD, proximal LAD 20%, mid LAD 20%, proximal RCA 20%, mid RCA 20%. She was felt to have mild CAD and a nonischemic cardiomyopathy. Also with h/o BIV ICD. Echocardiogram September 2020 showed ejection fraction 55 to 61%, grade 1 diastolic dysfunction. Since I last saw her, the patient has dyspnea with more extreme activities but not with routine activities. It is relieved with rest. It is not associated with chest pain. There is no orthopnea, PND or pedal edema. There is no syncope or palpitations. There is no exertional chest pain.  The patient does not have symptoms concerning for COVID-19 infection (fever, chills, cough, or new shortness of  breath).    Past Medical History:  Diagnosis Date  . ALLERGIC RHINITIS 03/17/2008  . Anemia, iron deficiency   . Blood transfusion complicating pregnancy    after first child at 69 yo  . CAD (coronary artery disease)    Nonobstructive by cardiac catheter 8/12:EF 10-15%, circumflex calcified without obstructive CAD, proximal LAD 20%, mid LAD 20%, proximal RCA 20%, mid RCA 20%.  . Cardiomyopathy    a. remote h/o cath with reported nonobs CAD; b. echo 5/12: EF 15%, mild LVH, mild MR, LAE, mod pericardial effusion with increased filling pressures  . Cervical disc disease    with recurrent radicular pain, Dr. Eulas Post  . COPD (chronic obstructive pulmonary disease) ([place]) 06/14/2010  . DJD (degenerative joint disease)    hands  . Genital warts    hx  . GOITER, MULTINODULAR 12/23/2009  . Hyperlipidemia 11/23/2015  . Hypertension   . Hypothyroid   . Myocardial infarction ([place])   . Systolic CHF [facility])    Past Surgical History:  Procedure Laterality Date  . CARDIAC CATHETERIZATION  1995   with "timy blockage" [facility] Dr. Sherald Barge  . CARDIAC DEFIBRILLATOR PLACEMENT  01-22-11   by Dr.Taylor  . CHOLECYSTECTOMY    . EP IMPLANTABLE DEVICE N/A 01/24/2016   Procedure: ICD Generator Changeout;  Surgeon: Evans Lance, MD;  Location: [place] [facility] CV LAB;  Service: Cardiovascular;  Laterality: N/A;  . IMPLANTABLE CARDIOVERTER DEFIBRILLATOR IMPLANT N/A 01/22/2011   Procedure: IMPLANTABLE CARDIOVERTER DEFIBRILLATOR IMPLANT;  Surgeon: Evans Lance, MD;  Location: [facility] CATH LAB;  Service: Cardiovascular;  Laterality: N/A;  Current Meds  Medication Sig  . albuterol (PROVENTIL) (5 MG/ML) 0.5% nebulizer solution Take 0.5 mLs (2.5 mg total) by nebulization every 6 (six) hours as needed for wheezing or shortness of breath.  Marland Kitchen aspirin 81 MG EC tablet Take 81 mg by mouth daily.   . carvedilol (COREG) 25 MG tablet Take 1 tablet (25 mg total) by mouth 2 (two) times daily with a meal.  . Cholecalciferol  (VITAMIN D3) 1000 UNITS CAPS Take 1,000 Units by mouth daily.   . fexofenadine (ALLEGRA) 180 MG tablet Take 180 mg by mouth daily.    . furosemide (LASIX) 40 MG tablet Take 1 tablet (40 mg total) by mouth daily.  . hydrocortisone cream 1 % Apply 1 application topically 3 (three) times daily as needed for itching.  . levothyroxine (SYNTHROID) 100 MCG tablet Take 2 tablets (200 mcg total) by mouth daily.  Marland Kitchen levothyroxine (SYNTHROID, LEVOTHROID) 25 MCG tablet Take 1 tablet (25 mcg total) by mouth daily before breakfast. In addition to the 200 mcg  . losartan (COZAAR) 50 MG tablet Take 1 tablet (50 mg total) by mouth daily.  . rosuvastatin (CRESTOR) 20 MG tablet Take 1 tablet (20 mg total) by mouth daily.  Marland Kitchen spironolactone (ALDACTONE) 50 MG tablet Take 1 tablet (50 mg total) by mouth daily.  Marland Kitchen tiotropium (SPIRIVA) 18 MCG inhalation capsule Place 1 capsule (18 mcg total) into inhaler and inhale every morning.  . triamcinolone (NASACORT) 55 MCG/ACT AERO nasal inhaler Place 1 spray into the nose daily.     Allergies:   Ivp dye [iodinated diagnostic agents], Latex, Penicillins, and Strawberry extract   Social History   Tobacco Use  . Smoking status: Former Smoker    Packs/day: 0.00  . Smokeless tobacco: Never Used  Substance Use Topics  . Alcohol use: No    Alcohol/week: 0.0 standard drinks  . Drug use: No     Family Hx: The patient's family history includes Alcohol abuse in an other family member; Cancer in her sister; Depression in her sister; Diabetes in her father, mother, and sister; Heart disease in her father, mother, and sister; Hypertension in her father, mother, and sister; Malignant hyperthermia in an other family member; Stroke in her father and mother; Thyroid disease in her sister; Transient ischemic attack in her maternal grandfather.  ROS:   Please see the history of present illness.    No Fever, chills  or productive cough All other systems reviewed and are  negative.   Recent Lipid Panel Lab Results  Component Value Date/Time   CHOL 179 04/10/2018 10:36 AM   TRIG 157.0 (H) 04/10/2018 10:36 AM   HDL 34.80 (L) 04/10/2018 10:36 AM   CHOLHDL 5 04/10/2018 10:36 AM   LDLCALC 113 (H) 04/10/2018 10:36 AM   LDLDIRECT 92.0 04/24/2017 03:59 PM    Wt Readings from Last 3 Encounters:  05/07/19 169 lb 6.4 oz (76.8 kg)  11/10/18 173 lb (78.5 kg)  04/10/18 167 lb (75.8 kg)     Objective:    Vital Signs:  Ht 5\' 5"  (1.651 m)   Wt 169 lb 6.4 oz (76.8 kg)   BMI 28.19 kg/m    VITAL SIGNS:  reviewed NAD Answers questions appropriately Normal affect Remainder of physical examination not performed (telehealth visit; coronavirus pandemic)  ASSESSMENT & PLAN:    1. History of nonischemic cardiomyopathy-LV function improved on most recent echocardiogram.  Continue beta-blocker at present dose.  Continue ARB. 2. Coronary artery disease-plan to continue aspirin and statin. 3.  Hypertension- Continue present medications and follow.  Check potassium and renal function. 4. Hyperlipidemia-continue statin.  Check lipids and liver. 5. Chronic combined systolic/diastolic congestive heart failure-she is euvolemic.  Continue Lasix and spironolactone at present dose.  Check potassium and renal function. 6. Prior biventricular ICD-followed by electrophysiology.  COVID-19 Education: The importance of social distancing was discussed today.  Time:   Today, I have spent 16 minutes with the patient with telehealth technology discussing the above problems.     Medication Adjustments/Labs and Tests Ordered: Current medicines are reviewed at length with the patient today.  Concerns regarding medicines are outlined above.   Tests Ordered: No orders of the defined types were placed in this encounter.   Medication Changes: No orders of the defined types were placed in this encounter.   Follow Up:  Either In Person or Virtual in 6 month(s)  Signed, Olga Millers, MD  05/07/2019 8:13 AM    [facility]

## 2019-05-07 ENCOUNTER — Encounter: Payer: Self-pay | Admitting: Cardiology

## 2019-05-07 ENCOUNTER — Telehealth (INDEPENDENT_AMBULATORY_CARE_PROVIDER_SITE_OTHER): Payer: Medicare PPO | Admitting: Cardiology

## 2019-05-07 VITALS — Ht 65.0 in | Wt 169.4 lb

## 2019-05-07 DIAGNOSIS — Z95810 Presence of automatic (implantable) cardiac defibrillator: Secondary | ICD-10-CM | POA: Diagnosis not present

## 2019-05-07 DIAGNOSIS — I5042 Chronic combined systolic (congestive) and diastolic (congestive) heart failure: Secondary | ICD-10-CM

## 2019-05-07 DIAGNOSIS — I428 Other cardiomyopathies: Secondary | ICD-10-CM | POA: Diagnosis not present

## 2019-05-07 DIAGNOSIS — I2510 Atherosclerotic heart disease of native coronary artery without angina pectoris: Secondary | ICD-10-CM | POA: Diagnosis not present

## 2019-05-07 DIAGNOSIS — I110 Hypertensive heart disease with heart failure: Secondary | ICD-10-CM | POA: Diagnosis not present

## 2019-05-07 DIAGNOSIS — I5022 Chronic systolic (congestive) heart failure: Secondary | ICD-10-CM

## 2019-05-07 DIAGNOSIS — I429 Cardiomyopathy, unspecified: Secondary | ICD-10-CM

## 2019-05-07 DIAGNOSIS — E7800 Pure hypercholesterolemia, unspecified: Secondary | ICD-10-CM

## 2019-05-07 DIAGNOSIS — I10 Essential (primary) hypertension: Secondary | ICD-10-CM

## 2019-05-07 MED ORDER — SPIRONOLACTONE 50 MG PO TABS: 50.0000 mg | ORAL_TABLET | Freq: Every day | ORAL | 3 refills | Status: DC

## 2019-05-07 NOTE — Telephone Encounter (Signed)
This encounter was created in error - please disregard.

## 2019-05-07 NOTE — Patient Instructions (Signed)
Medication Instructions:  NO CHANGE *If you need a refill on your cardiac medications before your next appointment, please call your pharmacy*  Lab Work: Your physician recommends that you return for lab work PRIOR TO EATING  If you have labs (blood work) drawn today and your tests are completely normal, you will receive your results only by: . MyChart Message (if you have MyChart) OR . A paper copy in the mail If you have any lab test that is abnormal or we need to change your treatment, we will call you to review the results.  Follow-Up: At [facility], you and your health needs are our priority.  As part of our continuing mission to provide you with exceptional heart care, we have created designated Provider Care Teams.  These Care Teams include your primary Cardiologist (physician) and Advanced Practice Providers (APPs -  Physician Assistants and Nurse Practitioners) who all work together to provide you with the care you need, when you need it.  Your next appointment:   6 month(s)  The format for your next appointment:   Either In Person or Virtual  Provider:   You may see BRIAN CRENSHAW MD or one of the following Advanced Practice Providers on your designated Care Team:    Luke Kilroy, PA-C  Callie Goodrich, PA-C  Jesse Cleaver, FNP    

## 2019-05-07 NOTE — Telephone Encounter (Signed)
New Message   Patient states that she is returning your call. Please call.

## 2019-06-26 ENCOUNTER — Encounter: Payer: Self-pay | Admitting: Internal Medicine

## 2019-06-26 ENCOUNTER — Ambulatory Visit (INDEPENDENT_AMBULATORY_CARE_PROVIDER_SITE_OTHER): Payer: Medicare PPO | Admitting: Internal Medicine

## 2019-06-26 ENCOUNTER — Other Ambulatory Visit: Payer: Self-pay

## 2019-06-26 DIAGNOSIS — I429 Cardiomyopathy, unspecified: Secondary | ICD-10-CM | POA: Diagnosis not present

## 2019-06-26 DIAGNOSIS — I5022 Chronic systolic (congestive) heart failure: Secondary | ICD-10-CM

## 2019-06-26 NOTE — Progress Notes (Signed)
HPI [name] returns today for ongoing followup of her chronic systolic heart failure, HTN, and CHB, s/p PPM insertion. She has done well in the interim with no chest pain and her CHF symptoms are class 2A. She had normalization of her EF after her biv upgrade. She has non-obstructive CAD.  Allergies  Allergen Reactions  . Ivp Dye [Iodinated Diagnostic Agents] Itching and Rash    rash, itching & peel (patient has a MILDER reaction when given w/benadryl)  . Latex Rash    Allergic to latex gloves  . Penicillins Itching, Swelling, Rash and Other (See Comments)    Has patient had a PCN reaction causing immediate rash, facial/tongue/throat swelling, SOB or lightheadedness with hypotension:Yes Has patient had a PCN reaction causing severe rash involving mucus membranes or skin necrosis:Yes Has patient had a PCN reaction that required hospitalization:Pt. Was inpatient when reaction occurred Has patient had a PCN reaction occurring within the last 10 years: Yes If all of the above answers are "NO", then may proceed with Cephalosporin use.   . Strawberry Extract Swelling and Rash          Current Outpatient Medications  Medication Sig Dispense Refill  . albuterol (PROVENTIL) (5 MG/ML) 0.5% nebulizer solution Take 0.5 mLs (2.5 mg total) by nebulization every 6 (six) hours as needed for wheezing or shortness of breath. 20 mL 11  . aspirin 81 MG EC tablet Take 81 mg by mouth daily.     . carvedilol (COREG) 25 MG tablet Take 1 tablet (25 mg total) by mouth 2 (two) times daily with a meal. 180 tablet 3  . Cholecalciferol (VITAMIN D3) 1000 UNITS CAPS Take 1,000 Units by mouth daily.     . fexofenadine (ALLEGRA) 180 MG tablet Take 180 mg by mouth daily.      . furosemide (LASIX) 40 MG tablet Take 1 tablet (40 mg total) by mouth daily. 90 tablet 1  . hydrocortisone cream 1 % Apply 1 application topically 3 (three) times daily as needed for itching.    . levothyroxine (SYNTHROID) 100 MCG  tablet Take 2 tablets (200 mcg total) by mouth daily. 180 tablet 3  . levothyroxine (SYNTHROID, LEVOTHROID) 25 MCG tablet Take 1 tablet (25 mcg total) by mouth daily before breakfast. In addition to the 200 mcg 90 tablet 3  . spironolactone (ALDACTONE) 50 MG tablet Take 1 tablet (50 mg total) by mouth daily. 90 tablet 3  . tiotropium (SPIRIVA) 18 MCG inhalation capsule Place 1 capsule (18 mcg total) into inhaler and inhale every morning. 90 capsule 3  . triamcinolone (NASACORT) 55 MCG/ACT AERO nasal inhaler Place 1 spray into the nose daily. 1 Inhaler 11  . losartan (COZAAR) 50 MG tablet Take 1 tablet (50 mg total) by mouth daily. 90 tablet 3  . rosuvastatin (CRESTOR) 20 MG tablet Take 1 tablet (20 mg total) by mouth daily. 90 tablet 3   No current facility-administered medications for this visit.     Past Medical History:  Diagnosis Date  . ALLERGIC RHINITIS 03/17/2008  . Anemia, iron deficiency   . Blood transfusion complicating pregnancy    after first child at 25 yo  . CAD (coronary artery disease)    Nonobstructive by cardiac catheter 8/12:EF 10-15%, circumflex calcified without obstructive CAD, proximal LAD 20%, mid LAD 20%, proximal RCA 20%, mid RCA 20%.  . Cardiomyopathy    a. remote h/o cath with reported nonobs CAD; b. echo 5/12: EF 15%, mild LVH, mild MR,  LAE, mod pericardial effusion with increased filling pressures  . Cervical disc disease    with recurrent radicular pain, Dr. Montez Morita  . COPD (chronic obstructive pulmonary disease) (HCC) 06/14/2010  . DJD (degenerative joint disease)    hands  . Genital warts    hx  . GOITER, MULTINODULAR 12/23/2009  . Hyperlipidemia 11/23/2015  . Hypertension   . Hypothyroid   . Myocardial infarction (HCC)   . Systolic CHF (HCC)     ROS:   All systems reviewed and negative except as noted in the HPI.   Past Surgical History:  Procedure Laterality Date  . CARDIAC CATHETERIZATION  1995   with "timy blockage" [facility] Dr.  Daisy Floro  . CARDIAC DEFIBRILLATOR PLACEMENT  01-22-11   by Dr.Corri Delapaz  . CHOLECYSTECTOMY    . EP IMPLANTABLE DEVICE N/A 01/24/2016   Procedure: ICD Generator Changeout;  Surgeon: Marinus Maw, MD;  Location: [facility] INVASIVE CV LAB;  Service: Cardiovascular;  Laterality: N/A;  . IMPLANTABLE CARDIOVERTER DEFIBRILLATOR IMPLANT N/A 01/22/2011   Procedure: IMPLANTABLE CARDIOVERTER DEFIBRILLATOR IMPLANT;  Surgeon: Marinus Maw, MD;  Location: [facility] CATH LAB;  Service: Cardiovascular;  Laterality: N/A;     Family History  Problem Relation Age of Onset  . Stroke Mother   . Heart disease Mother   . Hypertension Mother   . Diabetes Mother   . Stroke Father   . Heart disease Father   . Hypertension Father   . Diabetes Father   . Cancer Sister        colon  . Heart disease Sister        pacemaker  . Hypertension Sister   . Depression Sister   . Diabetes Sister   . Thyroid disease Sister        uncertain type  . Transient ischemic attack Maternal Grandfather   . Alcohol abuse Other   . Malignant hyperthermia Other      Social History   Socioeconomic History  . Marital status: Divorced    Spouse name: Not on file  . Number of children: 3  . Years of education: Not on file  . Highest education level: Not on file  Occupational History  . Occupation: Retired    Associate Professor: Advice worker [employer]  Tobacco Use  . Smoking status: Former Smoker    Packs/day: 0.00  . Smokeless tobacco: Never Used  Substance and Sexual Activity  . Alcohol use: No    Alcohol/week: 0.0 standard drinks  . Drug use: No  . Sexual activity: Never  Other Topics Concern  . Not on file  Social History Narrative  . Not on file   Social Determinants of Health   Financial Resource Strain:   . Difficulty of Paying Living Expenses:   Food Insecurity:   . Worried About Programme researcher, broadcasting/film/video in the Last Year:   . Barista in the Last Year:   Transportation Needs:   . Freight forwarder (Medical):   Marland Kitchen  Lack of Transportation (Non-Medical):   Physical Activity: Unknown  . Days of Exercise per Week: Not on file  . Minutes of Exercise per Session: 30 min  Stress:   . Feeling of Stress :   Social Connections:   . Frequency of Communication with Friends and Family:   . Frequency of Social Gatherings with Friends and Family:   . Attends Religious Services:   . Active Member of Clubs or Organizations:   . Attends Banker Meetings:   .  Marital Status:   Intimate Partner Violence:   . Fear of Current or Ex-Partner:   . Emotionally Abused:   Marland Kitchen Physically Abused:   . Sexually Abused:      BP 128/78   Pulse 62   Ht 5\' 5"  (1.651 m)   Wt 169 lb (76.7 kg)   SpO2 98%   BMI 28.12 kg/m   Physical Exam:  Well appearing NAD HEENT: Unremarkable Neck:  No JVD, no thyromegally Lymphatics:  No adenopathy Back:  No CVA tenderness Lungs:  Clear with no wheezes HEART:  Regular rate rhythm, no murmurs, no rubs, no clicks Abd:  soft, positive bowel sounds, no organomegally, no rebound, no guarding Ext:  2 plus pulses, no edema, no cyanosis, no clubbing Skin:  No rashes no nodules Neuro:  CN II through XII intact, motor grossly intact  EKG - nsr with biv pacing  DEVICE  Normal device function.  See PaceArt for details.   Assess/Plan: 1. Chronic systolic heart failure - she has had normalization of her LV function and her symptoms are well controlled. 2. BIV PPM - her medtronic Biv PPM is working normally. She has about 4 years on the battery.  3. HTN - her SBP is well controlled. She will continue her current meds. 4. Dyslipidemia - she will continue crestor 20 mg daily.   Mikle Bosworth.D.

## 2019-07-01 LAB — CUP PACEART INCLINIC DEVICE CHECK
Battery Remaining Longevity: 59 mo
Battery Voltage: 3 V
Brady Statistic AP VP Percent: 22.84 %
Brady Statistic AP VS Percent: 0.01 %
Brady Statistic AS VP Percent: 76.43 %
Brady Statistic AS VS Percent: 0.72 %
Brady Statistic RA Percent Paced: 22.73 %
Brady Statistic RV Percent Paced: 98.94 %
Date Time Interrogation Session: 20210416115029
Implantable Lead Implant Date: 20121112
Implantable Lead Implant Date: 20121112
Implantable Lead Implant Date: 20121112
Implantable Lead Location: 753858
Implantable Lead Location: 753859
Implantable Lead Location: 753860
Implantable Lead Model: 4194
Implantable Lead Model: 5076
Implantable Lead Model: 6947
Implantable Pulse Generator Implant Date: 20171114
Lead Channel Impedance Value: 361 Ohm
Lead Channel Impedance Value: 380 Ohm
Lead Channel Impedance Value: 437 Ohm
Lead Channel Impedance Value: 456 Ohm
Lead Channel Impedance Value: 513 Ohm
Lead Channel Impedance Value: 551 Ohm
Lead Channel Impedance Value: 608 Ohm
Lead Channel Impedance Value: 722 Ohm
Lead Channel Impedance Value: 855 Ohm
Lead Channel Pacing Threshold Amplitude: 0.5 V
Lead Channel Pacing Threshold Amplitude: 0.75 V
Lead Channel Pacing Threshold Amplitude: 1 V
Lead Channel Pacing Threshold Pulse Width: 0.4 ms
Lead Channel Pacing Threshold Pulse Width: 0.4 ms
Lead Channel Pacing Threshold Pulse Width: 1 ms
Lead Channel Sensing Intrinsic Amplitude: 3.625 mV
Lead Channel Sensing Intrinsic Amplitude: 8.75 mV
Lead Channel Setting Pacing Amplitude: 1.5 V
Lead Channel Setting Pacing Amplitude: 1.5 V
Lead Channel Setting Pacing Amplitude: 2 V
Lead Channel Setting Pacing Pulse Width: 0.4 ms
Lead Channel Setting Pacing Pulse Width: 1 ms
Lead Channel Setting Sensing Sensitivity: 2.8 mV

## 2019-07-27 ENCOUNTER — Ambulatory Visit (INDEPENDENT_AMBULATORY_CARE_PROVIDER_SITE_OTHER): Payer: Medicare PPO | Admitting: *Deleted

## 2019-07-27 DIAGNOSIS — I429 Cardiomyopathy, unspecified: Secondary | ICD-10-CM | POA: Diagnosis not present

## 2019-07-27 DIAGNOSIS — I5022 Chronic systolic (congestive) heart failure: Secondary | ICD-10-CM

## 2019-07-28 ENCOUNTER — Telehealth: Payer: Self-pay

## 2019-07-28 LAB — CUP PACEART REMOTE DEVICE CHECK
Battery Remaining Longevity: 58 mo
Battery Voltage: 3 V
Brady Statistic AP VP Percent: 31.27 %
Brady Statistic AP VS Percent: 0 %
Brady Statistic AS VP Percent: 67.86 %
Brady Statistic AS VS Percent: 0.86 %
Brady Statistic RA Percent Paced: 31.09 %
Brady Statistic RV Percent Paced: 98.68 %
Date Time Interrogation Session: 20210518170237
Implantable Lead Implant Date: 20121112
Implantable Lead Implant Date: 20121112
Implantable Lead Implant Date: 20121112
Implantable Lead Location: 753858
Implantable Lead Location: 753859
Implantable Lead Location: 753860
Implantable Lead Model: 4194
Implantable Lead Model: 5076
Implantable Lead Model: 6947
Implantable Pulse Generator Implant Date: 20171114
Lead Channel Impedance Value: 361 Ohm
Lead Channel Impedance Value: 380 Ohm
Lead Channel Impedance Value: 418 Ohm
Lead Channel Impedance Value: 437 Ohm
Lead Channel Impedance Value: 475 Ohm
Lead Channel Impedance Value: 532 Ohm
Lead Channel Impedance Value: 570 Ohm
Lead Channel Impedance Value: 665 Ohm
Lead Channel Impedance Value: 798 Ohm
Lead Channel Pacing Threshold Amplitude: 0.5 V
Lead Channel Pacing Threshold Amplitude: 0.75 V
Lead Channel Pacing Threshold Pulse Width: 0.4 ms
Lead Channel Pacing Threshold Pulse Width: 0.4 ms
Lead Channel Sensing Intrinsic Amplitude: 3.125 mV
Lead Channel Sensing Intrinsic Amplitude: 3.125 mV
Lead Channel Sensing Intrinsic Amplitude: 7.875 mV
Lead Channel Sensing Intrinsic Amplitude: 8.75 mV
Lead Channel Setting Pacing Amplitude: 1.5 V
Lead Channel Setting Pacing Amplitude: 1.5 V
Lead Channel Setting Pacing Amplitude: 2 V
Lead Channel Setting Pacing Pulse Width: 0.4 ms
Lead Channel Setting Pacing Pulse Width: 1 ms
Lead Channel Setting Sensing Sensitivity: 2.8 mV

## 2019-07-28 NOTE — Telephone Encounter (Signed)
Left message for patient to remind of missed remote transmission.  

## 2019-07-29 NOTE — Progress Notes (Signed)
Remote pacemaker transmission.   

## 2019-10-26 ENCOUNTER — Ambulatory Visit (INDEPENDENT_AMBULATORY_CARE_PROVIDER_SITE_OTHER): Payer: Medicare PPO | Admitting: *Deleted

## 2019-10-26 DIAGNOSIS — I5022 Chronic systolic (congestive) heart failure: Secondary | ICD-10-CM

## 2019-10-26 DIAGNOSIS — I429 Cardiomyopathy, unspecified: Secondary | ICD-10-CM

## 2019-10-28 LAB — CUP PACEART REMOTE DEVICE CHECK
Battery Remaining Longevity: 53 mo
Battery Voltage: 2.99 V
Brady Statistic AP VP Percent: 23.13 %
Brady Statistic AP VS Percent: 0.01 %
Brady Statistic AS VP Percent: 75.45 %
Brady Statistic AS VS Percent: 1.41 %
Brady Statistic RA Percent Paced: 22.96 %
Brady Statistic RV Percent Paced: 98.05 %
Date Time Interrogation Session: 20210818035212
Implantable Lead Implant Date: 20121112
Implantable Lead Implant Date: 20121112
Implantable Lead Implant Date: 20121112
Implantable Lead Location: 753858
Implantable Lead Location: 753859
Implantable Lead Location: 753860
Implantable Lead Model: 4194
Implantable Lead Model: 5076
Implantable Lead Model: 6947
Implantable Pulse Generator Implant Date: 20171114
Lead Channel Impedance Value: 361 Ohm
Lead Channel Impedance Value: 380 Ohm
Lead Channel Impedance Value: 437 Ohm
Lead Channel Impedance Value: 456 Ohm
Lead Channel Impedance Value: 494 Ohm
Lead Channel Impedance Value: 551 Ohm
Lead Channel Impedance Value: 608 Ohm
Lead Channel Impedance Value: 722 Ohm
Lead Channel Impedance Value: 855 Ohm
Lead Channel Pacing Threshold Amplitude: 0.5 V
Lead Channel Pacing Threshold Amplitude: 0.625 V
Lead Channel Pacing Threshold Pulse Width: 0.4 ms
Lead Channel Pacing Threshold Pulse Width: 0.4 ms
Lead Channel Sensing Intrinsic Amplitude: 4.375 mV
Lead Channel Sensing Intrinsic Amplitude: 4.375 mV
Lead Channel Sensing Intrinsic Amplitude: 7.875 mV
Lead Channel Sensing Intrinsic Amplitude: 8.75 mV
Lead Channel Setting Pacing Amplitude: 1.5 V
Lead Channel Setting Pacing Amplitude: 1.5 V
Lead Channel Setting Pacing Amplitude: 2 V
Lead Channel Setting Pacing Pulse Width: 0.4 ms
Lead Channel Setting Pacing Pulse Width: 1 ms
Lead Channel Setting Sensing Sensitivity: 2.8 mV

## 2019-11-03 NOTE — Progress Notes (Signed)
Remote pacemaker transmission.   

## 2019-11-09 DIAGNOSIS — I10 Essential (primary) hypertension: Secondary | ICD-10-CM | POA: Diagnosis not present

## 2019-11-09 DIAGNOSIS — Z7982 Long term (current) use of aspirin: Secondary | ICD-10-CM | POA: Diagnosis not present

## 2019-11-09 DIAGNOSIS — I252 Old myocardial infarction: Secondary | ICD-10-CM | POA: Diagnosis not present

## 2019-11-09 DIAGNOSIS — Z87891 Personal history of nicotine dependence: Secondary | ICD-10-CM | POA: Diagnosis not present

## 2019-11-09 DIAGNOSIS — Z95810 Presence of automatic (implantable) cardiac defibrillator: Secondary | ICD-10-CM | POA: Diagnosis not present

## 2019-11-09 DIAGNOSIS — J309 Allergic rhinitis, unspecified: Secondary | ICD-10-CM | POA: Diagnosis not present

## 2019-11-09 DIAGNOSIS — Z823 Family history of stroke: Secondary | ICD-10-CM | POA: Diagnosis not present

## 2019-11-09 DIAGNOSIS — E785 Hyperlipidemia, unspecified: Secondary | ICD-10-CM | POA: Diagnosis not present

## 2019-11-09 DIAGNOSIS — J449 Chronic obstructive pulmonary disease, unspecified: Secondary | ICD-10-CM | POA: Diagnosis not present

## 2019-11-09 DIAGNOSIS — Z91041 Radiographic dye allergy status: Secondary | ICD-10-CM | POA: Diagnosis not present

## 2019-11-09 DIAGNOSIS — R609 Edema, unspecified: Secondary | ICD-10-CM | POA: Diagnosis not present

## 2019-11-09 DIAGNOSIS — Z888 Allergy status to other drugs, medicaments and biological substances status: Secondary | ICD-10-CM | POA: Diagnosis not present

## 2019-11-09 DIAGNOSIS — E039 Hypothyroidism, unspecified: Secondary | ICD-10-CM | POA: Diagnosis not present

## 2019-11-18 DIAGNOSIS — Z20828 Contact with and (suspected) exposure to other viral communicable diseases: Secondary | ICD-10-CM | POA: Diagnosis not present

## 2019-12-22 ENCOUNTER — Ambulatory Visit: Payer: Medicare PPO | Admitting: Internal Medicine

## 2019-12-23 ENCOUNTER — Ambulatory Visit: Payer: Medicare PPO | Admitting: Internal Medicine

## 2019-12-31 NOTE — Progress Notes (Deleted)
HPI: FU dilated cardiomyopathy. She was in the hospital in March 2012 with severe hypothyroidism and CHF. Followup echocardiogram in June 2012 demonstrated continued LV dysfunction with an EF of 15%. Cardiac catheterization was performed 10/25/10: EF 10-15%, circumflex calcified without obstructive CAD, proximal LAD 20%, mid LAD 20%, proximal RCA 20%, mid RCA 20%. She was felt to have mild CAD and a nonischemic cardiomyopathy. Also with h/o BIV ICD. Echocardiogram September 2020 showed ejection fraction 55 to 60%, grade 1 diastolic dysfunction. Since I last saw her,   Current Outpatient Medications  Medication Sig Dispense Refill  . albuterol (PROVENTIL) (5 MG/ML) 0.5% nebulizer solution Take 0.5 mLs (2.5 mg total) by nebulization every 6 (six) hours as needed for wheezing or shortness of breath. 20 mL 11  . aspirin 81 MG EC tablet Take 81 mg by mouth daily.     . carvedilol (COREG) 25 MG tablet Take 1 tablet (25 mg total) by mouth 2 (two) times daily with a meal. 180 tablet 3  . Cholecalciferol (VITAMIN D3) 1000 UNITS CAPS Take 1,000 Units by mouth daily.     . fexofenadine (ALLEGRA) 180 MG tablet Take 180 mg by mouth daily.      . furosemide (LASIX) 40 MG tablet Take 1 tablet (40 mg total) by mouth daily. 90 tablet 1  . hydrocortisone cream 1 % Apply 1 application topically 3 (three) times daily as needed for itching.    . levothyroxine (SYNTHROID) 100 MCG tablet Take 2 tablets (200 mcg total) by mouth daily. 180 tablet 3  . levothyroxine (SYNTHROID, LEVOTHROID) 25 MCG tablet Take 1 tablet (25 mcg total) by mouth daily before breakfast. In addition to the 200 mcg 90 tablet 3  . losartan (COZAAR) 50 MG tablet Take 1 tablet (50 mg total) by mouth daily. 90 tablet 3  . rosuvastatin (CRESTOR) 20 MG tablet Take 1 tablet (20 mg total) by mouth daily. 90 tablet 3  . spironolactone (ALDACTONE) 50 MG tablet Take 1 tablet (50 mg total) by mouth daily. 90 tablet 3  . tiotropium (SPIRIVA) 18 MCG  inhalation capsule Place 1 capsule (18 mcg total) into inhaler and inhale every morning. 90 capsule 3  . triamcinolone (NASACORT) 55 MCG/ACT AERO nasal inhaler Place 1 spray into the nose daily. 1 Inhaler 11   No current facility-administered medications for this visit.     Past Medical History:  Diagnosis Date  . ALLERGIC RHINITIS 03/17/2008  . Anemia, iron deficiency   . Blood transfusion complicating pregnancy    after first child at 52 yo  . CAD (coronary artery disease)    Nonobstructive by cardiac catheter 8/12:EF 10-15%, circumflex calcified without obstructive CAD, proximal LAD 20%, mid LAD 20%, proximal RCA 20%, mid RCA 20%.  . Cardiomyopathy    a. remote h/o cath with reported nonobs CAD; b. echo 5/12: EF 15%, mild LVH, mild MR, LAE, mod pericardial effusion with increased filling pressures  . Cervical disc disease    with recurrent radicular pain, Dr. Montez Morita  . COPD (chronic obstructive pulmonary disease) (HCC) 06/14/2010  . DJD (degenerative joint disease)    hands  . Genital warts    hx  . GOITER, MULTINODULAR 12/23/2009  . Hyperlipidemia 11/23/2015  . Hypertension   . Hypothyroid   . Myocardial infarction (HCC)   . Systolic CHF [facility])     Past Surgical History:  Procedure Laterality Date  . CARDIAC CATHETERIZATION  1995   with "timy blockage" [facility] Dr. Daisy Floro  .  CARDIAC DEFIBRILLATOR PLACEMENT  01-22-11   by Dr.Taylor  . CHOLECYSTECTOMY    . EP IMPLANTABLE DEVICE N/A 01/24/2016   Procedure: ICD Generator Changeout;  Surgeon: Marinus Maw, MD;  Location: [facility] INVASIVE CV LAB;  Service: Cardiovascular;  Laterality: N/A;  . IMPLANTABLE CARDIOVERTER DEFIBRILLATOR IMPLANT N/A 01/22/2011   Procedure: IMPLANTABLE CARDIOVERTER DEFIBRILLATOR IMPLANT;  Surgeon: Marinus Maw, MD;  Location: [facility] CATH LAB;  Service: Cardiovascular;  Laterality: N/A;    Social History   Socioeconomic History  . Marital status: Divorced    Spouse name: Not on file  . Number of  children: 3  . Years of education: Not on file  . Highest education level: Not on file  Occupational History  . 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 Smoking status: Former Smoker    Packs/day: 0.00  . Smokeless tobacco: Never Used  Vaping Use  . Vaping Use: Never used  Substance and Sexual Activity  . Alcohol use: No    Alcohol/week: 0.0 standard drinks  . Drug use: No  . Sexual activity: Never  Other Topics Concern  . Not on file  Social History Narrative  . Not on file   Social Determinants of Health   Financial Resource Strain:   . Difficulty of Paying Living Expenses: Not on file  Food Insecurity:   . Worried About Programme researcher, broadcasting/film/video in the Last Year: Not on file  . Ran Out of Food in the Last Year: Not on file  Transportation Needs:   . Lack of Transportation (Medical): Not on file  . Lack of Transportation (Non-Medical): Not on file  Physical Activity: Unknown  . Days of Exercise per Week: Not on file  . Minutes of Exercise per Session: 30 min  Stress:   . Feeling of Stress : Not on file  Social Connections:   . Frequency of Communication with Friends and Family: Not on file  . Frequency of Social Gatherings with Friends and Family: Not on file  . Attends Religious Services: Not on file  . Active Member of Clubs or Organizations: Not on file  . Attends Banker Meetings: Not on file  . Marital Status: Not on file  Intimate Partner Violence:   . Fear of Current or Ex-Partner: Not on file  . Emotionally Abused: Not on file  . Physically Abused: Not on file  . Sexually Abused: Not on file    Family History  Problem Relation Age of Onset  . Stroke Mother   . Heart disease Mother   . Hypertension Mother   . Diabetes Mother   . Stroke Father   . Heart disease Father   . Hypertension Father   . Diabetes Father   . Cancer Sister        colon  . Heart disease Sister        pacemaker  . Hypertension Sister   .  Depression Sister   . Diabetes Sister   . Thyroid disease Sister        uncertain type  . Transient ischemic attack Maternal Grandfather   . Alcohol abuse Other   . Malignant hyperthermia Other     ROS: no fevers or chills, productive cough, hemoptysis, dysphasia, odynophagia, melena, hematochezia, dysuria, hematuria, rash, seizure activity, orthopnea, PND, pedal edema, claudication. Remaining systems are negative.  Physical Exam: Well-developed well-nourished in no acute distress.  Skin is warm and dry.  HEENT is normal.  Neck  is supple.  Chest is clear to auscultation with normal expansion.  Cardiovascular exam is regular rate and rhythm.  Abdominal exam nontender or distended. No masses palpated. Extremities show no edema. neuro grossly intact  ECG- personally reviewed  A/P  1 nonischemic cardiomyopathy-LV function has improved on most recent echocardiogram.  Continue ARB and beta-blocker.  2 hypertension-patient's blood pressure is controlled today.  Continue present medical regimen.  Check potassium and renal function.  3 hyperlipidemia-continue statin.  Check lipids and liver.  4 coronary artery disease-continue medical therapy with aspirin and statin.  Patient denies chest pain.  5 chronic combined systolic/diastolic congestive heart failure-patient is euvolemic on examination today.  Continue diuretics at present dose.  6 biventricular ICD-followed by Dr. Ladona Ridgel.  Olga Millers, MD

## 2020-01-06 ENCOUNTER — Ambulatory Visit: Payer: Medicare PPO | Admitting: Cardiology

## 2020-01-12 ENCOUNTER — Ambulatory Visit: Payer: Self-pay

## 2020-01-12 ENCOUNTER — Ambulatory Visit (INDEPENDENT_AMBULATORY_CARE_PROVIDER_SITE_OTHER): Payer: Medicare PPO

## 2020-01-12 DIAGNOSIS — Z0000 Encounter for general adult medical examination without abnormal findings: Secondary | ICD-10-CM

## 2020-01-12 NOTE — Progress Notes (Signed)
I connected with [name] today by telephone and verified that I am speaking with the correct person using two identifiers. Location patient: home Location provider: work Persons participating in the virtual visit: [name] and Susie Cassette, LPN.   I discussed the limitations, risks, security and privacy concerns of performing an evaluation and management service by telephone and the availability of in person appointments. I also discussed with the patient that there may be a patient responsible charge related to this service. The patient expressed understanding and verbally consented to this telephonic visit.    Interactive audio and video telecommunications were attempted between this provider and patient, however failed, due to patient having technical difficulties OR patient did not have access to video capability.  We continued and completed visit with audio only.  Some vital signs may be absent or patient reported.   Time Spent with patient on telephone encounter: 30 minutes  Subjective:   [name] is a 69 y.o. female who presents for Medicare Annual (Subsequent) preventive examination.  Review of Systems    No ROS. Medicare Wellness Visit. Cardiac Risk Factors include: advanced age (>58men, >66 women);dyslipidemia;family history of premature cardiovascular disease;hypertension Sleep Patterns: No sleep issues, feels rested on waking and sleeps 8 hours nightly. Home Safety/Smoke Alarms: Feels safe in home; uses home alarm. Smoke alarms in place. Living environment: 1-story home; Lives with daughter and grandson; no needs for DME; good support system. Seat Belt Safety/Bike Helmet: Wears seat belt.    Objective:    There were no vitals filed for this visit. There is no height or weight on file to calculate BMI.  Advanced Directives 01/12/2020 01/06/2019 12/18/2017 12/18/2016 01/24/2016 01/22/2011 01/22/2011  Does Patient Have a Medical Advance Directive? Yes  Yes Yes Yes Yes Patient has advance directive, copy not in chart Patient does not have advance directive  Type of Advance Directive Healthcare Power of [place];Living will Healthcare Power of [place];Living will Healthcare Power of [place];Living will Healthcare Power of [place];Living will - Healthcare Power of Attorney -  Does patient want to make changes to medical advance directive? No - Patient declined - - - - - -  Copy of Healthcare Power of Attorney in Chart? No - copy requested No - copy requested No - copy requested No - copy requested No - copy requested Copy requested from family -  Pre-existing out of facility DNR order (yellow form or pink MOST form) - - - - - No -    Current Medications (verified) Outpatient Encounter Medications as of 01/12/2020  Medication Sig  . albuterol (PROVENTIL) (5 MG/ML) 0.5% nebulizer solution Take 0.5 mLs (2.5 mg total) by nebulization every 6 (six) hours as needed for wheezing or shortness of breath.  Marland Kitchen aspirin 81 MG EC tablet Take 81 mg by mouth daily.   . carvedilol (COREG) 25 MG tablet Take 1 tablet (25 mg total) by mouth 2 (two) times daily with a meal.  . Cholecalciferol (VITAMIN D3) 1000 UNITS CAPS Take 1,000 Units by mouth daily.   . fexofenadine (ALLEGRA) 180 MG tablet Take 180 mg by mouth daily.    . furosemide (LASIX) 40 MG tablet Take 1 tablet (40 mg total) by mouth daily.  . hydrocortisone cream 1 % Apply 1 application topically 3 (three) times daily as needed for itching.  . levothyroxine (SYNTHROID) 100 MCG tablet Take 2 tablets (200 mcg total) by mouth daily.  Marland Kitchen levothyroxine (SYNTHROID, LEVOTHROID) 25 MCG tablet Take 1 tablet (25 mcg total) by  mouth daily before breakfast. In addition to the 200 mcg  . losartan (COZAAR) 50 MG tablet Take 1 tablet (50 mg total) by mouth daily.  . rosuvastatin (CRESTOR) 20 MG tablet Take 1 tablet (20 mg total) by mouth daily.  Marland Kitchen spironolactone (ALDACTONE) 50 MG tablet Take 1 tablet (50 mg total) by  mouth daily.  Marland Kitchen tiotropium (SPIRIVA) 18 MCG inhalation capsule Place 1 capsule (18 mcg total) into inhaler and inhale every morning.  . triamcinolone (NASACORT) 55 MCG/ACT AERO nasal inhaler Place 1 spray into the nose daily.   No facility-administered encounter medications on file as of 01/12/2020.    Allergies (verified) Ivp dye [iodinated diagnostic agents], Latex, Penicillins, and Strawberry extract   History: Past Medical History:  Diagnosis Date  . ALLERGIC RHINITIS 03/17/2008  . Anemia, iron deficiency   . Blood transfusion complicating pregnancy    after first child at 26 yo  . CAD (coronary artery disease)    Nonobstructive by cardiac catheter 8/12:EF 10-15%, circumflex calcified without obstructive CAD, proximal LAD 20%, mid LAD 20%, proximal RCA 20%, mid RCA 20%.  . Cardiomyopathy    a. remote h/o cath with reported nonobs CAD; b. echo 5/12: EF 15%, mild LVH, mild MR, LAE, mod pericardial effusion with increased filling pressures  . Cervical disc disease    with recurrent radicular pain, Dr. Montez Morita  . COPD (chronic obstructive pulmonary disease) (HCC) 06/14/2010  . DJD (degenerative joint disease)    hands  . Genital warts    hx  . GOITER, MULTINODULAR 12/23/2009  . Hyperlipidemia 11/23/2015  . Hypertension   . Hypothyroid   . Myocardial infarction (HCC)   . Systolic CHF [facility])    Past Surgical History:  Procedure Laterality Date  . CARDIAC CATHETERIZATION  1995   with "timy blockage" [facility] Dr. Daisy Floro  . CARDIAC DEFIBRILLATOR PLACEMENT  01-22-11   by Dr.Taylor  . CHOLECYSTECTOMY    . EP IMPLANTABLE DEVICE N/A 01/24/2016   Procedure: ICD Generator Changeout;  Surgeon: Marinus Maw, MD;  Location[facility] INVASIVE CV LAB;  Service: Cardiovascular;  Laterality: N/A;  . IMPLANTABLE CARDIOVERTER DEFIBRILLATOR IMPLANT N/A 01/22/2011   Procedure: IMPLANTABLE CARDIOVERTER DEFIBRILLATOR IMPLANT;  Surgeon: Marinus Maw, MD;  Location: [facility] CATH LAB;  Service:  Cardiovascular;  Laterality: N/A;   Family History  Problem Relation Age of Onset  . Stroke Mother   . Heart disease Mother   . Hypertension Mother   . Diabetes Mother   . Stroke Father   . Heart disease Father   . Hypertension Father   . Diabetes Father   . Cancer Sister        colon  . Heart disease Sister        pacemaker  . Hypertension Sister   . Depression Sister   . Diabetes Sister   . Thyroid disease Sister        uncertain type  . Transient ischemic attack Maternal Grandfather   . Alcohol abuse Other   . Malignant hyperthermia Other    Social History   Socioeconomic History  . Marital status: Divorced    Spouse name: Not on file  . Number of children: 3  . Years of education: Not on file  . Highest education level: Not on file  Occupational History  . Occupation: Retired    Associate Professor: Advice worker [employer]  Tobacco Use  . Smoking status: Former Smoker    Packs/day: 0.00  . Smokeless tobacco: Never Used  Vaping Use  .  Vaping Use: Never used  Substance and Sexual Activity  . Alcohol use: No    Alcohol/week: 0.0 standard drinks  . Drug use: No  . Sexual activity: Never  Other Topics Concern  . Not on file  Social History Narrative  . Not on file   Social Determinants of Health   Financial Resource Strain: Low Risk   . Difficulty of Paying Living Expenses: Not hard at all  Food Insecurity: No Food Insecurity  . Worried About Programme researcher, broadcasting/film/video in the Last Year: Never true  . Ran Out of Food in the Last Year: Never true  Transportation Needs: No Transportation Needs  . Lack of Transportation (Medical): No  . Lack of Transportation (Non-Medical): No  Physical Activity: Sufficiently Active  . Days of Exercise per Week: 5 days  . Minutes of Exercise per Session: 30 min  Stress: No Stress Concern Present  . Feeling of Stress : Not at all  Social Connections: Socially Integrated  . Frequency of Communication with Friends and Family: More than three  times a week  . Frequency of Social Gatherings with Friends and Family: More than three times a week  . Attends Religious Services: More than 4 times per year  . Active Member of Clubs or Organizations: Yes  . Attends Banker Meetings: More than 4 times per year  . Marital Status: Married    Tobacco Counseling Counseling given: Not Answered   Clinical Intake:  Pre-visit preparation completed: Yes  Pain : No/denies pain     Nutritional Risks: None Diabetes: No  How often do you need to have someone help you when you read instructions, pamphlets, or other written materials from your doctor or pharmacy?: 1 - Never What is the last grade level you completed in school?: Associate's degree  Diabetic? no  Interpreter Needed?: No  Information entered by :: Susie Cassette, LPN   Activities of Daily Living In your present state of health, do you have any difficulty performing the following activities: 01/12/2020  Hearing? N  Vision? N  Difficulty concentrating or making decisions? N  Walking or climbing stairs? N  Dressing or bathing? N  Doing errands, shopping? N  Preparing Food and eating ? N  Using the Toilet? N  In the past six months, have you accidently leaked urine? N  Do you have problems with loss of bowel control? N  Managing your Medications? N  Managing your Finances? N  Housekeeping or managing your Housekeeping? N  Some recent data might be hidden    Patient Care Team: Corwin Levins, MD as PCP - General Marinus Maw, MD as Consulting Physician (Cardiology) Dione Booze, Bertram Millard, MD as Consulting Physician (Ophthalmology)  Indicate any recent Medical Services you may have received from other than [facility] providers in the past year (date may be approximate).     Assessment:   This is a routine wellness examination for Anessia.  Hearing/Vision screen No exam data present  Dietary issues and exercise activities discussed: Current  Exercise Habits: Home exercise routine, Type of exercise: walking;Other - see comments ([place], step master and walking twice a day), Time (Minutes): 30, Frequency (Times/Week): 5, Weekly Exercise (Minutes/Week): 150, Intensity: Moderate, Exercise limited by: cardiac condition(s);respiratory conditions(s)  Goals    . Maintain current health status     Continue to exercise, eat healthy, worship God, enjoy life, and family. I     . Patient Stated     I would like to lose  weight and get down to my ideal weight of 150 pounds.      Depression Screen PHQ 2/9 Scores 01/12/2020 01/06/2019 07/14/2018 12/18/2017 12/10/2017 12/18/2016 12/04/2016  PHQ - 2 Score 0 0 0 0 0 0 0  PHQ- 9 Score - - - - - 0 -    Fall Risk Fall Risk  01/12/2020 01/06/2019 07/14/2018 12/18/2017 12/10/2017  Falls in the past year? 0 0 0 No No  Number falls in past yr: 0 0 - - -  Injury with Fall? 0 0 - - -  Risk for fall due to : No Fall Risks - - - -  Follow up Falls evaluation completed - - - -    Any stairs in or around the home? No  If so, are there any without handrails? No  Home free of loose throw rugs in walkways, pet beds, electrical cords, etc? Yes  Adequate lighting in your home to reduce risk of falls? Yes   ASSISTIVE DEVICES UTILIZED TO PREVENT FALLS:  Life alert? No  Use of a cane, walker or w/c? No  Grab bars in the bathroom? Yes  Shower chair or bench in shower? No  Elevated toilet seat or a handicapped toilet? Yes   TIMED UP AND GO:  Was the test performed? No .  Length of time to ambulate 10 feet: 0 sec.   Gait steady and fast without use of assistive device  Cognitive Function: MMSE - Mini Mental State Exam 12/18/2016  Orientation to time 5  Orientation to Place 5  Registration 3  Attention/ Calculation 5  Recall 2  Language- name 2 objects 2  Language- repeat 1  Language- follow 3 step command 3  Language- read & follow direction 1  Write a sentence 1  Copy design 1  Total score 29         Immunizations Immunization History  Administered Date(s) Administered  . Fluad Quad(high Dose 65+) 01/29/2019  . Influenza Split 12/12/2011  . Influenza, High Dose Seasonal PF 12/04/2016, 12/10/2017  . Influenza,inj,Quad PF,6+ Mos 11/23/2015  . Moderna SARS-COVID-2 Vaccination 05/30/2019, 06/30/2019  . Pneumococcal Conjugate-13 06/02/2014  . Pneumococcal Polysaccharide-23 11/23/2015  . Td 11/15/2009    TDAP status: Due, Education has been provided regarding the importance of this vaccine. Advised may receive this vaccine at local pharmacy or Health Dept. Aware to provide a copy of the vaccination record if obtained from local pharmacy or Health Dept. Verbalized acceptance and understanding. Flu Vaccine status: Up to date  (will get at next office visit) Pneumococcal vaccine status: Up to date Covid-19 vaccine status: Completed vaccines  Qualifies for Shingles Vaccine? Yes   Zostavax completed No   Shingrix Completed?: No.    Education has been provided regarding the importance of this vaccine. Patient has been advised to call insurance company to determine out of pocket expense if they have not yet received this vaccine. Advised may also receive vaccine at local pharmacy or Health Dept. Verbalized acceptance and understanding.  Screening Tests Health Maintenance  Topic Date Due  . INFLUENZA VACCINE  10/11/2019  . TETANUS/TDAP  11/16/2019  . COLONOSCOPY  11/07/2027  . DEXA SCAN  Completed  . COVID-19 Vaccine  Completed  . Hepatitis C Screening  Completed  . PNA vac Low Risk Adult  Completed    Health Maintenance  Health Maintenance Due  Topic Date Due  . INFLUENZA VACCINE  10/11/2019  . TETANUS/TDAP  11/16/2019    Colorectal cancer screening: Completed 11/06/2017. Repeat every  3 years (Cologuard testing ordered by [facility] nurse) Mammogram status: No longer required.  Bone Density status: Completed 12/18/2016. Results reflect: Bone density results: OSTEOPENIA. Repeat every 23  years.  Lung Cancer Screening: (Low Dose CT Chest recommended if Age 70-80 years, 30 pack-year currently smoking OR have quit w/in 15years.) does not qualify.   Lung Cancer Screening Referral: no  Additional Screening:  Hepatitis C Screening: does qualify; Completed yes  Vision Screening: Recommended annual ophthalmology exams for early detection of glaucoma and other disorders of the eye. Is the patient up to date with their annual eye exam?  Yes  Who is the provider or what is the name of the office in which the patient attends annual eye exams? [facility] If pt is not established with a provider, would they like to be referred to a provider to establish care? No .   Dental Screening: Recommended annual dental exams for proper oral hygiene  Community Resource Referral / Chronic Care Management: CRR required this visit?  No   CCM required this visit?  No      Plan:     I have personally reviewed and noted the following in the patient's chart:   . Medical and social history . Use of alcohol, tobacco or illicit drugs  . Current medications and supplements . Functional ability and status . Nutritional status . Physical activity . Advanced directives . List of other physicians . Hospitalizations, surgeries, and ER visits in previous 12 months . Vitals . Screenings to include cognitive, depression, and falls . Referrals and appointments  In addition, I have reviewed and discussed with patient certain preventive protocols, quality metrics, and best practice recommendations. A written personalized care plan for preventive services as well as general preventive health recommendations were provided to patient.     Mickeal Needy, LPN   [id number]   Nurse Notes:  Patient is cogitatively intact. There were no vitals filed for this visit. There is no height or weight on file to calculate BMI. Patient stated that she has no issues with gait or balance; does not  use any assistive devices. Patient will speak with primary care at next visit regarding Cologuard.

## 2020-01-12 NOTE — Patient Instructions (Addendum)
[name] , Thank you for taking time to come for your Medicare Wellness Visit. I appreciate your ongoing commitment to your health goals. Please review the following plan we discussed and let me know if I can assist you in the future.   Screening recommendations/referrals: Colonoscopy: 11/06/2017; every 3 years Mammogram: never done Bone Density: 12/18/2016 Recommended yearly ophthalmology/optometry visit for glaucoma screening and checkup Recommended yearly dental visit for hygiene and checkup  Vaccinations: Influenza vaccine: 01/29/2019 Pneumococcal vaccine: up to date Tdap vaccine: overdue Shingles vaccine: never done   Covid-19: up to date   Advanced directives: Please bring a copy of your health care power of attorney and living will to the office at your convenience.  Conditions/risks identified:  Yes; Reviewed health maintenance screenings with patient today and relevant education, vaccines, and/or referrals were provided. Please continue to do your personal lifestyle choices by: daily care of teeth and gums, regular physical activity (goal should be 5 days a week for 30 minutes), eat a healthy diet, avoid tobacco and drug use, limiting any alcohol intake, taking a low-dose aspirin (if not allergic or have been advised by your provider otherwise) and taking vitamins and minerals as recommended by your provider. Continue doing brain stimulating activities (puzzles, reading, adult coloring books, staying active) to keep memory sharp. Continue to eat heart healthy diet (full of fruits, vegetables, whole grains, lean protein, water--limit salt, fat, and sugar intake) and increase physical activity as tolerated.  Next appointment: Please schedule your next Medicare Wellness Visit with your Nurse Health Advisor.   Preventive Care 5 Years and Older, Female Preventive care refers to lifestyle choices and visits with your health care provider that can promote health and wellness. What does  preventive care include?  A yearly physical exam. This is also called an annual well check.  Dental exams once or twice a year.  Routine eye exams. Ask your health care provider how often you should have your eyes checked.  Personal lifestyle choices, including:  Daily care of your teeth and gums.  Regular physical activity.  Eating a healthy diet.  Avoiding tobacco and drug use.  Limiting alcohol use.  Practicing safe sex.  Taking low-dose aspirin every day.  Taking vitamin and mineral supplements as recommended by your health care provider. What happens during an annual well check? The services and screenings done by your health care provider during your annual well check will depend on your age, overall health, lifestyle risk factors, and family history of disease. Counseling  Your health care provider may ask you questions about your:  Alcohol use.  Tobacco use.  Drug use.  Emotional well-being.  Home and relationship well-being.  Sexual activity.  Eating habits.  History of falls.  Memory and ability to understand (cognition).  Work and work Astronomer.  Reproductive health. Screening  You may have the following tests or measurements:  Height, weight, and BMI.  Blood pressure.  Lipid and cholesterol levels. These may be checked every 5 years, or more frequently if you are over 55 years old.  Skin check.  Lung cancer screening. You may have this screening every year starting at age 57 if you have a 30-pack-year history of smoking and currently smoke or have quit within the past 15 years.  Fecal occult blood test (FOBT) of the stool. You may have this test every year starting at age 41.  Flexible sigmoidoscopy or colonoscopy. You may have a sigmoidoscopy every 5 years or a colonoscopy every 10 years starting  at age 77.  Hepatitis C blood test.  Hepatitis B blood test.  Sexually transmitted disease (STD) testing.  Diabetes screening. This  is done by checking your blood sugar (glucose) after you have not eaten for a while (fasting). You may have this done every 1-3 years.  Bone density scan. This is done to screen for osteoporosis. You may have this done starting at age 52.  Mammogram. This may be done every 1-2 years. Talk to your health care provider about how often you should have regular mammograms. Talk with your health care provider about your test results, treatment options, and if necessary, the need for more tests. Vaccines  Your health care provider may recommend certain vaccines, such as:  Influenza vaccine. This is recommended every year.  Tetanus, diphtheria, and acellular pertussis (Tdap, Td) vaccine. You may need a Td booster every 10 years.  Zoster vaccine. You may need this after age 15.  Pneumococcal 13-valent conjugate (PCV13) vaccine. One dose is recommended after age 33.  Pneumococcal polysaccharide (PPSV23) vaccine. One dose is recommended after age 85. Talk to your health care provider about which screenings and vaccines you need and how often you need them. This information is not intended to replace advice given to you by your health care provider. Make sure you discuss any questions you have with your health care provider. Document Released: 03/25/2015 Document Revised: 11/16/2015 Document Reviewed: 12/28/2014 Elsevier Interactive Patient Education  2017 [place] Prevention in the Home Falls can cause injuries. They can happen to people of all ages. There are many things you can do to make your home safe and to help prevent falls. What can I do on the outside of my home?  Regularly fix the edges of walkways and driveways and fix any cracks.  Remove anything that might make you trip as you walk through a door, such as a raised step or threshold.  Trim any bushes or trees on the path to your home.  Use bright outdoor lighting.  Clear any walking paths of anything that might make  someone trip, such as rocks or tools.  Regularly check to see if handrails are loose or broken. Make sure that both sides of any steps have handrails.  Any raised decks and porches should have guardrails on the edges.  Have any leaves, snow, or ice cleared regularly.  Use sand or salt on walking paths during winter.  Clean up any spills in your garage right away. This includes oil or grease spills. What can I do in the bathroom?  Use night lights.  Install grab bars by the toilet and in the tub and shower. Do not use towel bars as grab bars.  Use non-skid mats or decals in the tub or shower.  If you need to sit down in the shower, use a plastic, non-slip stool.  Keep the floor dry. Clean up any water that spills on the floor as soon as it happens.  Remove soap buildup in the tub or shower regularly.  Attach bath mats securely with double-sided non-slip rug tape.  Do not have throw rugs and other things on the floor that can make you trip. What can I do in the bedroom?  Use night lights.  Make sure that you have a light by your bed that is easy to reach.  Do not use any sheets or blankets that are too big for your bed. They should not hang down onto the floor.  Have a firm  chair that has side arms. You can use this for support while you get dressed.  Do not have throw rugs and other things on the floor that can make you trip. What can I do in the kitchen?  Clean up any spills right away.  Avoid walking on wet floors.  Keep items that you use a lot in easy-to-reach places.  If you need to reach something above you, use a strong step stool that has a grab bar.  Keep electrical cords out of the way.  Do not use floor polish or wax that makes floors slippery. If you must use wax, use non-skid floor wax.  Do not have throw rugs and other things on the floor that can make you trip. What can I do with my stairs?  Do not leave any items on the stairs.  Make sure that  there are handrails on both sides of the stairs and use them. Fix handrails that are broken or loose. Make sure that handrails are as long as the stairways.  Check any carpeting to make sure that it is firmly attached to the stairs. Fix any carpet that is loose or worn.  Avoid having throw rugs at the top or bottom of the stairs. If you do have throw rugs, attach them to the floor with carpet tape.  Make sure that you have a light switch at the top of the stairs and the bottom of the stairs. If you do not have them, ask someone to add them for you. What else can I do to help prevent falls?  Wear shoes that:  Do not have high heels.  Have rubber bottoms.  Are comfortable and fit you well.  Are closed at the toe. Do not wear sandals.  If you use a stepladder:  Make sure that it is fully opened. Do not climb a closed stepladder.  Make sure that both sides of the stepladder are locked into place.  Ask someone to hold it for you, if possible.  Clearly mark and make sure that you can see:  Any grab bars or handrails.  First and last steps.  Where the edge of each step is.  Use tools that help you move around (mobility aids) if they are needed. These include:  Canes.  Walkers.  Scooters.  Crutches.  Turn on the lights when you go into a dark area. Replace any light bulbs as soon as they burn out.  Set up your furniture so you have a clear path. Avoid moving your furniture around.  If any of your floors are uneven, fix them.  If there are any pets around you, be aware of where they are.  Review your medicines with your doctor. Some medicines can make you feel dizzy. This can increase your chance of falling. Ask your doctor what other things that you can do to help prevent falls. This information is not intended to replace advice given to you by your health care provider. Make sure you discuss any questions you have with your health care provider. Document Released:  12/23/2008 Document Revised: 08/04/2015 Document Reviewed: 04/02/2014 Elsevier Interactive Patient Education  2017 [employer].

## 2020-01-18 ENCOUNTER — Ambulatory Visit: Payer: Self-pay

## 2020-01-21 ENCOUNTER — Ambulatory Visit: Payer: Medicare PPO

## 2020-01-25 ENCOUNTER — Ambulatory Visit (INDEPENDENT_AMBULATORY_CARE_PROVIDER_SITE_OTHER): Payer: Medicare PPO

## 2020-01-25 DIAGNOSIS — I429 Cardiomyopathy, unspecified: Secondary | ICD-10-CM | POA: Diagnosis not present

## 2020-01-27 ENCOUNTER — Ambulatory Visit: Payer: Medicare PPO

## 2020-01-27 LAB — CUP PACEART REMOTE DEVICE CHECK
Battery Remaining Longevity: 55 mo
Battery Voltage: 2.99 V
Brady Statistic AP VP Percent: 25.94 %
Brady Statistic AP VS Percent: 0.01 %
Brady Statistic AS VP Percent: 73.63 %
Brady Statistic AS VS Percent: 0.43 %
Brady Statistic RA Percent Paced: 25.9 %
Brady Statistic RV Percent Paced: 99.47 %
Date Time Interrogation Session: 20211116175531
Implantable Lead Implant Date: 20121112
Implantable Lead Implant Date: 20121112
Implantable Lead Implant Date: 20121112
Implantable Lead Location: 753858
Implantable Lead Location: 753859
Implantable Lead Location: 753860
Implantable Lead Model: 4194
Implantable Lead Model: 5076
Implantable Lead Model: 6947
Implantable Pulse Generator Implant Date: 20171114
Lead Channel Impedance Value: 361 Ohm
Lead Channel Impedance Value: 399 Ohm
Lead Channel Impedance Value: 437 Ohm
Lead Channel Impedance Value: 456 Ohm
Lead Channel Impedance Value: 494 Ohm
Lead Channel Impedance Value: 551 Ohm
Lead Channel Impedance Value: 570 Ohm
Lead Channel Impedance Value: 684 Ohm
Lead Channel Impedance Value: 817 Ohm
Lead Channel Pacing Threshold Amplitude: 0.5 V
Lead Channel Pacing Threshold Amplitude: 0.75 V
Lead Channel Pacing Threshold Pulse Width: 0.4 ms
Lead Channel Pacing Threshold Pulse Width: 0.4 ms
Lead Channel Sensing Intrinsic Amplitude: 3.375 mV
Lead Channel Sensing Intrinsic Amplitude: 3.375 mV
Lead Channel Sensing Intrinsic Amplitude: 7.875 mV
Lead Channel Sensing Intrinsic Amplitude: 8.75 mV
Lead Channel Setting Pacing Amplitude: 1.5 V
Lead Channel Setting Pacing Amplitude: 1.5 V
Lead Channel Setting Pacing Amplitude: 2 V
Lead Channel Setting Pacing Pulse Width: 0.4 ms
Lead Channel Setting Pacing Pulse Width: 1 ms
Lead Channel Setting Sensing Sensitivity: 2.8 mV

## 2020-01-27 NOTE — Progress Notes (Signed)
Remote pacemaker transmission.   

## 2020-02-18 ENCOUNTER — Encounter: Payer: Self-pay | Admitting: Cardiology

## 2020-02-18 ENCOUNTER — Telehealth (INDEPENDENT_AMBULATORY_CARE_PROVIDER_SITE_OTHER): Payer: Medicare PPO | Admitting: Cardiology

## 2020-02-18 VITALS — Ht 65.0 in | Wt 166.6 lb

## 2020-02-18 DIAGNOSIS — I2510 Atherosclerotic heart disease of native coronary artery without angina pectoris: Secondary | ICD-10-CM

## 2020-02-18 DIAGNOSIS — I428 Other cardiomyopathies: Secondary | ICD-10-CM

## 2020-02-18 DIAGNOSIS — Z95810 Presence of automatic (implantable) cardiac defibrillator: Secondary | ICD-10-CM

## 2020-02-18 DIAGNOSIS — I10 Essential (primary) hypertension: Secondary | ICD-10-CM

## 2020-02-18 NOTE — Patient Instructions (Addendum)
Medication Instructions:  Your physician recommends that you continue on your current medications as directed. Please refer to the Current Medication list given to you today.  *If you need a refill on your cardiac medications before your next appointment, please call your pharmacy*  Lab Work: CMET WHEN YOU GO FOR YOUR LABS AT DR Melvyn Novas  If you have labs (blood work) drawn today and your tests are completely normal, you will receive your results only by: Marland Kitchen MyChart Message (if you have MyChart) OR . A paper copy in the mail If you have any lab test that is abnormal or we need to change your treatment, we will call you to review the results.  Testing/Procedures: NONE   Follow-Up: 08/23/2020 AT 8:40 AM IN PERSON WITH DR Jens Som

## 2020-02-18 NOTE — Progress Notes (Signed)
Virtual Visit via Video Note   This visit type was conducted due to national recommendations for restrictions regarding the COVID-19 Pandemic (e.g. social distancing) in an effort to limit this patient's exposure and mitigate transmission in our community.  Due to her co-morbid illnesses, this patient is at least at moderate risk for complications without adequate follow up.  This format is felt to be most appropriate for this patient at this time.  All issues noted in this document were discussed and addressed.  A limited physical exam was performed with this format.  Please refer to the patient's chart for her consent to telehealth for [facility].       Date:  02/18/2020   ID:  [name], DOB 11-21-1950, MRN [id number] The patient was identified using 2 identifiers.  Patient Location: Home Provider Location: Home Office  PCP:  Corwin Levins, MD  Cardiologist:  Dr Jens Som Electrophysiologist:  Dr Ladona Ridgel  Evaluation Performed:  Follow-Up Visit  Chief Complaint:  none  History of Present Illness:    [name] is a pleasant 70 y.o. female with a history of nonischemic cardiomyopathy.  Her most recent echocardiogram in September 2020 showed an ejection fraction of 55 to 60%.  She did have a catheterization done in August 2012 that showed nonobstructive coronary disease.  She does have a BiV ICD, she is followed by Dr. Ladona Ridgel.  She was contacted today for routine follow-up.  Since we saw her last she is done well from a cardiac standpoint.  She denies any unusual chest pain or shortness of breath.  She is currently having no issues with her medications.  She is followed closely by her PCP, she is due to have labs and these are scheduled.  The patient does not have symptoms concerning for COVID-19 infection (fever, chills, cough, or new shortness of breath).    Past Medical History:  Diagnosis Date  . ALLERGIC RHINITIS 03/17/2008  . Anemia, iron deficiency   . Blood  transfusion complicating pregnancy    after first child at 27 yo  . CAD (coronary artery disease)    Nonobstructive by cardiac catheter 8/12:EF 10-15%, circumflex calcified without obstructive CAD, proximal LAD 20%, mid LAD 20%, proximal RCA 20%, mid RCA 20%.  . Cardiomyopathy    a. remote h/o cath with reported nonobs CAD; b. echo 5/12: EF 15%, mild LVH, mild MR, LAE, mod pericardial effusion with increased filling pressures  . Cervical disc disease    with recurrent radicular pain, Dr. Montez Morita  . COPD (chronic obstructive pulmonary disease) (HCC) 06/14/2010  . DJD (degenerative joint disease)    hands  . Genital warts    hx  . GOITER, MULTINODULAR 12/23/2009  . Hyperlipidemia 11/23/2015  . Hypertension   . Hypothyroid   . Myocardial infarction (HCC)   . Systolic CHF [facility])    Past Surgical History:  Procedure Laterality Date  . CARDIAC CATHETERIZATION  1995   with "timy blockage" [facility] Dr. Daisy Floro  . CARDIAC DEFIBRILLATOR PLACEMENT  01-22-11   by Dr.Taylor  . CHOLECYSTECTOMY    . EP IMPLANTABLE DEVICE N/A 01/24/2016   Procedure: ICD Generator Changeout;  Surgeon: Marinus Maw, MD;  [facility] INVASIVE CV LAB;  Service: Cardiovascular;  Laterality: N/A;  . IMPLANTABLE CARDIOVERTER DEFIBRILLATOR IMPLANT N/A 01/22/2011   Procedure: IMPLANTABLE CARDIOVERTER DEFIBRILLATOR IMPLANT;  Surgeon: Marinus Maw, MD;  Location[facility] CATH LAB;  Service: Cardiovascular;  Laterality: N/A;     Current Meds  Medication  Sig  . albuterol (PROVENTIL) (5 MG/ML) 0.5% nebulizer solution Take 0.5 mLs (2.5 mg total) by nebulization every 6 (six) hours as needed for wheezing or shortness of breath.  Marland Kitchen aspirin 81 MG EC tablet Take 81 mg by mouth daily.  . carvedilol (COREG) 25 MG tablet Take 1 tablet (25 mg total) by mouth 2 (two) times daily with a meal.  . Cholecalciferol (VITAMIN D3) 1000 UNITS CAPS Take 1,000 Units by mouth daily.  . fexofenadine (ALLEGRA) 180 MG tablet Take 180 mg by mouth  daily.  . furosemide (LASIX) 40 MG tablet Take 1 tablet (40 mg total) by mouth daily.  . hydrocortisone cream 1 % Apply 1 application topically 3 (three) times daily as needed for itching.  . levothyroxine (SYNTHROID) 100 MCG tablet Take 2 tablets (200 mcg total) by mouth daily.  Marland Kitchen levothyroxine (SYNTHROID, LEVOTHROID) 25 MCG tablet Take 1 tablet (25 mcg total) by mouth daily before breakfast. In addition to the 200 mcg  . losartan (COZAAR) 50 MG tablet Take 1 tablet (50 mg total) by mouth daily.  . rosuvastatin (CRESTOR) 20 MG tablet Take 1 tablet (20 mg total) by mouth daily.  Marland Kitchen spironolactone (ALDACTONE) 50 MG tablet Take 1 tablet (50 mg total) by mouth daily.  Marland Kitchen tiotropium (SPIRIVA) 18 MCG inhalation capsule Place 1 capsule (18 mcg total) into inhaler and inhale every morning.  . triamcinolone (NASACORT) 55 MCG/ACT AERO nasal inhaler Place 1 spray into the nose daily.     Allergies:   Ivp dye [iodinated diagnostic agents], Latex, Penicillins, and Strawberry extract   Social History   Tobacco Use  . Smoking status: Former Smoker    Packs/day: 0.00  . Smokeless tobacco: Never Used  Vaping Use  . Vaping Use: Never used  Substance Use Topics  . Alcohol use: No    Alcohol/week: 0.0 standard drinks  . Drug use: No     Family Hx: The patient's family history includes Alcohol abuse in an other family member; Cancer in her sister; Depression in her sister; Diabetes in her father, mother, and sister; Heart disease in her father, mother, and sister; Hypertension in her father, mother, and sister; Malignant hyperthermia in an other family member; Stroke in her father and mother; Thyroid disease in her sister; Transient ischemic attack in her maternal grandfather.  ROS:   Please see the history of present illness.    All other systems reviewed and are negative.   Prior CV studies:   The following studies were reviewed today:  Echo 11/18/2018- IMPRESSIONS    1. The left ventricle has  normal systolic function, with an ejection  fraction of 55-60%. The cavity size was normal. There is moderately  increased left ventricular wall thickness. Left ventricular diastolic  Doppler parameters are consistent with  impaired relaxation.  2. The right ventricle has normal systolic function. The cavity was  normal. There is no increase in right ventricular wall thickness.  3. No evidence of mitral valve stenosis.  4. No stenosis of the aortic valve.  5. The aorta is normal unless otherwise noted.  6. The aortic root and ascending aorta are normal in size and structure.  7. The atrial septum is grossly normal.  8. The average left ventricular global longitudinal strain is -12.9   Labs/Other Tests and Data Reviewed:    EKG:  An ECG dated 06/26/2019 was personally reviewed today and demonstrated:  paced  Recent Labs: No results found for requested labs within last 8760 hours.  Recent Lipid Panel Lab Results  Component Value Date/Time   CHOL 179 04/10/2018 10:36 AM   TRIG 157.0 (H) 04/10/2018 10:36 AM   HDL 34.80 (L) 04/10/2018 10:36 AM   CHOLHDL 5 04/10/2018 10:36 AM   LDLCALC 113 (H) 04/10/2018 10:36 AM   LDLDIRECT 92.0 04/24/2017 03:59 PM    Wt Readings from Last 3 Encounters:  02/18/20 166 lb 9.6 oz (75.6 kg)  06/26/19 169 lb (76.7 kg)  05/07/19 169 lb 6.4 oz (76.8 kg)     Risk Assessment/Calculations:      Objective:    Vital Signs:  Ht 5\' 5"  (1.651 m)   Wt 166 lb 9.6 oz (75.6 kg)   BMI 27.72 kg/m    VITAL SIGNS:  reviewed  ASSESSMENT & PLAN:    NICM- Diagnosed in 2012 in the setting of severe hypothyroidism.  Her ejection fraction then was 15%.  She has a BiV ICD in place.  Her most recent ejection fraction was normal, 55 to 60% by echocardiogram September 2020.  CAD- Non obstructive CAD by cath 2012.  BiV ICD- Patient had a [employer] BiV ICD implanted 2012.  She had a generator change in 2017.  She is followed by Dr. 2018.  HTN- She was  unable to check her blood pressure at home.  Review of past blood pressure show that her blood pressures been under good control.  Plan-Same rx- f/u Dr Ladona Ridgel in 6 months.  She is due for lab work and tells me this has been ordered by Dr. Jens Som.   Shared Decision Making/Informed Consent        COVID-19 Education: The signs and symptoms of COVID-19 were discussed with the patient and how to seek care for testing (follow up with PCP or arrange E-visit).  The importance of social distancing was discussed today.  Time:   Today, I have spent 15 minutes with the patient with telehealth technology discussing the above problems.     Medication Adjustments/Labs and Tests Ordered: Current medicines are reviewed at length with the patient today.  Concerns regarding medicines are outlined above.   Tests Ordered: No orders of the defined types were placed in this encounter.   Medication Changes: No orders of the defined types were placed in this encounter.   Follow Up:  In Person Dr Jonny Ruiz in 6 months  Signed, Jens Som, PA-C  02/18/2020 10:04 AM    [facility]

## 2020-04-25 ENCOUNTER — Ambulatory Visit (INDEPENDENT_AMBULATORY_CARE_PROVIDER_SITE_OTHER): Payer: Medicare PPO

## 2020-04-25 DIAGNOSIS — I428 Other cardiomyopathies: Secondary | ICD-10-CM

## 2020-04-27 LAB — CUP PACEART REMOTE DEVICE CHECK
Battery Remaining Longevity: 49 mo
Battery Voltage: 2.99 V
Brady Statistic AP VP Percent: 29.93 %
Brady Statistic AP VS Percent: 0.01 %
Brady Statistic AS VP Percent: 70 %
Brady Statistic AS VS Percent: 0.06 %
Brady Statistic RA Percent Paced: 29.93 %
Brady Statistic RV Percent Paced: 99.92 %
Date Time Interrogation Session: 20220215193107
Implantable Lead Implant Date: 20121112
Implantable Lead Implant Date: 20121112
Implantable Lead Implant Date: 20121112
Implantable Lead Location: 753858
Implantable Lead Location: 753859
Implantable Lead Location: 753860
Implantable Lead Model: 4194
Implantable Lead Model: 5076
Implantable Lead Model: 6947
Implantable Pulse Generator Implant Date: 20171114
Lead Channel Impedance Value: 342 Ohm
Lead Channel Impedance Value: 361 Ohm
Lead Channel Impedance Value: 418 Ohm
Lead Channel Impedance Value: 437 Ohm
Lead Channel Impedance Value: 456 Ohm
Lead Channel Impedance Value: 532 Ohm
Lead Channel Impedance Value: 589 Ohm
Lead Channel Impedance Value: 684 Ohm
Lead Channel Impedance Value: 836 Ohm
Lead Channel Pacing Threshold Amplitude: 0.5 V
Lead Channel Pacing Threshold Amplitude: 0.625 V
Lead Channel Pacing Threshold Pulse Width: 0.4 ms
Lead Channel Pacing Threshold Pulse Width: 0.4 ms
Lead Channel Sensing Intrinsic Amplitude: 3.25 mV
Lead Channel Sensing Intrinsic Amplitude: 3.25 mV
Lead Channel Sensing Intrinsic Amplitude: 7.125 mV
Lead Channel Sensing Intrinsic Amplitude: 7.125 mV
Lead Channel Setting Pacing Amplitude: 1.5 V
Lead Channel Setting Pacing Amplitude: 1.5 V
Lead Channel Setting Pacing Amplitude: 2 V
Lead Channel Setting Pacing Pulse Width: 0.4 ms
Lead Channel Setting Pacing Pulse Width: 1 ms
Lead Channel Setting Sensing Sensitivity: 2.8 mV

## 2020-04-28 NOTE — Progress Notes (Signed)
Remote pacemaker transmission.   

## 2020-06-17 ENCOUNTER — Encounter: Payer: Medicare PPO | Admitting: Internal Medicine

## 2020-07-04 ENCOUNTER — Encounter: Payer: Medicare PPO | Admitting: Internal Medicine

## 2020-07-05 ENCOUNTER — Encounter: Payer: Medicare PPO | Admitting: Internal Medicine

## 2020-07-25 ENCOUNTER — Ambulatory Visit (INDEPENDENT_AMBULATORY_CARE_PROVIDER_SITE_OTHER): Payer: Medicare PPO

## 2020-07-25 DIAGNOSIS — I428 Other cardiomyopathies: Secondary | ICD-10-CM

## 2020-07-27 LAB — CUP PACEART REMOTE DEVICE CHECK
Battery Remaining Longevity: 44 mo
Battery Voltage: 2.98 V
Brady Statistic AP VP Percent: 25.57 %
Brady Statistic AP VS Percent: 0 %
Brady Statistic AS VP Percent: 74.36 %
Brady Statistic AS VS Percent: 0.07 %
Brady Statistic RA Percent Paced: 25.57 %
Brady Statistic RV Percent Paced: 99.91 %
Date Time Interrogation Session: 20220517154922
Implantable Lead Implant Date: 20121112
Implantable Lead Implant Date: 20121112
Implantable Lead Implant Date: 20121112
Implantable Lead Location: 753858
Implantable Lead Location: 753859
Implantable Lead Location: 753860
Implantable Lead Model: 4194
Implantable Lead Model: 5076
Implantable Lead Model: 6947
Implantable Pulse Generator Implant Date: 20171114
Lead Channel Impedance Value: 342 Ohm
Lead Channel Impedance Value: 380 Ohm
Lead Channel Impedance Value: 380 Ohm
Lead Channel Impedance Value: 399 Ohm
Lead Channel Impedance Value: 456 Ohm
Lead Channel Impedance Value: 475 Ohm
Lead Channel Impedance Value: 513 Ohm
Lead Channel Impedance Value: 608 Ohm
Lead Channel Impedance Value: 703 Ohm
Lead Channel Pacing Threshold Amplitude: 0.5 V
Lead Channel Pacing Threshold Amplitude: 0.75 V
Lead Channel Pacing Threshold Pulse Width: 0.4 ms
Lead Channel Pacing Threshold Pulse Width: 0.4 ms
Lead Channel Sensing Intrinsic Amplitude: 3.375 mV
Lead Channel Sensing Intrinsic Amplitude: 3.375 mV
Lead Channel Sensing Intrinsic Amplitude: 7.125 mV
Lead Channel Sensing Intrinsic Amplitude: 7.125 mV
Lead Channel Setting Pacing Amplitude: 1.5 V
Lead Channel Setting Pacing Amplitude: 1.5 V
Lead Channel Setting Pacing Amplitude: 2 V
Lead Channel Setting Pacing Pulse Width: 0.4 ms
Lead Channel Setting Pacing Pulse Width: 1 ms
Lead Channel Setting Sensing Sensitivity: 2.8 mV

## 2020-08-11 NOTE — Progress Notes (Deleted)
HPI: FU dilated cardiomyopathy. She was in the hospital in March 2012 with severe hypothyroidism and CHF. Followup echocardiogram in June 2012 demonstrated continued LV dysfunction with an EF of 15%. Cardiac catheterization was performed 10/25/10: EF 10-15%, circumflex calcified without obstructive CAD, proximal LAD 20%, mid LAD 20%, proximal RCA 20%, mid RCA 20%. She was felt to have mild CAD and a nonischemic cardiomyopathy. Also with h/o BIV ICD. Echocardiogram September 2020 showed ejection fraction 55 to 60%, grade 1 diastolic dysfunction. Since I last saw her,   Current Outpatient Medications  Medication Sig Dispense Refill  . albuterol (PROVENTIL) (5 MG/ML) 0.5% nebulizer solution Take 0.5 mLs (2.5 mg total) by nebulization every 6 (six) hours as needed for wheezing or shortness of breath. 20 mL 11  . aspirin 81 MG EC tablet Take 81 mg by mouth daily.    . carvedilol (COREG) 25 MG tablet Take 1 tablet (25 mg total) by mouth 2 (two) times daily with a meal. 180 tablet 3  . Cholecalciferol (VITAMIN D3) 1000 UNITS CAPS Take 1,000 Units by mouth daily.    . fexofenadine (ALLEGRA) 180 MG tablet Take 180 mg by mouth daily.    . furosemide (LASIX) 40 MG tablet Take 1 tablet (40 mg total) by mouth daily. 90 tablet 1  . hydrocortisone cream 1 % Apply 1 application topically 3 (three) times daily as needed for itching.    . levothyroxine (SYNTHROID) 100 MCG tablet Take 2 tablets (200 mcg total) by mouth daily. 180 tablet 3  . levothyroxine (SYNTHROID, LEVOTHROID) 25 MCG tablet Take 1 tablet (25 mcg total) by mouth daily before breakfast. In addition to the 200 mcg 90 tablet 3  . losartan (COZAAR) 50 MG tablet Take 1 tablet (50 mg total) by mouth daily. 90 tablet 3  . rosuvastatin (CRESTOR) 20 MG tablet Take 1 tablet (20 mg total) by mouth daily. 90 tablet 3  . spironolactone (ALDACTONE) 50 MG tablet Take 1 tablet (50 mg total) by mouth daily. 90 tablet 3  . tiotropium (SPIRIVA) 18 MCG  inhalation capsule Place 1 capsule (18 mcg total) into inhaler and inhale every morning. 90 capsule 3  . triamcinolone (NASACORT) 55 MCG/ACT AERO nasal inhaler Place 1 spray into the nose daily. 1 Inhaler 11   No current facility-administered medications for this visit.     Past Medical History:  Diagnosis Date  . ALLERGIC RHINITIS 03/17/2008  . Anemia, iron deficiency   . Blood transfusion complicating pregnancy    after first child at 35 yo  . CAD (coronary artery disease)    Nonobstructive by cardiac catheter 8/12:EF 10-15%, circumflex calcified without obstructive CAD, proximal LAD 20%, mid LAD 20%, proximal RCA 20%, mid RCA 20%.  . Cardiomyopathy    a. remote h/o cath with reported nonobs CAD; b. echo 5/12: EF 15%, mild LVH, mild MR, LAE, mod pericardial effusion with increased filling pressures  . Cervical disc disease    with recurrent radicular pain, Dr. Montez Morita  . COPD (chronic obstructive pulmonary disease) (HCC) 06/14/2010  . DJD (degenerative joint disease)    hands  . Genital warts    hx  . GOITER, MULTINODULAR 12/23/2009  . Hyperlipidemia 11/23/2015  . Hypertension   . Hypothyroid   . Myocardial infarction (HCC)   . Systolic CHF [facility])     Past Surgical History:  Procedure Laterality Date  . CARDIAC CATHETERIZATION  1995   with "timy blockage" [facility] Dr. Daisy Floro  . CARDIAC DEFIBRILLATOR PLACEMENT  01-22-11   by Dr.Taylor  . CHOLECYSTECTOMY    . EP IMPLANTABLE DEVICE N/A 01/24/2016   Procedure: ICD Generator Changeout;  Surgeon: Marinus Maw, MD;  Location: [facility] INVASIVE CV LAB;  Service: Cardiovascular;  Laterality: N/A;  . IMPLANTABLE CARDIOVERTER DEFIBRILLATOR IMPLANT N/A 01/22/2011   Procedure: IMPLANTABLE CARDIOVERTER DEFIBRILLATOR IMPLANT;  Surgeon: Marinus Maw, MD;  Location: [facility] CATH LAB;  Service: Cardiovascular;  Laterality: N/A;    Social History   Socioeconomic History  . Marital status: Divorced    Spouse name: Not on file  . Number of  children: 3  . Years of education: Not on file  . Highest education level: Not on file  Occupational History  . Occupation: Retired    Associate Professor: Advice worker [employer]  Tobacco Use  . Smoking status: Former Smoker    Packs/day: 0.00  . Smokeless tobacco: Never Used  Vaping Use  . Vaping Use: Never used  Substance and Sexual Activity  . Alcohol use: No    Alcohol/week: 0.0 standard drinks  . Drug use: No  . Sexual activity: Never  Other Topics Concern  . Not on file  Social History Narrative  . Not on file   Social Determinants of Health   Financial Resource Strain: Low Risk   . Difficulty of Paying Living Expenses: Not hard at all  Food Insecurity: No Food Insecurity  . Worried About Programme researcher, broadcasting/film/video in the Last Year: Never true  . Ran Out of Food in the Last Year: Never true  Transportation Needs: No Transportation Needs  . Lack of Transportation (Medical): No  . Lack of Transportation (Non-Medical): No  Physical Activity: Sufficiently Active  . Days of Exercise per Week: 5 days  . Minutes of Exercise per Session: 30 min  Stress: No Stress Concern Present  . Feeling of Stress : Not at all  Social Connections: Socially Integrated  . Frequency of Communication with Friends and Family: More than three times a week  . Frequency of Social Gatherings with Friends and Family: More than three times a week  . Attends Religious Services: More than 4 times per year  . Active Member of Clubs or Organizations: Yes  . Attends Banker Meetings: More than 4 times per year  . Marital Status: Married  Catering manager Violence: Not on file    Family History  Problem Relation Age of Onset  . Stroke Mother   . Heart disease Mother   . Hypertension Mother   . Diabetes Mother   . Stroke Father   . Heart disease Father   . Hypertension Father   . Diabetes Father   . Cancer Sister        colon  . Heart disease Sister        pacemaker  . Hypertension Sister   .  Depression Sister   . Diabetes Sister   . Thyroid disease Sister        uncertain type  . Transient ischemic attack Maternal Grandfather   . Alcohol abuse Other   . Malignant hyperthermia Other     ROS: no fevers or chills, productive cough, hemoptysis, dysphasia, odynophagia, melena, hematochezia, dysuria, hematuria, rash, seizure activity, orthopnea, PND, pedal edema, claudication. Remaining systems are negative.  Physical Exam: Well-developed well-nourished in no acute distress.  Skin is warm and dry.  HEENT is normal.  Neck is supple.  Chest is clear to auscultation with normal expansion.  Cardiovascular exam is regular rate and rhythm.  Abdominal exam nontender or distended. No masses palpated. Extremities show no edema. neuro grossly intact  ECG- personally reviewed  A/P  1 nonischemic cardiomyopathy-LV function has improved on most recent echocardiogram.  Continue ARB and beta-blocker.  2 coronary artery disease-patient denies chest pain.  Continue aspirin and statin.  3 chronic combined systolic/diastolic congestive heart failure-patient is euvolemic on examination.  Continue diuretics at present dose.  Check potassium and renal function.  4 hypertension-blood pressure controlled.  Continue present medical regimen.  5 hyperlipidemia-continue statin.  6 biventricular ICD-followed by Dr. Ladona Ridgel.  Olga Millers, MD

## 2020-08-17 NOTE — Progress Notes (Signed)
Remote pacemaker transmission.   

## 2020-08-23 ENCOUNTER — Ambulatory Visit: Payer: Medicare PPO | Admitting: Cardiology

## 2020-09-30 ENCOUNTER — Encounter: Payer: Medicare Other | Admitting: Internal Medicine

## 2020-10-16 NOTE — Progress Notes (Deleted)
Electrophysiology Office Note Date: 10/16/2020  ID:  Landry, Lookingbill 1950/08/27, MRN [id number]  PCP: Corwin Levins, MD Primary Cardiologist: None Electrophysiologist: Lewayne Bunting, MD   CC: Routine ICD follow-up  [name] is a 70 y.o. female seen today for Lewayne Bunting, MD for routine electrophysiology followup.  Since last being seen in our clinic the patient reports doing ***.  she denies chest pain, palpitations, dyspnea, PND, orthopnea, nausea, vomiting, dizziness, syncope, edema, weight gain, or early satiety. {He/she (caps):30048} has not had ICD shocks.   Device History: Medtronic BiV ICD implanted 2012, gen change 2017 for Chronic systolic CHF / CHB  Past Medical History:  Diagnosis Date   ALLERGIC RHINITIS 03/17/2008   Anemia, iron deficiency    Blood transfusion complicating pregnancy    after first child at 83 yo   CAD (coronary artery disease)    Nonobstructive by cardiac catheter 8/12:EF 10-15%, circumflex calcified without obstructive CAD, proximal LAD 20%, mid LAD 20%, proximal RCA 20%, mid RCA 20%.   Cardiomyopathy    a. remote h/o cath with reported nonobs CAD; b. echo 5/12: EF 15%, mild LVH, mild MR, LAE, mod pericardial effusion with increased filling pressures   Cervical disc disease    with recurrent radicular pain, Dr. Montez Morita   COPD (chronic obstructive pulmonary disease) (HCC) 06/14/2010   DJD (degenerative joint disease)    hands   Genital warts    hx   GOITER, MULTINODULAR 12/23/2009   Hyperlipidemia 11/23/2015   Hypertension    Hypothyroid    Myocardial infarction (HCC)    Systolic CHF [facility])    Past Surgical History:  Procedure Laterality Date   CARDIAC CATHETERIZATION  1995   with "timy blockage" [facility] Dr. Daisy Floro   CARDIAC DEFIBRILLATOR PLACEMENT  01-22-11   by Dr.Taylor   CHOLECYSTECTOMY     EP IMPLANTABLE DEVICE N/A 01/24/2016   Procedure: ICD Generator Changeout;  Surgeon: Marinus Maw, MD;  Location: [facility] INVASIVE CV  LAB;  Service: Cardiovascular;  Laterality: N/A;   IMPLANTABLE CARDIOVERTER DEFIBRILLATOR IMPLANT N/A 01/22/2011   Procedure: IMPLANTABLE CARDIOVERTER DEFIBRILLATOR IMPLANT;  Surgeon: Marinus Maw, MD;  Location: [facility] CATH LAB;  Service: Cardiovascular;  Laterality: N/A;    Current Outpatient Medications  Medication Sig Dispense Refill   albuterol (PROVENTIL) (5 MG/ML) 0.5% nebulizer solution Take 0.5 mLs (2.5 mg total) by nebulization every 6 (six) hours as needed for wheezing or shortness of breath. 20 mL 11   aspirin 81 MG EC tablet Take 81 mg by mouth daily.     carvedilol (COREG) 25 MG tablet Take 1 tablet (25 mg total) by mouth 2 (two) times daily with a meal. 180 tablet 3   Cholecalciferol (VITAMIN D3) 1000 UNITS CAPS Take 1,000 Units by mouth daily.     fexofenadine (ALLEGRA) 180 MG tablet Take 180 mg by mouth daily.     furosemide (LASIX) 40 MG tablet Take 1 tablet (40 mg total) by mouth daily. 90 tablet 1   hydrocortisone cream 1 % Apply 1 application topically 3 (three) times daily as needed for itching.     levothyroxine (SYNTHROID) 100 MCG tablet Take 2 tablets (200 mcg total) by mouth daily. 180 tablet 3   levothyroxine (SYNTHROID, LEVOTHROID) 25 MCG tablet Take 1 tablet (25 mcg total) by mouth daily before breakfast. In addition to the 200 mcg 90 tablet 3   losartan (COZAAR) 50 MG tablet Take 1 tablet (50 mg total) by mouth daily. 90 tablet  3   rosuvastatin (CRESTOR) 20 MG tablet Take 1 tablet (20 mg total) by mouth daily. 90 tablet 3   spironolactone (ALDACTONE) 50 MG tablet Take 1 tablet (50 mg total) by mouth daily. 90 tablet 3   tiotropium (SPIRIVA) 18 MCG inhalation capsule Place 1 capsule (18 mcg total) into inhaler and inhale every morning. 90 capsule 3   triamcinolone (NASACORT) 55 MCG/ACT AERO nasal inhaler Place 1 spray into the nose daily. 1 Inhaler 11   No current facility-administered medications for this visit.    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[employer]  Tobacco Use   Smoking status: Former    Packs/day: 0.00    Types: Cigarettes   Smokeless tobacco: Never  Vaping Use   Vaping Use: Never used  Substance and Sexual Activity   Alcohol use: No    Alcohol/week: 0.0 standard drinks   Drug use: No   Sexual activity: Never  Other Topics Concern   Not on file  Social History Narrative   Not on file   Social Determinants of Health   Financial Resource Strain: Low Risk    Difficulty of Paying Living Expenses: Not hard at all  Food Insecurity: No Food Insecurity   Worried About Programme researcher, broadcasting/film/video in the Last Year: Never true   Ran Out of Food in the Last Year: Never true  Transportation Needs: No Transportation Needs   Lack of Transportation (Medical): No   Lack of Transportation (Non-Medical): No  Physical Activity: Sufficiently Active   Days of Exercise per Week: 5 days   Minutes of Exercise per Session: 30 min  Stress: No Stress Concern Present   Feeling of Stress : Not at all  Social Connections: Socially Integrated   Frequency of Communication with Friends and Family: More than three times a week   Frequency of Social Gatherings with Friends and Family: More than three times a week   Attends Religious Services: More than 4 times per year   Active Member of [employer] or Organizations: Yes   Attends Engineer, structural: More than 4 times per year   Marital Status: Married  Catering manager Violence: Not on file    Family History: Family History  Problem Relation Age of Onset   Stroke Mother    Heart disease Mother    Hypertension Mother    Diabetes Mother    Stroke Father     Heart disease Father    Hypertension Father    Diabetes Father    Cancer Sister        colon   Heart disease Sister        pacemaker   Hypertension Sister    Depression Sister    Diabetes Sister    Thyroid disease Sister        uncertain type   Transient ischemic attack Maternal Grandfather    Alcohol abuse Other    Malignant hyperthermia Other     Review of Systems: All other systems reviewed and are otherwise negative except as noted above.   Physical Exam: There were no vitals filed for this visit.  GEN- The patient is well appearing, alert and oriented x 3 today.   HEENT: normocephalic, atraumatic; sclera clear, conjunctiva pink; hearing intact; oropharynx clear; neck supple, no JVP Lymph- no cervical lymphadenopathy Lungs- Clear to ausculation bilaterally, normal work of breathing.  No wheezes, rales, rhonchi Heart- Regular rate and rhythm, no murmurs, rubs or gallops, PMI not laterally displaced GI- soft, non-tender, non-distended, bowel sounds present, no hepatosplenomegaly Extremities- no clubbing or cyanosis. No edema; DP/PT/radial pulses 2+ bilaterally MS- no significant deformity or atrophy Skin- warm and dry, no rash or lesion; ICD pocket well healed Psych- euthymic mood, full affect Neuro- strength and sensation are intact  ICD interrogation- reviewed in detail today,  See PACEART report  EKG:  EKG {ACTION; IS/IS LEX:51700174} ordered today. Personal review of EKG ordered {Blank single:19197::"today","***"} shows  Recent Labs: No results found for requested labs within last 8760 hours.   Wt Readings from Last 3 Encounters:  02/18/20 166 lb 9.6 oz (75.6 kg)  06/26/19 169 lb (76.7 kg)  05/07/19 169 lb 6.4 oz (76.8 kg)     Other studies Reviewed: Additional studies/ records that were reviewed today include: ***   Assessment and Plan:  1.  Chronic systolic dysfunction s/p Medtronic CRT-D  euvolemic today Stable on an appropriate medical  regimen Normal ICD function See Pace Art report No changes today  2. CHB  3. HTN Well controlled  4. HLD Continue statin  Current medicines are reviewed at length with the patient today.   The patient {ACTIONS; HAS/DOES NOT HAVE:19233} concerns regarding her medicines.  The following changes were made today:  {NONE DEFAULTED:18576}  Labs/ tests ordered today include: *** No orders of the defined types were placed in this encounter.    Disposition:   Follow up with {Blank single:19197::"Dr. Allred","Dr. Amada Jupiter. Klein","Dr. Camnitz","Dr. Lambert","EP APP"}  {gen number [id number]} {TIME; UNITS DAY/WEEK/MONTH:19136}   Signed, Graciella Freer, PA-C  10/16/2020 10:11 PM  [facility] [address] [place] [address] [phone] (office) [phone] (fax)

## 2020-10-18 ENCOUNTER — Encounter: Payer: Medicare Other | Admitting: Student

## 2020-10-18 DIAGNOSIS — I428 Other cardiomyopathies: Secondary | ICD-10-CM

## 2020-10-18 DIAGNOSIS — I5022 Chronic systolic (congestive) heart failure: Secondary | ICD-10-CM

## 2020-10-18 DIAGNOSIS — I10 Essential (primary) hypertension: Secondary | ICD-10-CM

## 2020-10-18 DIAGNOSIS — I2510 Atherosclerotic heart disease of native coronary artery without angina pectoris: Secondary | ICD-10-CM

## 2020-10-24 ENCOUNTER — Ambulatory Visit (INDEPENDENT_AMBULATORY_CARE_PROVIDER_SITE_OTHER): Payer: Medicare PPO

## 2020-10-24 DIAGNOSIS — I428 Other cardiomyopathies: Secondary | ICD-10-CM

## 2020-10-26 LAB — CUP PACEART REMOTE DEVICE CHECK
Battery Remaining Longevity: 47 mo
Battery Voltage: 2.98 V
Brady Statistic AP VP Percent: 19.31 %
Brady Statistic AP VS Percent: 0 %
Brady Statistic AS VP Percent: 80.44 %
Brady Statistic AS VS Percent: 0.24 %
Brady Statistic RA Percent Paced: 19.3 %
Brady Statistic RV Percent Paced: 99.7 %
Date Time Interrogation Session: 20220815214804
Implantable Lead Implant Date: 20121112
Implantable Lead Implant Date: 20121112
Implantable Lead Implant Date: 20121112
Implantable Lead Location: 753858
Implantable Lead Location: 753859
Implantable Lead Location: 753860
Implantable Lead Model: 4194
Implantable Lead Model: 5076
Implantable Lead Model: 6947
Implantable Pulse Generator Implant Date: 20171114
Lead Channel Impedance Value: 342 Ohm
Lead Channel Impedance Value: 380 Ohm
Lead Channel Impedance Value: 418 Ohm
Lead Channel Impedance Value: 437 Ohm
Lead Channel Impedance Value: 475 Ohm
Lead Channel Impedance Value: 513 Ohm
Lead Channel Impedance Value: 589 Ohm
Lead Channel Impedance Value: 684 Ohm
Lead Channel Impedance Value: 836 Ohm
Lead Channel Pacing Threshold Amplitude: 0.5 V
Lead Channel Pacing Threshold Amplitude: 0.625 V
Lead Channel Pacing Threshold Pulse Width: 0.4 ms
Lead Channel Pacing Threshold Pulse Width: 0.4 ms
Lead Channel Sensing Intrinsic Amplitude: 3 mV
Lead Channel Sensing Intrinsic Amplitude: 3 mV
Lead Channel Sensing Intrinsic Amplitude: 7.125 mV
Lead Channel Sensing Intrinsic Amplitude: 7.125 mV
Lead Channel Setting Pacing Amplitude: 1.5 V
Lead Channel Setting Pacing Amplitude: 1.5 V
Lead Channel Setting Pacing Amplitude: 2 V
Lead Channel Setting Pacing Pulse Width: 0.4 ms
Lead Channel Setting Pacing Pulse Width: 1 ms
Lead Channel Setting Sensing Sensitivity: 2.8 mV

## 2020-11-11 NOTE — Progress Notes (Signed)
Remote pacemaker transmission.   

## 2020-12-08 ENCOUNTER — Ambulatory Visit (INDEPENDENT_AMBULATORY_CARE_PROVIDER_SITE_OTHER): Payer: Medicare Other | Admitting: Internal Medicine

## 2020-12-08 ENCOUNTER — Other Ambulatory Visit: Payer: Self-pay

## 2020-12-08 ENCOUNTER — Encounter: Payer: Self-pay | Admitting: Internal Medicine

## 2020-12-08 ENCOUNTER — Ambulatory Visit (INDEPENDENT_AMBULATORY_CARE_PROVIDER_SITE_OTHER): Payer: Medicare Other

## 2020-12-08 VITALS — BP 148/76 | HR 74 | Temp 99.1°F | Ht 65.0 in | Wt 173.6 lb

## 2020-12-08 DIAGNOSIS — I10 Essential (primary) hypertension: Secondary | ICD-10-CM

## 2020-12-08 DIAGNOSIS — J441 Chronic obstructive pulmonary disease with (acute) exacerbation: Secondary | ICD-10-CM | POA: Diagnosis not present

## 2020-12-08 DIAGNOSIS — R062 Wheezing: Secondary | ICD-10-CM | POA: Diagnosis not present

## 2020-12-08 DIAGNOSIS — Z23 Encounter for immunization: Secondary | ICD-10-CM | POA: Diagnosis not present

## 2020-12-08 DIAGNOSIS — I428 Other cardiomyopathies: Secondary | ICD-10-CM

## 2020-12-08 DIAGNOSIS — I429 Cardiomyopathy, unspecified: Secondary | ICD-10-CM | POA: Diagnosis not present

## 2020-12-08 DIAGNOSIS — E7800 Pure hypercholesterolemia, unspecified: Secondary | ICD-10-CM | POA: Diagnosis not present

## 2020-12-08 DIAGNOSIS — E538 Deficiency of other specified B group vitamins: Secondary | ICD-10-CM | POA: Diagnosis not present

## 2020-12-08 DIAGNOSIS — R059 Cough, unspecified: Secondary | ICD-10-CM | POA: Diagnosis not present

## 2020-12-08 DIAGNOSIS — E559 Vitamin D deficiency, unspecified: Secondary | ICD-10-CM | POA: Diagnosis not present

## 2020-12-08 DIAGNOSIS — Z0001 Encounter for general adult medical examination with abnormal findings: Secondary | ICD-10-CM

## 2020-12-08 DIAGNOSIS — R0600 Dyspnea, unspecified: Secondary | ICD-10-CM | POA: Diagnosis not present

## 2020-12-08 DIAGNOSIS — R739 Hyperglycemia, unspecified: Secondary | ICD-10-CM | POA: Diagnosis not present

## 2020-12-08 LAB — HEPATIC FUNCTION PANEL
ALT: 8 U/L (ref 0–35)
AST: 15 U/L (ref 0–37)
Albumin: 4.1 g/dL (ref 3.5–5.2)
Alkaline Phosphatase: 91 U/L (ref 39–117)
Bilirubin, Direct: 0.1 mg/dL (ref 0.0–0.3)
Total Bilirubin: 0.5 mg/dL (ref 0.2–1.2)
Total Protein: 8.7 g/dL — ABNORMAL HIGH (ref 6.0–8.3)

## 2020-12-08 LAB — CBC WITH DIFFERENTIAL/PLATELET
Basophils Absolute: 0 10*3/uL (ref 0.0–0.1)
Basophils Relative: 0.7 % (ref 0.0–3.0)
Eosinophils Absolute: 0.1 10*3/uL (ref 0.0–0.7)
Eosinophils Relative: 1 % (ref 0.0–5.0)
HCT: 36.2 % (ref 36.0–46.0)
Hemoglobin: 11.6 g/dL — ABNORMAL LOW (ref 12.0–15.0)
Lymphocytes Relative: 31.1 % (ref 12.0–46.0)
Lymphs Abs: 1.9 10*3/uL (ref 0.7–4.0)
MCHC: 32 g/dL (ref 30.0–36.0)
MCV: 83.6 fl (ref 78.0–100.0)
Monocytes Absolute: 0.5 10*3/uL (ref 0.1–1.0)
Monocytes Relative: 7.5 % (ref 3.0–12.0)
Neutro Abs: 3.6 10*3/uL (ref 1.4–7.7)
Neutrophils Relative %: 59.7 % (ref 43.0–77.0)
Platelets: 323 10*3/uL (ref 150.0–400.0)
RBC: 4.33 Mil/uL (ref 3.87–5.11)
RDW: 15.6 % — ABNORMAL HIGH (ref 11.5–15.5)
WBC: 6.1 10*3/uL (ref 4.0–10.5)

## 2020-12-08 LAB — BASIC METABOLIC PANEL
BUN: 13 mg/dL (ref 6–23)
CO2: 28 mEq/L (ref 19–32)
Calcium: 10 mg/dL (ref 8.4–10.5)
Chloride: 102 mEq/L (ref 96–112)
Creatinine, Ser: 1.49 mg/dL — ABNORMAL HIGH (ref 0.40–1.20)
GFR: 34.97 mL/min — ABNORMAL LOW (ref >60.00)
Glucose, Bld: 93 mg/dL (ref 70–99)
Potassium: 4.1 mEq/L (ref 3.5–5.1)
Sodium: 138 mEq/L (ref 135–145)

## 2020-12-08 LAB — HEMOGLOBIN A1C: Hgb A1c MFr Bld: 6.7 % — ABNORMAL HIGH (ref 4.6–6.5)

## 2020-12-08 LAB — LIPID PANEL
Cholesterol: 151 mg/dL (ref 0–200)
HDL: 41.1 mg/dL (ref >39.00)
LDL Cholesterol: 94 mg/dL (ref 0–99)
NonHDL: 110.13
Total CHOL/HDL Ratio: 4
Triglycerides: 83 mg/dL (ref 0.0–149.0)
VLDL: 16.6 mg/dL (ref 0.0–40.0)

## 2020-12-08 LAB — VITAMIN B12: Vitamin B-12: 243 pg/mL (ref 211–911)

## 2020-12-08 LAB — TSH: TSH: 45.39 u[IU]/mL — ABNORMAL HIGH (ref 0.35–5.50)

## 2020-12-08 LAB — VITAMIN D 25 HYDROXY (VIT D DEFICIENCY, FRACTURES): VITD: 14.73 ng/mL — ABNORMAL LOW (ref 30.00–100.00)

## 2020-12-08 MED ORDER — CARVEDILOL 25 MG PO TABS: 25.0000 mg | ORAL_TABLET | Freq: Two times a day (BID) | ORAL | 3 refills | Status: AC

## 2020-12-08 MED ORDER — LEVOFLOXACIN 500 MG PO TABS: 500.0000 mg | ORAL_TABLET | Freq: Every day | ORAL | 0 refills | Status: AC

## 2020-12-08 MED ORDER — ALBUTEROL SULFATE (5 MG/ML) 0.5% IN NEBU: 2.5000 mg | INHALATION_SOLUTION | Freq: Four times a day (QID) | RESPIRATORY_TRACT | 11 refills | Status: DC | PRN

## 2020-12-08 MED ORDER — ALBUTEROL SULFATE HFA 108 (90 BASE) MCG/ACT IN AERS: 2.0000 | INHALATION_SPRAY | Freq: Four times a day (QID) | RESPIRATORY_TRACT | 11 refills | Status: DC | PRN

## 2020-12-08 MED ORDER — HYDROCODONE BIT-HOMATROP MBR 5-1.5 MG/5ML PO SOLN: 5.0000 mL | Freq: Four times a day (QID) | ORAL | 0 refills | Status: AC | PRN

## 2020-12-08 MED ORDER — ROSUVASTATIN CALCIUM 20 MG PO TABS: 20.0000 mg | ORAL_TABLET | Freq: Every day | ORAL | 3 refills | Status: DC

## 2020-12-08 MED ORDER — LEVOTHYROXINE SODIUM 25 MCG PO TABS: 25.0000 ug | ORAL_TABLET | Freq: Every day | ORAL | 3 refills | Status: DC

## 2020-12-08 MED ORDER — SPIRONOLACTONE 50 MG PO TABS: 50.0000 mg | ORAL_TABLET | Freq: Every day | ORAL | 3 refills | Status: DC

## 2020-12-08 MED ORDER — LEVOTHYROXINE SODIUM 100 MCG PO TABS: 200.0000 ug | ORAL_TABLET | Freq: Every day | ORAL | 3 refills | Status: DC

## 2020-12-08 MED ORDER — TIOTROPIUM BROMIDE MONOHYDRATE 18 MCG IN CAPS: 18.0000 ug | ORAL_CAPSULE | RESPIRATORY_TRACT | 3 refills | Status: DC

## 2020-12-08 MED ORDER — PREDNISONE 10 MG PO TABS: ORAL_TABLET | ORAL | 0 refills | Status: DC

## 2020-12-08 MED ORDER — METHYLPREDNISOLONE ACETATE 80 MG/ML IJ SUSP
80.0000 mg | Freq: Once | INTRAMUSCULAR | Status: AC
  Administered 2020-12-08: 80 mg via INTRAMUSCULAR

## 2020-12-08 MED ORDER — LOSARTAN POTASSIUM 50 MG PO TABS: 50.0000 mg | ORAL_TABLET | Freq: Every day | ORAL | 3 refills | Status: DC

## 2020-12-08 MED ORDER — FUROSEMIDE 40 MG PO TABS: 40.0000 mg | ORAL_TABLET | Freq: Every day | ORAL | 3 refills | Status: DC

## 2020-12-08 NOTE — Assessment & Plan Note (Signed)
Mild to mod uncontrolled, for depomedrol im 80, and for home - levaquin course, predpac, albuterol prn, and cough med prn, also for cxr

## 2020-12-08 NOTE — Assessment & Plan Note (Signed)
Lab Results  Component Value Date   HGBA1C 6.7 (H) 12/08/2020   Stable, pt to continue current medical treatment  - diet

## 2020-12-08 NOTE — Assessment & Plan Note (Signed)
Age and sex appropriate education and counseling updated with regular exercise and diet Referrals for preventative services - none needed Immunizations addressed - declines covid booster, shingrx, tdap Smoking counseling  - none needed Evidence for depression or other mood disorder - none significant Most recent labs reviewed. I have personally reviewed and have noted: 1) the patient's medical and social history 2) The patient's current medications and supplements 3) The patient's height, weight, and BMI have been recorded in the chart

## 2020-12-08 NOTE — Patient Instructions (Signed)
You had the flu shot today, and the steroid shot today  Please take all new medication as prescribed - the antibiotic pills, cough medicine, prednisone, and inhaler  Please continue all other medications as before, and refills have been done if requested.  Please have the pharmacy call with any other refills you may need.  Please continue your efforts at being more active, low cholesterol diet, and weight control.  You are otherwise up to date with prevention measures today.  Please keep your appointments with your specialists as you may have planned  Please go to the XRAY Department in the first floor for the x-ray testing  Please go to the LAB at the blood drawing area for the tests to be done  You will be contacted by phone if any changes need to be made immediately.  Otherwise, you will receive a letter about your results with an explanation, but please check with MyChart first.  Please remember to sign up for MyChart if you have not done so, as this will be important to you in the future with finding out test results, communicating by private email, and scheduling acute appointments online when needed.  Please make an Appointment to return in 6 months, or sooner if needed

## 2020-12-08 NOTE — Assessment & Plan Note (Signed)
Last vitamin D Lab Results  Component Value Date   VD25OH 14.73 (L) 12/08/2020   Low, to start oral replacement

## 2020-12-08 NOTE — Assessment & Plan Note (Signed)
BP Readings from Last 3 Encounters:  12/08/20 (!) 148/76  06/26/19 128/78  11/10/18 128/74   Uncontrolled, pt states ok at home, pt to continue medical treatment coreg, losartan, aldactone

## 2020-12-08 NOTE — Progress Notes (Signed)
Patient ID: [name], female   DOB: [birth year], 70 y.o.   MRN: [id number]         Chief Complaint:: wellness exam and sob, elevated BP       HPI:  [name] is a 70 y.o. female here for wellness exam; declines covid booster, shingrix, tdap, o/w up to date with preventive referrals and immunizations                        Also c/o 2-3 days onset feverish, sob, scant prod cough, wheezing with doe   Wt Readings from Last 3 Encounters:  12/08/20 173 lb 9.6 oz (78.7 kg)  02/18/20 166 lb 9.6 oz (75.6 kg)  06/26/19 169 lb (76.7 kg)   BP Readings from Last 3 Encounters:  12/08/20 (!) 148/76  06/26/19 128/78  11/10/18 128/74   Immunization History  Administered Date(s) Administered   Fluad Quad(high Dose 65+) 01/29/2019, 12/08/2020   Influenza Split 12/12/2011   Influenza, High Dose Seasonal PF 12/04/2016, 12/10/2017   Influenza,inj,Quad PF,6+ Mos 11/23/2015   Moderna Sars-Covid-2 Vaccination 05/30/2019, 06/30/2019, 01/17/2020   Pneumococcal Conjugate-13 06/02/2014   Pneumococcal Polysaccharide-23 11/23/2015   Td 11/15/2009   There are no preventive care reminders to display for this patient.     Past Medical History:  Diagnosis Date   ALLERGIC RHINITIS 03/17/2008   Anemia, iron deficiency    Blood transfusion complicating pregnancy    after first child at 16 yo   CAD (coronary artery disease)    Nonobstructive by cardiac catheter 8/12:EF 10-15%, circumflex calcified without obstructive CAD, proximal LAD 20%, mid LAD 20%, proximal RCA 20%, mid RCA 20%.   Cardiomyopathy    a. remote h/o cath with reported nonobs CAD; b. echo 5/12: EF 15%, mild LVH, mild MR, LAE, mod pericardial effusion with increased filling pressures   Cervical disc disease    with recurrent radicular pain, Dr. Montez Morita   COPD (chronic obstructive pulmonary disease) (HCC) 06/14/2010   DJD (degenerative joint disease)    hands   Genital warts    hx   GOITER, MULTINODULAR 12/23/2009   Hyperlipidemia  11/23/2015   Hypertension    Hypothyroid    Myocardial infarction (HCC)    Systolic CHF [facility])    Past Surgical History:  Procedure Laterality Date   CARDIAC CATHETERIZATION  1995   with "timy blockage" [facility] Dr. Daisy Floro   CARDIAC DEFIBRILLATOR PLACEMENT  01-22-11   by Dr.Taylor   CHOLECYSTECTOMY     EP IMPLANTABLE DEVICE N/A 01/24/2016   Procedure: ICD Generator Changeout;  Surgeon: Marinus Maw, MD;  Location: [facility] INVASIVE CV LAB;  Service: Cardiovascular;  Laterality: N/A;   IMPLANTABLE CARDIOVERTER DEFIBRILLATOR IMPLANT N/A 01/22/2011   Procedure: IMPLANTABLE CARDIOVERTER DEFIBRILLATOR IMPLANT;  Surgeon: Marinus Maw, MD;  Location: [facility] CATH LAB;  Service: Cardiovascular;  Laterality: N/A;    reports that she has quit smoking. Her smoking use included cigarettes. She has never used smokeless tobacco. She reports that she does not drink alcohol and does not use drugs. family history includes Alcohol abuse in an other family member; Cancer in her sister; Depression in her sister; Diabetes in her father, mother, and sister; Heart disease in her father, mother, and sister; Hypertension in her father, mother, and sister; Malignant hyperthermia in an other family member; Stroke in her father and mother; Thyroid disease in her sister; Transient ischemic attack in her maternal grandfather. Allergies  Allergen Reactions   Ivp Dye [  Iodinated Diagnostic Agents] Itching and Rash    rash, itching & peel (patient has a MILDER reaction when given w/benadryl)   Latex Rash    Allergic to latex gloves   Penicillins Itching, Swelling, Rash and Other (See Comments)    Has patient had a PCN reaction causing immediate rash, facial/tongue/throat swelling, SOB or lightheadedness with hypotension:Yes Has patient had a PCN reaction causing severe rash involving mucus membranes or skin necrosis:Yes Has patient had a PCN reaction that required hospitalization:Pt. Was inpatient when reaction  occurred Has patient had a PCN reaction occurring within the last 10 years: Yes If all of the above answers are "NO", then may proceed with Cephalosporin use.    Strawberry Extract Swelling and Rash        Current Outpatient Medications on File Prior to Visit  Medication Sig Dispense Refill   aspirin 81 MG EC tablet Take 81 mg by mouth daily.     Cholecalciferol (VITAMIN D3) 1000 UNITS CAPS Take 1,000 Units by mouth daily.     fexofenadine (ALLEGRA) 180 MG tablet Take 180 mg by mouth daily.     hydrocortisone cream 1 % Apply 1 application topically 3 (three) times daily as needed for itching.     triamcinolone (NASACORT) 55 MCG/ACT AERO nasal inhaler Place 1 spray into the nose daily. 1 Inhaler 11   No current facility-administered medications on file prior to visit.        ROS:  All others reviewed and negative.  Objective        PE:  BP (!) 148/76 (BP Location: Right Arm, Patient Position: Sitting, Cuff Size: Large)   Pulse 74   Temp 99.1 F (37.3 C) (Oral)   Ht 5\' 5"  (1.651 m)   Wt 173 lb 9.6 oz (78.7 kg)   SpO2 97%   BMI 28.89 kg/m                 Constitutional: Pt appears in NAD               HENT: Head: NCAT.                Right Ear: External ear normal.                 Left Ear: External ear normal.                Eyes: . Pupils are equal, round, and reactive to light. Conjunctivae and EOM are normal               Nose: without d/c or deformity               Neck: Neck supple. Gross normal ROM               Cardiovascular: Normal rate and regular rhythm.                 Pulmonary/Chest: Effort normal and breath sounds decreased without rales but with mild bilat wheezing.                Abd:  Soft, NT, ND, + BS, no organomegaly               Neurological: Pt is alert. At baseline orientation, motor grossly intact               Skin: Skin is warm. No rashes, no other new lesions, LE edema - none  Psychiatric: Pt behavior is normal without agitation    Micro: none  Cardiac tracings I have personally interpreted today:  none  Pertinent Radiological findings (summarize): none   Lab Results  Component Value Date   WBC 6.1 12/08/2020   HGB 11.6 (L) 12/08/2020   HCT 36.2 12/08/2020   PLT 323.0 12/08/2020   GLUCOSE 93 12/08/2020   CHOL 151 12/08/2020   TRIG 83.0 12/08/2020   HDL 41.10 12/08/2020   LDLDIRECT 92.0 04/24/2017   LDLCALC 94 12/08/2020   ALT 8 12/08/2020   AST 15 12/08/2020   NA 138 12/08/2020   K 4.1 12/08/2020   CL 102 12/08/2020   CREATININE 1.49 (H) 12/08/2020   BUN 13 12/08/2020   CO2 28 12/08/2020   TSH 45.39 (H) 12/08/2020   INR 1.0 01/16/2011   HGBA1C 6.7 (H) 12/08/2020   Assessment/Plan:  [name] is a 70 y.o. Black or African American [2] female with  has a past medical history of ALLERGIC RHINITIS (03/17/2008), Anemia, iron deficiency, Blood transfusion complicating pregnancy, CAD (coronary artery disease), Cardiomyopathy, Cervical disc disease, COPD (chronic obstructive pulmonary disease) (HCC) (06/14/2010), DJD (degenerative joint disease), Genital warts, GOITER, MULTINODULAR (12/23/2009), Hyperlipidemia (11/23/2015), Hypertension, Hypothyroid, Myocardial infarction (HCC), and Systolic CHF (HCC).  Encounter for well adult exam with abnormal findings Age and sex appropriate education and counseling updated with regular exercise and diet Referrals for preventative services - none needed Immunizations addressed - declines covid booster, shingrx, tdap Smoking counseling  - none needed Evidence for depression or other mood disorder - none significant Most recent labs reviewed. I have personally reviewed and have noted: 1) the patient's medical and social history 2) The patient's current medications and supplements 3) The patient's height, weight, and BMI have been recorded in the chart   Hyperlipidemia  pt to continue current statin  - crestor 20, and f/u lab, goal ldl < 70   Hyperglycemia Lab  Results  Component Value Date   HGBA1C 6.7 (H) 12/08/2020   Stable, pt to continue current medical treatment  - diet   COPD exacerbation (HCC) Mild to mod uncontrolled, for depomedrol im 80, and for home - levaquin course, predpac, albuterol prn, and cough med prn, also for cxr  Vitamin D deficiency Last vitamin D Lab Results  Component Value Date   VD25OH 14.73 (L) 12/08/2020   Low, to start oral replacement   Essential hypertension BP Readings from Last 3 Encounters:  12/08/20 (!) 148/76  06/26/19 128/78  11/10/18 128/74   Uncontrolled, pt states ok at home, pt to continue medical treatment coreg, losartan, aldactone  Followup: Return in about 6 months (around 06/07/2021).  Oliver Barre, MD 12/08/2020 8:33 PM [facility]

## 2020-12-08 NOTE — Assessment & Plan Note (Signed)
pt to continue current statin  - crestor 20, and f/u lab, goal ldl < 70

## 2020-12-10 ENCOUNTER — Encounter: Payer: Self-pay | Admitting: Internal Medicine

## 2020-12-15 NOTE — Progress Notes (Signed)
Needs OV.  

## 2020-12-16 ENCOUNTER — Telehealth: Payer: Self-pay

## 2020-12-16 NOTE — Telephone Encounter (Signed)
Pneumonia is getting better and wanting to let Dr. Jonny Ruiz know.

## 2021-02-17 ENCOUNTER — Telehealth: Payer: Self-pay | Admitting: Internal Medicine

## 2021-02-17 NOTE — Telephone Encounter (Signed)
Left message for patient to call back to schedule Medicare Annual Wellness Visit   Last AWV  03/14/19  Please schedule at anytime with [facility] Advisor if patient calls the office back.    40 Minutes appointment   Any questions, please call me at [phone]

## 2021-02-23 ENCOUNTER — Encounter: Payer: Self-pay | Admitting: Cardiology

## 2021-02-27 ENCOUNTER — Encounter: Payer: Medicare Other | Admitting: Internal Medicine

## 2021-03-23 NOTE — Progress Notes (Deleted)
HPI:FU dilated cardiomyopathy. She was in the hospital in March 2012 with severe hypothyroidism and CHF. Followup echocardiogram in June 2012 demonstrated continued LV dysfunction with an EF of 15%. Cardiac catheterization was performed 10/25/10: EF 10-15%, circumflex calcified without obstructive CAD, proximal LAD 20%, mid LAD 20%, proximal RCA 20%, mid RCA 20%. She was felt to have mild CAD and a nonischemic cardiomyopathy. Also with h/o BIV ICD. Echocardiogram September 2020 showed ejection fraction 55 to 123456, grade 1 diastolic dysfunction. Since I last saw her,   Current Outpatient Medications  Medication Sig Dispense Refill   albuterol (PROVENTIL) (5 MG/ML) 0.5% nebulizer solution Take 0.5 mLs (2.5 mg total) by nebulization every 6 (six) hours as needed for wheezing or shortness of breath. 20 mL 11   albuterol (VENTOLIN HFA) 108 (90 Base) MCG/ACT inhaler Inhale 2 puffs into the lungs every 6 (six) hours as needed for wheezing or shortness of breath. 8 g 11   aspirin 81 MG EC tablet Take 81 mg by mouth daily.     carvedilol (COREG) 25 MG tablet Take 1 tablet (25 mg total) by mouth 2 (two) times daily with a meal. 180 tablet 3   Cholecalciferol (VITAMIN D3) 1000 UNITS CAPS Take 1,000 Units by mouth daily.     fexofenadine (ALLEGRA) 180 MG tablet Take 180 mg by mouth daily.     furosemide (LASIX) 40 MG tablet Take 1 tablet (40 mg total) by mouth daily. 90 tablet 3   hydrocortisone cream 1 % Apply 1 application topically 3 (three) times daily as needed for itching.     levothyroxine (SYNTHROID) 100 MCG tablet Take 2 tablets (200 mcg total) by mouth daily. 180 tablet 3   levothyroxine (SYNTHROID) 25 MCG tablet Take 1 tablet (25 mcg total) by mouth daily before breakfast. In addition to the 200 mcg 90 tablet 3   losartan (COZAAR) 50 MG tablet Take 1 tablet (50 mg total) by mouth daily. 90 tablet 3   predniSONE (DELTASONE) 10 MG tablet 3 tabs by mouth per day for 3 days,2tabs per day for 3  days, 1tab per day for 3 days 18 tablet 0   rosuvastatin (CRESTOR) 20 MG tablet Take 1 tablet (20 mg total) by mouth daily. 90 tablet 3   spironolactone (ALDACTONE) 50 MG tablet Take 1 tablet (50 mg total) by mouth daily. 90 tablet 3   tiotropium (SPIRIVA) 18 MCG inhalation capsule Place 1 capsule (18 mcg total) into inhaler and inhale every morning. 90 capsule 3   triamcinolone (NASACORT) 55 MCG/ACT AERO nasal inhaler Place 1 spray into the nose daily. 1 Inhaler 11   No current facility-administered medications for this visit.     Past Medical History:  Diagnosis Date   ALLERGIC RHINITIS 03/17/2008   Anemia, iron deficiency    Blood transfusion complicating pregnancy    after first child at 73 yo   CAD (coronary artery disease)    Nonobstructive by cardiac catheter 8/12:EF 10-15%, circumflex calcified without obstructive CAD, proximal LAD 20%, mid LAD 20%, proximal RCA 20%, mid RCA 20%.   Cardiomyopathy    a. remote h/o cath with reported nonobs CAD; b. echo 5/12: EF 15%, mild LVH, mild MR, LAE, mod pericardial effusion with increased filling pressures   Cervical disc disease    with recurrent radicular pain, Dr. Eulas Post   COPD (chronic obstructive pulmonary disease) ([place]) 06/14/2010   DJD (degenerative joint disease)    hands   Genital warts    hx  GOITER, MULTINODULAR 12/23/2009   Hyperlipidemia 11/23/2015   Hypertension    Hypothyroid    Myocardial infarction ([place])    Systolic CHF [facility])     Past Surgical History:  Procedure Laterality Date   [place]   with "timy blockage" [facility] Dr. Sherald Barge   CARDIAC DEFIBRILLATOR PLACEMENT  01-22-11   by Dr.Taylor   CHOLECYSTECTOMY     EP IMPLANTABLE DEVICE N/A 01/24/2016   Procedure: [place];  Surgeon: Evans Lance, MD;  Location: [facility] CV LAB;  Service: Cardiovascular;  Laterality: N/A;   IMPLANTABLE CARDIOVERTER DEFIBRILLATOR IMPLANT N/A 01/22/2011   Procedure: IMPLANTABLE  CARDIOVERTER DEFIBRILLATOR IMPLANT;  Surgeon: Evans Lance, MD;  Location: [facility] CATH LAB;  Service: Cardiovascular;  Laterality: N/A;    Social History   Socioeconomic History   Marital status: Divorced    Spouse name: Not on file   Number of children: 3   Years of education: Not on file   Highest education level: Not on file  Occupational History   Occupation: Retired    Fish farm manager: Psychologist, 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[employer]  Tobacco Use   Smoking status: Former    Packs/day: 0.00    Types: Cigarettes   Smokeless tobacco: Never  Vaping Use   Vaping Use: Never used  Substance and Sexual Activity   Alcohol use: No    Alcohol/week: 0.0 standard drinks   Drug use: No   Sexual activity: Never  Other Topics Concern   Not on file  Social History Narrative   Not on file   Social Determinants of Health   Financial Resource Strain: Not on file  Food Insecurity: Not on file  Transportation Needs: Not on file  Physical Activity: Not on file  Stress: Not on file  Social Connections: Not on file  Intimate Partner Violence: Not on file    Family History  Problem Relation Age of Onset   Stroke Mother    Heart disease Mother    Hypertension Mother    Diabetes Mother    Stroke Father    Heart disease Father    Hypertension Father    Diabetes Father    Cancer Sister        colon   Heart disease Sister        pacemaker   Hypertension Sister    Depression Sister    Diabetes Sister    Thyroid disease Sister        uncertain type   Transient ischemic attack Maternal Grandfather    Alcohol abuse Other    Malignant hyperthermia Other     ROS: no fevers or chills, productive cough, hemoptysis, dysphasia, odynophagia, melena, hematochezia, dysuria, hematuria, rash, seizure activity, orthopnea, PND, pedal edema, claudication. Remaining systems are negative.  Physical Exam: Well-developed well-nourished in no acute distress.  Skin is warm and dry.  HEENT is normal.  Neck is supple.  Chest is  clear to auscultation with normal expansion.  Cardiovascular exam is regular rate and rhythm.  Abdominal exam nontender or distended. No masses palpated. Extremities show no edema. neuro grossly intact  ECG- personally reviewed  A/P  1 history of nonischemic cardiomyopathy-LV function has improved on most recent echocardiogram.  We will repeat study.  Continue ARB and beta-blocker.  2 hypertension-blood pressure controlled.  Continue present medications.  3 hyperlipidemia-continue statin.  4 coronary artery disease-patient denies chest pain.  Continue medical therapy with aspirin and statin.  5 chronic combined systolic/diastolic heart failure-she is euvolemic on examination.  Continue Lasix and spironolactone at present dose.  Check potassium and renal function.  6 biventricular ICD-followed by Dr. Lovena Le.  Kirk Ruths, MD

## 2021-03-24 ENCOUNTER — Ambulatory Visit: Payer: Medicare Other | Admitting: Cardiology

## 2021-04-13 ENCOUNTER — Encounter: Payer: Self-pay | Admitting: Internal Medicine

## 2021-04-13 ENCOUNTER — Other Ambulatory Visit: Payer: Self-pay | Admitting: Internal Medicine

## 2021-04-13 ENCOUNTER — Other Ambulatory Visit: Payer: Self-pay

## 2021-04-13 ENCOUNTER — Ambulatory Visit (INDEPENDENT_AMBULATORY_CARE_PROVIDER_SITE_OTHER): Payer: Medicare Other | Admitting: Internal Medicine

## 2021-04-13 VITALS — BP 136/70 | HR 63 | Temp 98.5°F | Ht 65.0 in | Wt 177.6 lb

## 2021-04-13 DIAGNOSIS — Z95810 Presence of automatic (implantable) cardiac defibrillator: Secondary | ICD-10-CM | POA: Diagnosis not present

## 2021-04-13 DIAGNOSIS — I5022 Chronic systolic (congestive) heart failure: Secondary | ICD-10-CM

## 2021-04-13 DIAGNOSIS — R739 Hyperglycemia, unspecified: Secondary | ICD-10-CM

## 2021-04-13 DIAGNOSIS — E7800 Pure hypercholesterolemia, unspecified: Secondary | ICD-10-CM

## 2021-04-13 DIAGNOSIS — E559 Vitamin D deficiency, unspecified: Secondary | ICD-10-CM | POA: Diagnosis not present

## 2021-04-13 DIAGNOSIS — N1831 Chronic kidney disease, stage 3a: Secondary | ICD-10-CM

## 2021-04-13 DIAGNOSIS — J449 Chronic obstructive pulmonary disease, unspecified: Secondary | ICD-10-CM | POA: Diagnosis not present

## 2021-04-13 DIAGNOSIS — E039 Hypothyroidism, unspecified: Secondary | ICD-10-CM

## 2021-04-13 DIAGNOSIS — D500 Iron deficiency anemia secondary to blood loss (chronic): Secondary | ICD-10-CM | POA: Diagnosis not present

## 2021-04-13 DIAGNOSIS — I10 Essential (primary) hypertension: Secondary | ICD-10-CM

## 2021-04-13 DIAGNOSIS — Z0001 Encounter for general adult medical examination with abnormal findings: Secondary | ICD-10-CM

## 2021-04-13 DIAGNOSIS — E538 Deficiency of other specified B group vitamins: Secondary | ICD-10-CM

## 2021-04-13 DIAGNOSIS — K219 Gastro-esophageal reflux disease without esophagitis: Secondary | ICD-10-CM | POA: Diagnosis not present

## 2021-04-13 LAB — CBC WITH DIFFERENTIAL/PLATELET
Basophils Absolute: 0 10*3/uL (ref 0.0–0.1)
Basophils Relative: 1.1 % (ref 0.0–3.0)
Eosinophils Absolute: 0.2 10*3/uL (ref 0.0–0.7)
Eosinophils Relative: 3.9 % (ref 0.0–5.0)
HCT: 35.6 % — ABNORMAL LOW (ref 36.0–46.0)
Hemoglobin: 11.6 g/dL — ABNORMAL LOW (ref 12.0–15.0)
Lymphocytes Relative: 37.2 % (ref 12.0–46.0)
Lymphs Abs: 1.6 10*3/uL (ref 0.7–4.0)
MCHC: 32.4 g/dL (ref 30.0–36.0)
MCV: 84.6 fl (ref 78.0–100.0)
Monocytes Absolute: 0.4 10*3/uL (ref 0.1–1.0)
Monocytes Relative: 8.2 % (ref 3.0–12.0)
Neutro Abs: 2.1 10*3/uL (ref 1.4–7.7)
Neutrophils Relative %: 49.6 % (ref 43.0–77.0)
Platelets: 220 10*3/uL (ref 150.0–400.0)
RBC: 4.21 Mil/uL (ref 3.87–5.11)
RDW: 16.4 % — ABNORMAL HIGH (ref 11.5–15.5)
WBC: 4.3 10*3/uL (ref 4.0–10.5)

## 2021-04-13 LAB — HEPATIC FUNCTION PANEL
ALT: 8 U/L (ref 0–35)
AST: 15 U/L (ref 0–37)
Albumin: 4.1 g/dL (ref 3.5–5.2)
Alkaline Phosphatase: 91 U/L (ref 39–117)
Bilirubin, Direct: 0.1 mg/dL (ref 0.0–0.3)
Total Bilirubin: 0.4 mg/dL (ref 0.2–1.2)
Total Protein: 7.6 g/dL (ref 6.0–8.3)

## 2021-04-13 LAB — BASIC METABOLIC PANEL
BUN: 13 mg/dL (ref 6–23)
CO2: 30 mEq/L (ref 19–32)
Calcium: 10 mg/dL (ref 8.4–10.5)
Chloride: 106 mEq/L (ref 96–112)
Creatinine, Ser: 1.59 mg/dL — ABNORMAL HIGH (ref 0.40–1.20)
GFR: 32.27 mL/min — ABNORMAL LOW (ref >60.00)
Glucose, Bld: 84 mg/dL (ref 70–99)
Potassium: 4.2 mEq/L (ref 3.5–5.1)
Sodium: 143 mEq/L (ref 135–145)

## 2021-04-13 LAB — IBC PANEL
Iron: 82 ug/dL (ref 42–145)
Saturation Ratios: 22.4 % (ref 20.0–50.0)
TIBC: 365.4 ug/dL (ref 250.0–450.0)
Transferrin: 261 mg/dL (ref 212.0–360.0)

## 2021-04-13 LAB — LIPID PANEL
Cholesterol: 174 mg/dL (ref 0–200)
HDL: 40.9 mg/dL (ref >39.00)
LDL Cholesterol: 103 mg/dL — ABNORMAL HIGH (ref 0–99)
NonHDL: 133.03
Total CHOL/HDL Ratio: 4
Triglycerides: 151 mg/dL — ABNORMAL HIGH (ref 0.0–149.0)
VLDL: 30.2 mg/dL (ref 0.0–40.0)

## 2021-04-13 LAB — HEMOGLOBIN A1C: Hgb A1c MFr Bld: 6.4 % (ref 4.6–6.5)

## 2021-04-13 LAB — MICROALBUMIN / CREATININE URINE RATIO
Creatinine,U: 136.1 mg/dL
Microalb Creat Ratio: 0.6 mg/g (ref 0.0–30.0)
Microalb, Ur: 0.8 mg/dL (ref 0.0–1.9)

## 2021-04-13 LAB — URINALYSIS, ROUTINE W REFLEX MICROSCOPIC
Bilirubin Urine: NEGATIVE
Hgb urine dipstick: NEGATIVE
Ketones, ur: NEGATIVE
Leukocytes,Ua: NEGATIVE
Nitrite: NEGATIVE
RBC / HPF: NONE SEEN (ref 0–?)
Specific Gravity, Urine: 1.02 (ref 1.000–1.030)
Total Protein, Urine: NEGATIVE
Urine Glucose: NEGATIVE
Urobilinogen, UA: 0.2 (ref 0.0–1.0)
pH: 5.5 (ref 5.0–8.0)

## 2021-04-13 LAB — TSH: TSH: 64.49 u[IU]/mL — ABNORMAL HIGH (ref 0.35–5.50)

## 2021-04-13 LAB — VITAMIN B12: Vitamin B-12: 221 pg/mL (ref 211–911)

## 2021-04-13 LAB — FERRITIN: Ferritin: 30 ng/mL (ref 10.0–291.0)

## 2021-04-13 LAB — T4, FREE: Free T4: 0.31 ng/dL — ABNORMAL LOW (ref 0.60–1.60)

## 2021-04-13 LAB — VITAMIN D 25 HYDROXY (VIT D DEFICIENCY, FRACTURES): VITD: 18.33 ng/mL — ABNORMAL LOW (ref 30.00–100.00)

## 2021-04-13 MED ORDER — LEVOTHYROXINE SODIUM 75 MCG PO TABS: 75.0000 ug | ORAL_TABLET | Freq: Every day | ORAL | 3 refills | Status: DC

## 2021-04-13 MED ORDER — PANTOPRAZOLE SODIUM 40 MG PO TBEC: 40.0000 mg | DELAYED_RELEASE_TABLET | Freq: Every day | ORAL | 3 refills | Status: DC

## 2021-04-13 MED ORDER — CHOLECALCIFEROL 50 MCG (2000 UT) PO TABS: ORAL_TABLET | ORAL | 99 refills | Status: AC

## 2021-04-13 NOTE — Assessment & Plan Note (Signed)
Lab Results  Component Value Date   LDLCALC 94 12/08/2020   Mild uncontrolled, pt to continue current statin crestor 20 but recheck labs  - consider incresaed crestor with goal ldl < 70

## 2021-04-13 NOTE — Assessment & Plan Note (Signed)
Recent onset worsening for unclear reason, to add protonix, anti-reflux precautions

## 2021-04-13 NOTE — Assessment & Plan Note (Signed)
Lab Results  Component Value Date   HGBA1C 6.7 (H) 12/08/2020   With recent mild worsening, pt to continue current medical treatment  - diet and wt control

## 2021-04-13 NOTE — Assessment & Plan Note (Signed)
BP Readings from Last 3 Encounters:  04/13/21 136/70  12/08/20 (!) 148/76  06/26/19 128/78   Stable, pt to continue medical treatment coreg, losartan

## 2021-04-13 NOTE — Progress Notes (Signed)
Patient ID: [name], female   DOB: 02-13-1951, 71 y.o.   MRN: [id number]         Chief Complaint:: wellness exam and new worsening reflux, low Vit D, low thyorid, hld, dm       HPI:  [name] is a 71 y.o. female here for wellness exam; plans to get covid booster soon, declines shingrix, tdap, o/w up to date                        Also recently with very good med compliance including thyroid; Denies hyper or hypo thyroid symptoms such as voice, skin or hair change.  Taking vit d 1000 u.    Has also had mild worsening reflux and gas belching with chest burning better with antacids, but no other abd pain, dysphagia, n/v, bowel change or blood.  Pt denies other chest pain, increased sob or doe, wheezing, orthopnea, PND, increased LE swelling, palpitations, dizziness or syncope.   Pt denies polydipsia, polyuria, or new focal neuro s/s.   Pt denies fever, wt loss, night sweats, loss of appetite, or other constitutional symptoms   no other new complaints   Wt Readings from Last 3 Encounters:  04/13/21 177 lb 9.6 oz (80.6 kg)  12/08/20 173 lb 9.6 oz (78.7 kg)  02/18/20 166 lb 9.6 oz (75.6 kg)   BP Readings from Last 3 Encounters:  04/13/21 136/70  12/08/20 (!) 148/76  06/26/19 128/78   Immunization History  Administered Date(s) Administered   Fluad Quad(high Dose 65+) 01/29/2019, 12/08/2020   Influenza Split 12/12/2011   Influenza, High Dose Seasonal PF 12/04/2016, 12/10/2017   Influenza,inj,Quad PF,6+ Mos 11/23/2015   Moderna Sars-Covid-2 Vaccination 05/30/2019, 06/30/2019, 01/17/2020   Pneumococcal Conjugate-13 06/02/2014   Pneumococcal Polysaccharide-23 11/23/2015   Td 11/15/2009   There are no preventive care reminders to display for this patient.     Past Medical History:  Diagnosis Date   ALLERGIC RHINITIS 03/17/2008   Anemia, iron deficiency    Blood transfusion complicating pregnancy    after first child at 33 yo   CAD (coronary artery disease)    Nonobstructive  by cardiac catheter 8/12:EF 10-15%, circumflex calcified without obstructive CAD, proximal LAD 20%, mid LAD 20%, proximal RCA 20%, mid RCA 20%.   Cardiomyopathy    a. remote h/o cath with reported nonobs CAD; b. echo 5/12: EF 15%, mild LVH, mild MR, LAE, mod pericardial effusion with increased filling pressures   Cervical disc disease    with recurrent radicular pain, Dr. Montez Morita   COPD (chronic obstructive pulmonary disease) (HCC) 06/14/2010   DJD (degenerative joint disease)    hands   Genital warts    hx   GOITER, MULTINODULAR 12/23/2009   Hyperlipidemia 11/23/2015   Hypertension    Hypothyroid    Myocardial infarction (HCC)    Systolic CHF [facility])    Past Surgical History:  Procedure Laterality Date   CARDIAC CATHETERIZATION  1995   with "timy blockage" [facility] Dr. Daisy Floro   CARDIAC DEFIBRILLATOR PLACEMENT  01-22-11   by Dr.Taylor   CHOLECYSTECTOMY     EP IMPLANTABLE DEVICE N/A 01/24/2016   Procedure: ICD Generator Changeout;  Surgeon: Marinus Maw, MD;  Location: [facility] INVASIVE CV LAB;  Service: Cardiovascular;  Laterality: N/A;   IMPLANTABLE CARDIOVERTER DEFIBRILLATOR IMPLANT N/A 01/22/2011   Procedure: IMPLANTABLE CARDIOVERTER DEFIBRILLATOR IMPLANT;  Surgeon: Marinus Maw, MD;  Location: [facility] CATH LAB;  Service: Cardiovascular;  Laterality: N/A;  reports that she has quit smoking. Her smoking use included cigarettes. She has never used smokeless tobacco. She reports that she does not drink alcohol and does not use drugs. family history includes Alcohol abuse in an other family member; Cancer in her sister; Depression in her sister; Diabetes in her father, mother, and sister; Heart disease in her father, mother, and sister; Hypertension in her father, mother, and sister; Malignant hyperthermia in an other family member; Stroke in her father and mother; Thyroid disease in her sister; Transient ischemic attack in her maternal grandfather. Allergies  Allergen Reactions   Ivp Dye  [Iodinated Contrast Media] Itching and Rash    rash, itching & peel (patient has a MILDER reaction when given w/benadryl)   Latex Rash    Allergic to latex gloves   Penicillins Itching, Swelling, Rash and Other (See Comments)    Has patient had a PCN reaction causing immediate rash, facial/tongue/throat swelling, SOB or lightheadedness with hypotension:Yes Has patient had a PCN reaction causing severe rash involving mucus membranes or skin necrosis:Yes Has patient had a PCN reaction that required hospitalization:Pt. Was inpatient when reaction occurred Has patient had a PCN reaction occurring within the last 10 years: Yes If all of the above answers are "NO", then may proceed with Cephalosporin use.    Strawberry Extract Swelling and Rash        Current Outpatient Medications on File Prior to Visit  Medication Sig Dispense Refill   albuterol (PROVENTIL) (5 MG/ML) 0.5% nebulizer solution Take 0.5 mLs (2.5 mg total) by nebulization every 6 (six) hours as needed for wheezing or shortness of breath. 20 mL 11   albuterol (VENTOLIN HFA) 108 (90 Base) MCG/ACT inhaler Inhale 2 puffs into the lungs every 6 (six) hours as needed for wheezing or shortness of breath. 8 g 11   aspirin 81 MG EC tablet Take 81 mg by mouth daily.     carvedilol (COREG) 25 MG tablet Take 1 tablet (25 mg total) by mouth 2 (two) times daily with a meal. 180 tablet 3   fexofenadine (ALLEGRA) 180 MG tablet Take 180 mg by mouth daily.     furosemide (LASIX) 40 MG tablet Take 1 tablet (40 mg total) by mouth daily. 90 tablet 3   hydrocortisone cream 1 % Apply 1 application topically 3 (three) times daily as needed for itching.     levothyroxine (SYNTHROID) 100 MCG tablet Take 2 tablets (200 mcg total) by mouth daily. 180 tablet 3   levothyroxine (SYNTHROID) 25 MCG tablet Take 1 tablet (25 mcg total) by mouth daily before breakfast. In addition to the 200 mcg 90 tablet 3   losartan (COZAAR) 50 MG tablet Take 1 tablet (50 mg total)  by mouth daily. 90 tablet 3   spironolactone (ALDACTONE) 50 MG tablet Take 1 tablet (50 mg total) by mouth daily. 90 tablet 3   tiotropium (SPIRIVA) 18 MCG inhalation capsule Place 1 capsule (18 mcg total) into inhaler and inhale every morning. 90 capsule 3   triamcinolone (NASACORT) 55 MCG/ACT AERO nasal inhaler Place 1 spray into the nose daily. 1 Inhaler 11   rosuvastatin (CRESTOR) 20 MG tablet Take 1 tablet (20 mg total) by mouth daily. 90 tablet 3   No current facility-administered medications on file prior to visit.        ROS:  All others reviewed and negative.  Objective        PE:  BP 136/70 (BP Location: Right Arm, Patient Position: Sitting, Cuff Size: Large)  Pulse 63    Temp 98.5 F (36.9 C) (Oral)    Ht 5\' 5"  (1.651 m)    Wt 177 lb 9.6 oz (80.6 kg)    SpO2 97%    BMI 29.55 kg/m                 Constitutional: Pt appears in NAD               HENT: Head: NCAT.                Right Ear: External ear normal.                 Left Ear: External ear normal.                Eyes: . Pupils are equal, round, and reactive to light. Conjunctivae and EOM are normal               Nose: without d/c or deformity               Neck: Neck supple. Gross normal ROM               Cardiovascular: Normal rate and regular rhythm.                 Pulmonary/Chest: Effort normal and breath sounds without rales or wheezing.                Abd:  Soft, NT, ND, + BS, no organomegaly               Neurological: Pt is alert. At baseline orientation, motor grossly intact               Skin: Skin is warm. No rashes, no other new lesions, LE edema - none               Psychiatric: Pt behavior is normal without agitation   Micro: none  Cardiac tracings I have personally interpreted today:  none  Pertinent Radiological findings (summarize): none   Lab Results  Component Value Date   WBC 6.1 12/08/2020   HGB 11.6 (L) 12/08/2020   HCT 36.2 12/08/2020   PLT 323.0 12/08/2020   GLUCOSE 93 12/08/2020    CHOL 151 12/08/2020   TRIG 83.0 12/08/2020   HDL 41.10 12/08/2020   LDLDIRECT 92.0 04/24/2017   LDLCALC 94 12/08/2020   ALT 8 12/08/2020   AST 15 12/08/2020   NA 138 12/08/2020   K 4.1 12/08/2020   CL 102 12/08/2020   CREATININE 1.49 (H) 12/08/2020   BUN 13 12/08/2020   CO2 28 12/08/2020   TSH 45.39 (H) 12/08/2020   INR 1.0 01/16/2011   HGBA1C 6.7 (H) 12/08/2020   Assessment/Plan:  [name] is a 71 y.o. Black or African American [2] female with  has a past medical history of ALLERGIC RHINITIS (03/17/2008), Anemia, iron deficiency, Blood transfusion complicating pregnancy, CAD (coronary artery disease), Cardiomyopathy, Cervical disc disease, COPD (chronic obstructive pulmonary disease) (HCC) (06/14/2010), DJD (degenerative joint disease), Genital warts, GOITER, MULTINODULAR (12/23/2009), Hyperlipidemia (11/23/2015), Hypertension, Hypothyroid, Myocardial infarction (HCC), and Systolic CHF (HCC).  Vitamin D deficiency Last vitamin D Lab Results  Component Value Date   VD25OH 14.73 (L) 12/08/2020   Low, to start oral replacement   GERD (gastroesophageal reflux disease) Recent onset worsening for unclear reason, to add protonix, anti-reflux precautions  Automatic implantable cardioverter-defibrillator in situ For f/u with Dr 12/10/2020 soon  Chronic systolic heart failure Lost  to f/u with Dr Jens Som since 2019 - pt to f/u soon (may 2023)  COPD (chronic obstructive pulmonary disease) Stable overall, cont inhaler asd  Encounter for well adult exam with abnormal findings Age and sex appropriate education and counseling updated with regular exercise and diet Referrals for preventative services - none needed Immunizations addressed - declines covid booster, shingrix, tda Smoking counseling  - none needed Evidence for depression or other mood disorder - none significant Most recent labs reviewed. I have personally reviewed and have noted: 1) the patient's medical and social  history 2) The patient's current medications and supplements 3) The patient's height, weight, and BMI have been recorded in the chart   CKD (chronic kidney disease) Lab Results  Component Value Date   CREATININE 1.49 (H) 12/08/2020   With recent mild worsening, for f/u labs today, cont to avoid nephrotoxins, consider renal referral   Essential hypertension BP Readings from Last 3 Encounters:  04/13/21 136/70  12/08/20 (!) 148/76  06/26/19 128/78   Stable, pt to continue medical treatment coreg, losartan   Hyperglycemia Lab Results  Component Value Date   HGBA1C 6.7 (H) 12/08/2020   With recent mild worsening, pt to continue current medical treatment  - diet and wt control   Hyperlipidemia Lab Results  Component Value Date   LDLCALC 94 12/08/2020   Mild uncontrolled, pt to continue current statin crestor 20 but recheck labs  - consider incresaed crestor with goal ldl < 70   Hypothyroidism Lab Results  Component Value Date   TSH 45.39 (H) 12/08/2020    uncontroled pt to continue levothyroxine 100 + 25 daily, recheck labs with recent good compliance   Iron deficiency anemia Non overt bleeding, for lab f/u  Followup: Return in about 8 weeks (around 06/08/2021).  Oliver Barre, MD 04/13/2021 9:05 AM [facility]

## 2021-04-13 NOTE — Assessment & Plan Note (Signed)
For f/u with Dr Ladona Ridgel soon

## 2021-04-13 NOTE — Assessment & Plan Note (Signed)
Lab Results  Component Value Date   CREATININE 1.49 (H) 12/08/2020   With recent mild worsening, for f/u labs today, cont to avoid nephrotoxins, consider renal referral

## 2021-04-13 NOTE — Assessment & Plan Note (Signed)
Last vitamin D Lab Results  Component Value Date   VD25OH 14.73 (L) 12/08/2020   Low, to start oral replacement  

## 2021-04-13 NOTE — Assessment & Plan Note (Signed)
Lab Results  Component Value Date   TSH 45.39 (H) 12/08/2020    uncontroled pt to continue levothyroxine 100 + 25 daily, recheck labs with recent good compliance

## 2021-04-13 NOTE — Patient Instructions (Signed)
Please take all new medication as prescribed - the protonix for acid reflux  Ok to increase the Vitamin D to 2000 units per day  Please continue all other medications as before, and refills have been done if requested.  Please have the pharmacy call with any other refills you may need.  Please continue your efforts at being more active, low cholesterol diet, and weight control.  You are otherwise up to date with prevention measures today.  Please keep your appointments with your specialists as you may have planned - Dr Ladona Ridgel and Dr Jens Som  Please go to the LAB at the blood drawing area for the tests to be done  You will be contacted by phone if any changes need to be made immediately.  Otherwise, you will receive a letter about your results with an explanation, but please check with MyChart first.  Please remember to sign up for MyChart if you have not done so, as this will be important to you in the future with finding out test results, communicating by private email, and scheduling acute appointments online when needed.  Please make an Appointment to return in 6 months, or sooner if needed

## 2021-04-13 NOTE — Assessment & Plan Note (Signed)
Lost to f/u with Dr Jens Som since 2019 - pt to f/u soon (may 2023)

## 2021-04-13 NOTE — Assessment & Plan Note (Signed)
Stable overall, cont inhaler asd

## 2021-04-13 NOTE — Assessment & Plan Note (Signed)
Non overt bleeding, for lab f/u

## 2021-04-13 NOTE — Assessment & Plan Note (Signed)
Age and sex appropriate education and counseling updated with regular exercise and diet Referrals for preventative services - none needed Immunizations addressed - declines covid booster, shingrix, tda Smoking counseling  - none needed Evidence for depression or other mood disorder - none significant Most recent labs reviewed. I have personally reviewed and have noted: 1) the patient's medical and social history 2) The patient's current medications and supplements 3) The patient's height, weight, and BMI have been recorded in the chart

## 2021-05-12 ENCOUNTER — Ambulatory Visit (INDEPENDENT_AMBULATORY_CARE_PROVIDER_SITE_OTHER): Payer: Self-pay

## 2021-05-12 DIAGNOSIS — I428 Other cardiomyopathies: Secondary | ICD-10-CM

## 2021-05-12 LAB — CUP PACEART REMOTE DEVICE CHECK
Battery Remaining Longevity: 40 mo
Battery Voltage: 2.97 V
Brady Statistic AP VP Percent: 18.53 %
Brady Statistic AP VS Percent: 0.01 %
Brady Statistic AS VP Percent: 80.18 %
Brady Statistic AS VS Percent: 1.28 %
Brady Statistic RA Percent Paced: 18.38 %
Brady Statistic RV Percent Paced: 98.12 %
Date Time Interrogation Session: 20230302224423
Implantable Lead Implant Date: 20121112
Implantable Lead Implant Date: 20121112
Implantable Lead Implant Date: 20121112
Implantable Lead Location: 753858
Implantable Lead Location: 753859
Implantable Lead Location: 753860
Implantable Lead Model: 4194
Implantable Lead Model: 5076
Implantable Lead Model: 6947
Implantable Pulse Generator Implant Date: 20171114
Lead Channel Impedance Value: 342 Ohm
Lead Channel Impedance Value: 380 Ohm
Lead Channel Impedance Value: 418 Ohm
Lead Channel Impedance Value: 456 Ohm
Lead Channel Impedance Value: 513 Ohm
Lead Channel Impedance Value: 513 Ohm
Lead Channel Impedance Value: 570 Ohm
Lead Channel Impedance Value: 684 Ohm
Lead Channel Impedance Value: 817 Ohm
Lead Channel Pacing Threshold Amplitude: 0.5 V
Lead Channel Pacing Threshold Amplitude: 0.625 V
Lead Channel Pacing Threshold Pulse Width: 0.4 ms
Lead Channel Pacing Threshold Pulse Width: 0.4 ms
Lead Channel Sensing Intrinsic Amplitude: 3.5 mV
Lead Channel Sensing Intrinsic Amplitude: 3.5 mV
Lead Channel Sensing Intrinsic Amplitude: 8.25 mV
Lead Channel Sensing Intrinsic Amplitude: 8.25 mV
Lead Channel Setting Pacing Amplitude: 1.5 V
Lead Channel Setting Pacing Amplitude: 1.5 V
Lead Channel Setting Pacing Amplitude: 2 V
Lead Channel Setting Pacing Pulse Width: 0.4 ms
Lead Channel Setting Pacing Pulse Width: 1 ms
Lead Channel Setting Sensing Sensitivity: 2.8 mV

## 2021-05-22 NOTE — Progress Notes (Signed)
Remote pacemaker transmission.   

## 2021-05-24 ENCOUNTER — Ambulatory Visit: Payer: Medicare Other | Admitting: Cardiology

## 2021-06-08 ENCOUNTER — Ambulatory Visit (INDEPENDENT_AMBULATORY_CARE_PROVIDER_SITE_OTHER): Payer: Medicare Other

## 2021-06-08 ENCOUNTER — Ambulatory Visit (INDEPENDENT_AMBULATORY_CARE_PROVIDER_SITE_OTHER): Payer: Medicare Other | Admitting: Internal Medicine

## 2021-06-08 ENCOUNTER — Encounter: Payer: Self-pay | Admitting: Internal Medicine

## 2021-06-08 VITALS — BP 136/78 | HR 60 | Temp 98.0°F | Ht 65.0 in | Wt 169.0 lb

## 2021-06-08 DIAGNOSIS — N1831 Chronic kidney disease, stage 3a: Secondary | ICD-10-CM

## 2021-06-08 DIAGNOSIS — R0781 Pleurodynia: Secondary | ICD-10-CM | POA: Diagnosis not present

## 2021-06-08 DIAGNOSIS — I429 Cardiomyopathy, unspecified: Secondary | ICD-10-CM

## 2021-06-08 DIAGNOSIS — I10 Essential (primary) hypertension: Secondary | ICD-10-CM | POA: Diagnosis not present

## 2021-06-08 DIAGNOSIS — E7800 Pure hypercholesterolemia, unspecified: Secondary | ICD-10-CM

## 2021-06-08 DIAGNOSIS — E039 Hypothyroidism, unspecified: Secondary | ICD-10-CM | POA: Diagnosis not present

## 2021-06-08 DIAGNOSIS — E559 Vitamin D deficiency, unspecified: Secondary | ICD-10-CM | POA: Diagnosis not present

## 2021-06-08 DIAGNOSIS — J90 Pleural effusion, not elsewhere classified: Secondary | ICD-10-CM | POA: Diagnosis not present

## 2021-06-08 LAB — TSH: TSH: 4.2 u[IU]/mL (ref 0.35–5.50)

## 2021-06-08 LAB — T4, FREE: Free T4: 0.89 ng/dL (ref 0.60–1.60)

## 2021-06-08 MED ORDER — LEVOTHYROXINE SODIUM 100 MCG PO TABS: 200.0000 ug | ORAL_TABLET | Freq: Every day | ORAL | 3 refills | Status: DC

## 2021-06-08 MED ORDER — LEVOTHYROXINE SODIUM 75 MCG PO TABS: 75.0000 ug | ORAL_TABLET | Freq: Every day | ORAL | 3 refills | Status: DC

## 2021-06-08 MED ORDER — ROSUVASTATIN CALCIUM 20 MG PO TABS: 20.0000 mg | ORAL_TABLET | Freq: Every day | ORAL | 3 refills | Status: DC

## 2021-06-08 NOTE — Progress Notes (Addendum)
Patient ID: [name], female   DOB: 09/16/1950, 71 y.o.   MRN: [id number] ? ? ?    Chief Complaint: follow up low vit d, hypothyorid, hld, htn ckd, pleuritic cp ? ?     HPI:  [name] is a 71 y.o. female here with c/o 3 days onset post lateral right chest pleuritic sharp pain, has been an intermittent mild to mod, worse to breathe deep, o/w no fever, cough and Pt denies other chest pain, increased sob or doe, wheezing, orthopnea, PND, increased LE swelling, palpitations, dizziness or syncope.   Pt denies polydipsia, polyuria, or new focal neuro s/s.    Pt denies fever, wt loss, night sweats, loss of appetite, or other constitutional symptoms  Denies hyper or hypo thyroid symptoms such as voice, skin or hair change.  Not taking Vit D ?      ?Wt Readings from Last 3 Encounters:  ?06/08/21 169 lb (76.7 kg)  ?04/13/21 177 lb 9.6 oz (80.6 kg)  ?12/08/20 173 lb 9.6 oz (78.7 kg)  ? ?BP Readings from Last 3 Encounters:  ?06/08/21 136/78  ?04/13/21 136/70  ?12/08/20 (!) 148/76  ? ?      ?Past Medical History:  ?Diagnosis Date  ? ALLERGIC RHINITIS 03/17/2008  ? Anemia, iron deficiency   ? Blood transfusion complicating pregnancy   ? after first child at 65 yo  ? CAD (coronary artery disease)   ? Nonobstructive by cardiac catheter 8/12:EF 10-15%, circumflex calcified without obstructive CAD, proximal LAD 20%, mid LAD 20%, proximal RCA 20%, mid RCA 20%.  ? Cardiomyopathy   ? a. remote h/o cath with reported nonobs CAD; b. echo 5/12: EF 15%, mild LVH, mild MR, LAE, mod pericardial effusion with increased filling pressures  ? Cervical disc disease   ? with recurrent radicular pain, Dr. Montez Morita  ? COPD (chronic obstructive pulmonary disease) (HCC) 06/14/2010  ? DJD (degenerative joint disease)   ? hands  ? Genital warts   ? hx  ? GOITER, MULTINODULAR 12/23/2009  ? Hyperlipidemia 11/23/2015  ? Hypertension   ? Hypothyroid   ? Myocardial infarction [facility])   ? Systolic CHF (HCC)   ? ?Past Surgical History:  ?Procedure  Laterality Date  ? CARDIAC CATHETERIZATION  1995  ? with "timy blockage" [facility] Dr. Daisy Floro  ? CARDIAC DEFIBRILLATOR PLACEMENT  01-22-11  ? by Dr.Taylor  ? CHOLECYSTECTOMY    ? EP IMPLANTABLE DEVICE N/A 01/24/2016  ? Procedure: ICD Generator Changeout;  Surgeon: Marinus Maw, MD;  Location: [facility] INVASIVE CV LAB;  Service: Cardiovascular;  Laterality: N/A;  ? IMPLANTABLE CARDIOVERTER DEFIBRILLATOR IMPLANT N/A 01/22/2011  ? Procedure: IMPLANTABLE CARDIOVERTER DEFIBRILLATOR IMPLANT;  Surgeon: Marinus Maw, MD;  Location: [facility] CATH LAB;  Service: Cardiovascular;  Laterality: N/A;  ? ? reports that she has quit smoking. Her smoking use included cigarettes. She has never used smokeless tobacco. She reports that she does not drink alcohol and does not use drugs. ?family history includes Alcohol abuse in an other family member; Cancer in her sister; Depression in her sister; Diabetes in her father, mother, and sister; Heart disease in her father, mother, and sister; Hypertension in her father, mother, and sister; Malignant hyperthermia in an other family member; Stroke in her father and mother; Thyroid disease in her sister; Transient ischemic attack in her maternal grandfather. ?Allergies  ?Allergen Reactions  ? Ivp Dye [Iodinated Contrast Media] Itching and Rash  ?  rash, itching & peel (patient has a MILDER reaction when  given w/benadryl)  ? Latex Rash  ?  Allergic to latex gloves  ? Penicillins Itching, Swelling, Rash and Other (See Comments)  ?  Has patient had a PCN reaction causing immediate rash, facial/tongue/throat swelling, SOB or lightheadedness with hypotension:Yes ?Has patient had a PCN reaction causing severe rash involving mucus membranes or skin necrosis:Yes ?Has patient had a PCN reaction that required hospitalization:Pt. Was inpatient when reaction occurred ?Has patient had a PCN reaction occurring within the last 10 years: Yes ?If all of the above answers are "NO", then may proceed with  Cephalosporin use. ?  ? Strawberry Extract Swelling and Rash  ?   ?  ? ?Current Outpatient Medications on File Prior to Visit  ?Medication Sig Dispense Refill  ? albuterol (PROVENTIL) (5 MG/ML) 0.5% nebulizer solution Take 0.5 mLs (2.5 mg total) by nebulization every 6 (six) hours as needed for wheezing or shortness of breath. 20 mL 11  ? albuterol (VENTOLIN HFA) 108 (90 Base) MCG/ACT inhaler Inhale 2 puffs into the lungs every 6 (six) hours as needed for wheezing or shortness of breath. 8 g 11  ? aspirin 81 MG EC tablet Take 81 mg by mouth daily.    ? carvedilol (COREG) 25 MG tablet Take 1 tablet (25 mg total) by mouth 2 (two) times daily with a meal. 180 tablet 3  ? Cholecalciferol 50 MCG (2000 UT) TABS 1 tab by mouth once daily 30 tablet 99  ? fexofenadine (ALLEGRA) 180 MG tablet Take 180 mg by mouth daily.    ? furosemide (LASIX) 40 MG tablet Take 1 tablet (40 mg total) by mouth daily. 90 tablet 3  ? hydrocortisone cream 1 % Apply 1 application topically 3 (three) times daily as needed for itching.    ? pantoprazole (PROTONIX) 40 MG tablet Take 1 tablet (40 mg total) by mouth daily. 90 tablet 3  ? spironolactone (ALDACTONE) 50 MG tablet Take 1 tablet (50 mg total) by mouth daily. 90 tablet 3  ? tiotropium (SPIRIVA) 18 MCG inhalation capsule Place 1 capsule (18 mcg total) into inhaler and inhale every morning. 90 capsule 3  ? triamcinolone (NASACORT) 55 MCG/ACT AERO nasal inhaler Place 1 spray into the nose daily. 1 Inhaler 11  ? losartan (COZAAR) 50 MG tablet Take 1 tablet (50 mg total) by mouth daily. 90 tablet 3  ? ?No current facility-administered medications on file prior to visit.  ? ?     ROS:  All others reviewed and negative. ? ?Objective  ? ?     PE:  BP 136/78 (BP Location: Left Arm, Patient Position: Sitting, Cuff Size: Large)   Pulse 60   Temp 98 ?F (36.7 ?C) (Oral)   Ht 5\' 5"  (1.651 m)   Wt 169 lb (76.7 kg)   SpO2 98%   BMI 28.12 kg/m?  ? ?              Constitutional: Pt appears in NAD ?               HENT: Head: NCAT.  ?              Right Ear: External ear normal.   ?              Left Ear: External ear normal.  ?              Eyes: . Pupils are equal, round, and reactive to light. Conjunctivae and EOM are normal ?  Nose: without d/c or deformity ?              Neck: Neck supple. Gross normal ROM ?              Cardiovascular: Normal rate and regular rhythm.   ?              Pulmonary/Chest: Effort normal and breath sounds without rales or wheezing.  ?              Abd:  Soft, NT, ND, + BS, no organomegaly ?              Neurological: Pt is alert. At baseline orientation, motor grossly intact ?              Skin: Skin is warm. No rashes, no other new lesions, LE edema -  ?              Psychiatric: Pt behavior is normal without agitation  ? ?Micro: none ? ?Cardiac tracings I have personally interpreted today:  none ? ?Pertinent Radiological findings (summarize): none  ? ?Lab Results  ?Component Value Date  ? WBC 4.3 04/13/2021  ? HGB 11.6 (L) 04/13/2021  ? HCT 35.6 (L) 04/13/2021  ? PLT 220.0 04/13/2021  ? GLUCOSE 84 04/13/2021  ? CHOL 174 04/13/2021  ? TRIG 151.0 (H) 04/13/2021  ? HDL 40.90 04/13/2021  ? LDLDIRECT 92.0 04/24/2017  ? LDLCALC 103 (H) 04/13/2021  ? ALT 8 04/13/2021  ? AST 15 04/13/2021  ? NA 143 04/13/2021  ? K 4.2 04/13/2021  ? CL 106 04/13/2021  ? CREATININE 1.59 (H) 04/13/2021  ? BUN 13 04/13/2021  ? CO2 30 04/13/2021  ? TSH 4.20 06/08/2021  ? INR 1.0 01/16/2011  ? HGBA1C 6.4 04/13/2021  ? MICROALBUR 0.8 04/13/2021  ? ?Assessment/Plan:  ?GAYTHA GERFEN is a 71 y.o. Black or African American [2] female with  has a past medical history of ALLERGIC RHINITIS (03/17/2008), Anemia, iron deficiency, Blood transfusion complicating pregnancy, CAD (coronary artery disease), Cardiomyopathy, Cervical disc disease, COPD (chronic obstructive pulmonary disease) (HCC) (06/14/2010), DJD (degenerative joint disease), Genital warts, GOITER, MULTINODULAR (12/23/2009), Hyperlipidemia (11/23/2015),  Hypertension, Hypothyroid, Myocardial infarction (HCC), and Systolic CHF (HCC). ? ?Vitamin D deficiency ?Last vitamin D ?Lab Results  ?Component Value Date  ? VD25OH 18.33 (L) 04/13/2021  ? ?Low, to start oral replacement ?

## 2021-06-08 NOTE — Patient Instructions (Addendum)
Your Mychart ID username is [email or web address] and the Password is [phone] (unless you have to change the password again) ? ?Please continue all other medications as before, and refills have been done if requested. ? ?Please have the pharmacy call with any other refills you may need. ? ?Please continue your efforts at being more active, low cholesterol diet, and weight control. ? ?You are otherwise up to date with prevention measures today. ? ?Please keep your appointments with your specialists as you may have planned ? ?Please go to the XRAY Department in the first floor for the x-ray testing ? ?Please go to the LAB at the blood drawing area for the tests to be done ? ?You will be contacted by phone if any changes need to be made immediately.  Otherwise, you will receive a letter about your results with an explanation, but please check with MyChart first. ? ?Please remember to sign up for MyChart if you have not done so, as this will be important to you in the future with finding out test results, communicating by private email, and scheduling acute appointments online when needed. ? ?Please make an Appointment to return in 6 months, or sooner if needed ? ?

## 2021-06-08 NOTE — Assessment & Plan Note (Signed)
Last vitamin D Lab Results  Component Value Date   VD25OH 18.33 (L) 04/13/2021   Low, to start oral replacement  

## 2021-06-09 ENCOUNTER — Encounter: Payer: Self-pay | Admitting: Internal Medicine

## 2021-06-09 DIAGNOSIS — R0781 Pleurodynia: Secondary | ICD-10-CM | POA: Insufficient documentation

## 2021-06-10 NOTE — Assessment & Plan Note (Signed)
Lab Results  ?Component Value Date  ? LDLCALC 103 (H) 04/13/2021  ? ?Mild uncontrolled, pt to continue current statin crestor 20 as declines change ? ?

## 2021-06-10 NOTE — Assessment & Plan Note (Addendum)
?   msk - for cxr, r/o pna, refer sport med ?

## 2021-06-10 NOTE — Assessment & Plan Note (Signed)
BP Readings from Last 3 Encounters:  ?06/08/21 136/78  ?04/13/21 136/70  ?12/08/20 (!) 148/76  ? ?Stable, pt to continue medical treatment coreg ? ?

## 2021-06-10 NOTE — Assessment & Plan Note (Signed)
Lab Results  ?Component Value Date  ? TSH 4.20 06/08/2021  ? ?Stable, pt to continue levothyroxine ? ?

## 2021-06-10 NOTE — Assessment & Plan Note (Signed)
Lab Results  Component Value Date   CREATININE 1.59 (H) 04/13/2021   Stable overall, cont to avoid nephrotoxins  

## 2021-06-13 ENCOUNTER — Ambulatory Visit: Payer: Medicare Other | Admitting: Internal Medicine

## 2021-06-15 ENCOUNTER — Encounter: Payer: Medicare Other | Admitting: Internal Medicine

## 2021-06-19 ENCOUNTER — Encounter: Payer: Self-pay | Admitting: Family Medicine

## 2021-06-27 ENCOUNTER — Ambulatory Visit: Payer: Medicare Other | Admitting: Cardiology

## 2021-08-03 ENCOUNTER — Ambulatory Visit: Payer: Medicare Other | Admitting: Cardiology

## 2021-09-21 ENCOUNTER — Ambulatory Visit (INDEPENDENT_AMBULATORY_CARE_PROVIDER_SITE_OTHER): Payer: Self-pay

## 2021-09-21 DIAGNOSIS — I428 Other cardiomyopathies: Secondary | ICD-10-CM

## 2021-09-21 LAB — CUP PACEART REMOTE DEVICE CHECK
Battery Remaining Longevity: 37 mo
Battery Voltage: 2.96 V
Brady Statistic AP VP Percent: 19.48 %
Brady Statistic AP VS Percent: 0.01 %
Brady Statistic AS VP Percent: 78.1 %
Brady Statistic AS VS Percent: 2.4 %
Brady Statistic RA Percent Paced: 19.21 %
Brady Statistic RV Percent Paced: 96.59 %
Date Time Interrogation Session: 20230712181918
Implantable Lead Implant Date: 20121112
Implantable Lead Implant Date: 20121112
Implantable Lead Implant Date: 20121112
Implantable Lead Location: 753858
Implantable Lead Location: 753859
Implantable Lead Location: 753860
Implantable Lead Model: 4194
Implantable Lead Model: 5076
Implantable Lead Model: 6947
Implantable Pulse Generator Implant Date: 20171114
Lead Channel Impedance Value: 342 Ohm
Lead Channel Impedance Value: 361 Ohm
Lead Channel Impedance Value: 418 Ohm
Lead Channel Impedance Value: 437 Ohm
Lead Channel Impedance Value: 475 Ohm
Lead Channel Impedance Value: 532 Ohm
Lead Channel Impedance Value: 589 Ohm
Lead Channel Impedance Value: 684 Ohm
Lead Channel Impedance Value: 817 Ohm
Lead Channel Pacing Threshold Amplitude: 0.5 V
Lead Channel Pacing Threshold Amplitude: 0.75 V
Lead Channel Pacing Threshold Pulse Width: 0.4 ms
Lead Channel Pacing Threshold Pulse Width: 0.4 ms
Lead Channel Sensing Intrinsic Amplitude: 3.375 mV
Lead Channel Sensing Intrinsic Amplitude: 3.375 mV
Lead Channel Sensing Intrinsic Amplitude: 7.25 mV
Lead Channel Sensing Intrinsic Amplitude: 7.25 mV
Lead Channel Setting Pacing Amplitude: 1.5 V
Lead Channel Setting Pacing Amplitude: 1.5 V
Lead Channel Setting Pacing Amplitude: 2 V
Lead Channel Setting Pacing Pulse Width: 0.4 ms
Lead Channel Setting Pacing Pulse Width: 1 ms
Lead Channel Setting Sensing Sensitivity: 2.8 mV

## 2021-10-06 ENCOUNTER — Ambulatory Visit: Payer: Medicare Other | Admitting: Cardiology

## 2021-10-06 NOTE — Progress Notes (Signed)
Remote pacemaker transmission.   

## 2021-10-31 ENCOUNTER — Telehealth: Payer: Self-pay | Admitting: *Deleted

## 2021-10-31 NOTE — Patient Outreach (Signed)
  Care Coordination   10/31/2021 Name: [name] MRN: [id number] DOB: Sep 01, 1950   Care Coordination Outreach Attempts:  An unsuccessful telephone outreach was attempted today to offer the patient information about available care coordination services as a benefit of their health plan.   Follow Up Plan:  Additional outreach attempts will be made to offer the patient care coordination information and services.   Encounter Outcome:  No Answer  Care Coordination Interventions Activated:  No   Care Coordination Interventions:  No, not indicated    Blanchie Serve RN, BSN [facility] Care Management [facility] [phone] [email or web address]

## 2021-11-06 ENCOUNTER — Ambulatory Visit: Payer: Self-pay

## 2021-11-06 NOTE — Patient Outreach (Signed)
  Care Coordination   11/06/2021 Name: [name] MRN: [id number] DOB: 07/02/1950   Care Coordination Outreach Attempts:  A second unsuccessful outreach was attempted today to offer the patient with information about available care coordination services as a benefit of their health plan.     Follow Up Plan:  Additional outreach attempts will be made to offer the patient care coordination information and services.   Encounter Outcome:  No Answer  Care Coordination Interventions Activated:  No   Care Coordination Interventions:  No, not indicated    Kathyrn Sheriff, RN, MSN, BSN, CCM Care Coordinator [phone]

## 2021-11-15 ENCOUNTER — Telehealth: Payer: Self-pay

## 2021-11-15 NOTE — Patient Outreach (Signed)
  Care Coordination   11/15/2021 Name: [name] MRN: [id number] DOB: 07-16-1950   Care Coordination Outreach Attempts:  A third unsuccessful outreach was attempted today to offer the patient with information about available care coordination services as a benefit of their health plan.   Follow Up Plan:  No further outreach attempts will be made at this time. We have been unable to contact the patient to offer or enroll patient in care coordination services  Encounter Outcome:  No Answer  Care Coordination Interventions Activated:  No   Care Coordination Interventions:  No, not indicated    Kathyrn Sheriff, RN, MSN, BSN, CCM Care Coordinator [phone]

## 2021-12-11 ENCOUNTER — Encounter: Payer: Self-pay | Admitting: Internal Medicine

## 2021-12-11 ENCOUNTER — Ambulatory Visit (INDEPENDENT_AMBULATORY_CARE_PROVIDER_SITE_OTHER): Payer: Medicare Other | Admitting: Internal Medicine

## 2021-12-11 VITALS — BP 126/74 | HR 65 | Temp 98.0°F | Ht 65.0 in | Wt 181.0 lb

## 2021-12-11 DIAGNOSIS — R739 Hyperglycemia, unspecified: Secondary | ICD-10-CM | POA: Diagnosis not present

## 2021-12-11 DIAGNOSIS — N1831 Chronic kidney disease, stage 3a: Secondary | ICD-10-CM | POA: Diagnosis not present

## 2021-12-11 DIAGNOSIS — E039 Hypothyroidism, unspecified: Secondary | ICD-10-CM | POA: Diagnosis not present

## 2021-12-11 DIAGNOSIS — E559 Vitamin D deficiency, unspecified: Secondary | ICD-10-CM

## 2021-12-11 DIAGNOSIS — E7800 Pure hypercholesterolemia, unspecified: Secondary | ICD-10-CM

## 2021-12-11 DIAGNOSIS — I10 Essential (primary) hypertension: Secondary | ICD-10-CM

## 2021-12-11 LAB — LIPID PANEL
Cholesterol: 117 mg/dL (ref 0–200)
HDL: 31.3 mg/dL — ABNORMAL LOW (ref >39.00)
LDL Cholesterol: 60 mg/dL (ref 0–99)
NonHDL: 86.13
Total CHOL/HDL Ratio: 4
Triglycerides: 130 mg/dL (ref 0.0–149.0)
VLDL: 26 mg/dL (ref 0.0–40.0)

## 2021-12-11 LAB — BASIC METABOLIC PANEL
BUN: 14 mg/dL (ref 6–23)
CO2: 25 mEq/L (ref 19–32)
Calcium: 9.8 mg/dL (ref 8.4–10.5)
Chloride: 105 mEq/L (ref 96–112)
Creatinine, Ser: 1.32 mg/dL — ABNORMAL HIGH (ref 0.40–1.20)
GFR: 40.15 mL/min — ABNORMAL LOW (ref >60.00)
Glucose, Bld: 107 mg/dL — ABNORMAL HIGH (ref 70–99)
Potassium: 3.7 mEq/L (ref 3.5–5.1)
Sodium: 138 mEq/L (ref 135–145)

## 2021-12-11 LAB — HEPATIC FUNCTION PANEL
ALT: 17 U/L (ref 0–35)
AST: 17 U/L (ref 0–37)
Albumin: 4 g/dL (ref 3.5–5.2)
Alkaline Phosphatase: 103 U/L (ref 39–117)
Bilirubin, Direct: 0.1 mg/dL (ref 0.0–0.3)
Total Bilirubin: 0.3 mg/dL (ref 0.2–1.2)
Total Protein: 7.7 g/dL (ref 6.0–8.3)

## 2021-12-11 LAB — TSH: TSH: 0.68 u[IU]/mL (ref 0.35–5.50)

## 2021-12-11 LAB — T4, FREE: Free T4: 1.51 ng/dL (ref 0.60–1.60)

## 2021-12-11 LAB — VITAMIN D 25 HYDROXY (VIT D DEFICIENCY, FRACTURES): VITD: 29.68 ng/mL — ABNORMAL LOW (ref 30.00–100.00)

## 2021-12-11 LAB — HEMOGLOBIN A1C: Hgb A1c MFr Bld: 6.9 % — ABNORMAL HIGH (ref 4.6–6.5)

## 2021-12-11 MED ORDER — ALBUTEROL SULFATE HFA 108 (90 BASE) MCG/ACT IN AERS: 2.0000 | INHALATION_SPRAY | Freq: Four times a day (QID) | RESPIRATORY_TRACT | 11 refills | Status: DC | PRN

## 2021-12-11 NOTE — Assessment & Plan Note (Signed)
Lab Results  Component Value Date   LDLCALC 103 (H) 04/13/2021   Uncontrolled, goal ldl < 70, pt declines change crestor 20 mg for now, but for f/u lab today

## 2021-12-11 NOTE — Assessment & Plan Note (Signed)
BP Readings from Last 3 Encounters:  12/11/21 126/74  06/08/21 136/78  04/13/21 136/70   Stable, pt to continue medical treatment coreg 25 bid

## 2021-12-11 NOTE — Assessment & Plan Note (Signed)
Lab Results  Component Value Date   TSH 4.20 06/08/2021   Stable, pt to continue levothyroxine 200 + 75 mcg qd

## 2021-12-11 NOTE — Assessment & Plan Note (Signed)
Last vitamin D Lab Results  Component Value Date   VD25OH 18.33 (L) 04/13/2021   Low, to start oral replacement

## 2021-12-11 NOTE — Patient Instructions (Addendum)
Please have your Shingrix (shingles) shots done at your local pharmacy.  You had the flu shot today  Please continue all other medications as before, and refills have been done if requested - the inhaler  Please have the pharmacy call with any other refills you may need.  Please continue your efforts at being more active, low cholesterol diet, and weight control..  Please keep your appointments with your specialists as you may have planned  Please go to the LAB at the blood drawing area for the tests to be done  You will be contacted by phone if any changes need to be made immediately.  Otherwise, you will receive a letter about your results with an explanation, but please check with MyChart first.  Please remember to sign up for MyChart if you have not done so, as this will be important to you in the future with finding out test results, communicating by private email, and scheduling acute appointments online when needed.  Please make an Appointment to return in 6 months, or sooner if needed

## 2021-12-11 NOTE — Assessment & Plan Note (Signed)
Lab Results  Component Value Date   HGBA1C 6.4 04/13/2021   Stable, pt to continue current medical treatment  - diet, wt control, excercise  

## 2021-12-11 NOTE — Progress Notes (Signed)
Patient ID: [name], female   DOB: 07/05/1950, 71 y.o.   MRN: [id number]        Chief Complaint: follow up HTN, HLD and hyperglycemia . ckd       HPI:  [name] is a 71 y.o. female here with c/o being out of proair, needs refill.  Pt denies chest pain, increased sob or doe, wheezing, orthopnea, PND, increased LE swelling, palpitations, dizziness or syncope.   Pt denies polydipsia, polyuria, or new focal neuro s/s.    Pt denies fever, wt loss, night sweats, loss of appetite, or other constitutional symptoms  Due for flu shot.  Does not want covid booster.         Wt Readings from Last 3 Encounters:  12/11/21 181 lb (82.1 kg)  06/08/21 169 lb (76.7 kg)  04/13/21 177 lb 9.6 oz (80.6 kg)   BP Readings from Last 3 Encounters:  12/11/21 126/74  06/08/21 136/78  04/13/21 136/70         Past Medical History:  Diagnosis Date   ALLERGIC RHINITIS 03/17/2008   Anemia, iron deficiency    Blood transfusion complicating pregnancy    after first child at 41 yo   CAD (coronary artery disease)    Nonobstructive by cardiac catheter 8/12:EF 10-15%, circumflex calcified without obstructive CAD, proximal LAD 20%, mid LAD 20%, proximal RCA 20%, mid RCA 20%.   Cardiomyopathy    a. remote h/o cath with reported nonobs CAD; b. echo 5/12: EF 15%, mild LVH, mild MR, LAE, mod pericardial effusion with increased filling pressures   Cervical disc disease    with recurrent radicular pain, Dr. Eulas Post   COPD (chronic obstructive pulmonary disease) ([place]) 06/14/2010   DJD (degenerative joint disease)    hands   Genital warts    hx   GOITER, MULTINODULAR 12/23/2009   Hyperlipidemia 11/23/2015   Hypertension    Hypothyroid    Myocardial infarction ([place])    Systolic CHF [facility])    Past Surgical History:  Procedure Laterality Date   [place]   with "timy blockage" [facility] Dr. Sherald Barge   CARDIAC DEFIBRILLATOR PLACEMENT  01-22-11   by Dr.Taylor   CHOLECYSTECTOMY     EP  IMPLANTABLE DEVICE N/A 01/24/2016   Procedure: [place];  Surgeon: Evans Lance, MD;  Location: [place] CV LAB;  Service: Cardiovascular;  Laterality: N/A;   IMPLANTABLE CARDIOVERTER DEFIBRILLATOR IMPLANT N/A 01/22/2011   Procedure: IMPLANTABLE CARDIOVERTER DEFIBRILLATOR IMPLANT;  Surgeon: Evans Lance, MD;  Location: [facility] CATH LAB;  Service: Cardiovascular;  Laterality: N/A;    reports that she has quit smoking. Her smoking use included cigarettes. She has never used smokeless tobacco. She reports that she does not drink alcohol and does not use drugs. family history includes Alcohol abuse in an other family member; Cancer in her sister; Depression in her sister; Diabetes in her father, mother, and sister; Heart disease in her father, mother, and sister; Hypertension in her father, mother, and sister; Malignant hyperthermia in an other family member; Stroke in her father and mother; Thyroid disease in her sister; Transient ischemic attack in her maternal grandfather. Allergies  Allergen Reactions   Ivp Dye [Iodinated Contrast Media] Itching and Rash    rash, itching & peel (patient has a MILDER reaction when given w/benadryl)   Latex Rash    Allergic to latex gloves   Penicillins Itching, Swelling, Rash and Other (See Comments)    Has patient had a PCN  reaction causing immediate rash, facial/tongue/throat swelling, SOB or lightheadedness with hypotension:Yes Has patient had a PCN reaction causing severe rash involving mucus membranes or skin necrosis:Yes Has patient had a PCN reaction that required hospitalization:Pt. Was inpatient when reaction occurred Has patient had a PCN reaction occurring within the last 10 years: Yes If all of the above answers are "NO", then may proceed with Cephalosporin use.    Strawberry Extract Swelling and Rash        Current Outpatient Medications on File Prior to Visit  Medication Sig Dispense Refill   aspirin 81 MG EC tablet Take 81 mg  by mouth daily.     carvedilol (COREG) 25 MG tablet Take 1 tablet (25 mg total) by mouth 2 (two) times daily with a meal. 180 tablet 3   Cholecalciferol 50 MCG (2000 UT) TABS 1 tab by mouth once daily 30 tablet 99   fexofenadine (ALLEGRA) 180 MG tablet Take 180 mg by mouth daily.     furosemide (LASIX) 40 MG tablet Take 1 tablet (40 mg total) by mouth daily. 90 tablet 3   hydrocortisone cream 1 % Apply 1 application topically 3 (three) times daily as needed for itching.     levothyroxine (SYNTHROID) 100 MCG tablet Take 2 tablets (200 mcg total) by mouth daily. 180 tablet 3   levothyroxine (SYNTHROID) 75 MCG tablet Take 1 tablet (75 mcg total) by mouth daily. 90 tablet 3   pantoprazole (PROTONIX) 40 MG tablet Take 1 tablet (40 mg total) by mouth daily. 90 tablet 3   spironolactone (ALDACTONE) 50 MG tablet Take 1 tablet (50 mg total) by mouth daily. 90 tablet 3   tiotropium (SPIRIVA) 18 MCG inhalation capsule Place 1 capsule (18 mcg total) into inhaler and inhale every morning. 90 capsule 3   triamcinolone (NASACORT) 55 MCG/ACT AERO nasal inhaler Place 1 spray into the nose daily. 1 Inhaler 11   losartan (COZAAR) 50 MG tablet Take 1 tablet (50 mg total) by mouth daily. 90 tablet 3   rosuvastatin (CRESTOR) 20 MG tablet Take 1 tablet (20 mg total) by mouth daily. 90 tablet 3   No current facility-administered medications on file prior to visit.        ROS:  All others reviewed and negative.  Objective        PE:  BP 126/74 (BP Location: Left Arm, Patient Position: Sitting, Cuff Size: Large)   Pulse 65   Temp 98 F (36.7 C) (Oral)   Ht 5\' 5"  (1.651 m)   Wt 181 lb (82.1 kg)   SpO2 94%   BMI 30.12 kg/m                 Constitutional: Pt appears in NAD               HENT: Head: NCAT.                Right Ear: External ear normal.                 Left Ear: External ear normal.                Eyes: . Pupils are equal, round, and reactive to light. Conjunctivae and EOM are normal                Nose: without d/c or deformity               Neck: Neck supple. Gross normal ROM  Cardiovascular: Normal rate and regular rhythm.                 Pulmonary/Chest: Effort normal and breath sounds without rales or wheezing.                Abd:  Soft, NT, ND, + BS, no organomegaly               Neurological: Pt is alert. At baseline orientation, motor grossly intact               Skin: Skin is warm. No rashes, no other new lesions, LE edema - none               Psychiatric: Pt behavior is normal without agitation   Micro: none  Cardiac tracings I have personally interpreted today:  none  Pertinent Radiological findings (summarize): none   Lab Results  Component Value Date   WBC 4.3 04/13/2021   HGB 11.6 (L) 04/13/2021   HCT 35.6 (L) 04/13/2021   PLT 220.0 04/13/2021   GLUCOSE 84 04/13/2021   CHOL 174 04/13/2021   TRIG 151.0 (H) 04/13/2021   HDL 40.90 04/13/2021   LDLDIRECT 92.0 04/24/2017   LDLCALC 103 (H) 04/13/2021   ALT 8 04/13/2021   AST 15 04/13/2021   NA 143 04/13/2021   K 4.2 04/13/2021   CL 106 04/13/2021   CREATININE 1.59 (H) 04/13/2021   BUN 13 04/13/2021   CO2 30 04/13/2021   TSH 4.20 06/08/2021   INR 1.0 01/16/2011   HGBA1C 6.4 04/13/2021   MICROALBUR 0.8 04/13/2021   Assessment/Plan:  TYSHIKA GUIDRY is a 71 y.o. Black or African American [2] female with  has a past medical history of ALLERGIC RHINITIS (03/17/2008), Anemia, iron deficiency, Blood transfusion complicating pregnancy, CAD (coronary artery disease), Cardiomyopathy, Cervical disc disease, COPD (chronic obstructive pulmonary disease) ([place]) (06/14/2010), DJD (degenerative joint disease), Genital warts, GOITER, MULTINODULAR (12/23/2009), Hyperlipidemia (11/23/2015), Hypertension, Hypothyroid, Myocardial infarction ([place]), and Systolic CHF ([place]).  Vitamin D deficiency Last vitamin D Lab Results  Component Value Date   VD25OH 18.33 (L) 04/13/2021   Low, to start oral  replacement   Hyperlipidemia Lab Results  Component Value Date   LDLCALC 103 (H) 04/13/2021   Uncontrolled, goal ldl < 70, pt declines change crestor 20 mg for now, but for f/u lab today   Hyperglycemia Lab Results  Component Value Date   HGBA1C 6.4 04/13/2021   Stable, pt to continue current medical treatment  - diet , wt control, excercise   Essential hypertension BP Readings from Last 3 Encounters:  12/11/21 126/74  06/08/21 136/78  04/13/21 136/70   Stable, pt to continue medical treatment coreg 25 bid   CKD (chronic kidney disease) Lab Results  Component Value Date   CREATININE 1.59 (H) 04/13/2021   Stable overall, cont to avoid nephrotoxins   Hypothyroidism Lab Results  Component Value Date   TSH 4.20 06/08/2021   Stable, pt to continue levothyroxine 200 + 75 mcg qd   Followup: Return in about 6 months (around 06/12/2022).  Cathlean Cower, MD 12/11/2021 11:07 AM [facility]

## 2021-12-11 NOTE — Assessment & Plan Note (Signed)
Lab Results  Component Value Date   CREATININE 1.59 (H) 04/13/2021   Stable overall, cont to avoid nephrotoxins

## 2021-12-14 ENCOUNTER — Other Ambulatory Visit: Payer: Self-pay

## 2021-12-14 ENCOUNTER — Encounter (HOSPITAL_COMMUNITY): Payer: Self-pay | Admitting: Emergency Medicine

## 2021-12-14 ENCOUNTER — Emergency Department (HOSPITAL_COMMUNITY): Payer: Medicare Other

## 2021-12-14 ENCOUNTER — Emergency Department (HOSPITAL_COMMUNITY)
Admission: EM | Admit: 2021-12-14 | Discharge: 2021-12-15 | Disposition: A | Discharge status: Home / Self Care | Payer: Medicare Other | Attending: Emergency Medicine | Admitting: Emergency Medicine

## 2021-12-14 DIAGNOSIS — M62838 Other muscle spasm: Secondary | ICD-10-CM

## 2021-12-14 DIAGNOSIS — Z91040 Latex allergy status: Secondary | ICD-10-CM | POA: Insufficient documentation

## 2021-12-14 DIAGNOSIS — M79605 Pain in left leg: Secondary | ICD-10-CM | POA: Diagnosis not present

## 2021-12-14 DIAGNOSIS — N189 Chronic kidney disease, unspecified: Secondary | ICD-10-CM | POA: Diagnosis not present

## 2021-12-14 DIAGNOSIS — I130 Hypertensive heart and chronic kidney disease with heart failure and stage 1 through stage 4 chronic kidney disease, or unspecified chronic kidney disease: Secondary | ICD-10-CM | POA: Insufficient documentation

## 2021-12-14 DIAGNOSIS — M79602 Pain in left arm: Secondary | ICD-10-CM | POA: Diagnosis not present

## 2021-12-14 DIAGNOSIS — M25512 Pain in left shoulder: Secondary | ICD-10-CM | POA: Diagnosis not present

## 2021-12-14 DIAGNOSIS — G459 Transient cerebral ischemic attack, unspecified: Secondary | ICD-10-CM | POA: Diagnosis not present

## 2021-12-14 DIAGNOSIS — I5020 Unspecified systolic (congestive) heart failure: Secondary | ICD-10-CM | POA: Insufficient documentation

## 2021-12-14 DIAGNOSIS — W19XXXA Unspecified fall, initial encounter: Secondary | ICD-10-CM | POA: Diagnosis not present

## 2021-12-14 DIAGNOSIS — R531 Weakness: Secondary | ICD-10-CM | POA: Insufficient documentation

## 2021-12-14 DIAGNOSIS — Z743 Need for continuous supervision: Secondary | ICD-10-CM | POA: Diagnosis not present

## 2021-12-14 DIAGNOSIS — I2510 Atherosclerotic heart disease of native coronary artery without angina pectoris: Secondary | ICD-10-CM | POA: Diagnosis not present

## 2021-12-14 DIAGNOSIS — M25562 Pain in left knee: Secondary | ICD-10-CM | POA: Insufficient documentation

## 2021-12-14 DIAGNOSIS — Z95810 Presence of automatic (implantable) cardiac defibrillator: Secondary | ICD-10-CM | POA: Diagnosis not present

## 2021-12-14 DIAGNOSIS — R29898 Other symptoms and signs involving the musculoskeletal system: Secondary | ICD-10-CM | POA: Diagnosis not present

## 2021-12-14 DIAGNOSIS — I10 Essential (primary) hypertension: Secondary | ICD-10-CM | POA: Diagnosis not present

## 2021-12-14 DIAGNOSIS — Z7951 Long term (current) use of inhaled steroids: Secondary | ICD-10-CM | POA: Insufficient documentation

## 2021-12-14 DIAGNOSIS — E039 Hypothyroidism, unspecified: Secondary | ICD-10-CM | POA: Diagnosis not present

## 2021-12-14 DIAGNOSIS — Z79899 Other long term (current) drug therapy: Secondary | ICD-10-CM | POA: Insufficient documentation

## 2021-12-14 DIAGNOSIS — R29810 Facial weakness: Secondary | ICD-10-CM | POA: Diagnosis not present

## 2021-12-14 DIAGNOSIS — J449 Chronic obstructive pulmonary disease, unspecified: Secondary | ICD-10-CM | POA: Insufficient documentation

## 2021-12-14 DIAGNOSIS — Z7982 Long term (current) use of aspirin: Secondary | ICD-10-CM | POA: Diagnosis not present

## 2021-12-14 DIAGNOSIS — R262 Difficulty in walking, not elsewhere classified: Secondary | ICD-10-CM | POA: Diagnosis not present

## 2021-12-14 LAB — CBC
HCT: 39.4 % (ref 36.0–46.0)
Hemoglobin: 12.8 g/dL (ref 12.0–15.0)
MCH: 28.2 pg (ref 26.0–34.0)
MCHC: 32.5 g/dL (ref 30.0–36.0)
MCV: 86.8 fL (ref 80.0–100.0)
Platelets: 302 10*3/uL (ref 150–400)
RBC: 4.54 MIL/uL (ref 3.87–5.11)
RDW: 14.2 % (ref 11.5–15.5)
WBC: 5 10*3/uL (ref 4.0–10.5)
nRBC: 0.4 % — ABNORMAL HIGH (ref 0.0–0.2)

## 2021-12-14 LAB — I-STAT CHEM 8, ED
BUN: 19 mg/dL (ref 8–23)
Calcium, Ion: 1.23 mmol/L (ref 1.15–1.40)
Chloride: 103 mmol/L (ref 98–111)
Creatinine, Ser: 1.4 mg/dL — ABNORMAL HIGH (ref 0.44–1.00)
Glucose, Bld: 79 mg/dL (ref 70–99)
HCT: 40 % (ref 36.0–46.0)
Hemoglobin: 13.6 g/dL (ref 12.0–15.0)
Potassium: 3.5 mmol/L (ref 3.5–5.1)
Sodium: 140 mmol/L (ref 135–145)
TCO2: 27 mmol/L (ref 22–32)

## 2021-12-14 LAB — COMPREHENSIVE METABOLIC PANEL
ALT: 18 U/L (ref 0–44)
AST: 23 U/L (ref 15–41)
Albumin: 3.8 g/dL (ref 3.5–5.0)
Alkaline Phosphatase: 98 U/L (ref 38–126)
Anion gap: 10 (ref 5–15)
BUN: 17 mg/dL (ref 8–23)
CO2: 27 mmol/L (ref 22–32)
Calcium: 9.8 mg/dL (ref 8.9–10.3)
Chloride: 102 mmol/L (ref 98–111)
Creatinine, Ser: 1.38 mg/dL — ABNORMAL HIGH (ref 0.44–1.00)
GFR, Estimated: 40 mL/min — ABNORMAL LOW (ref >60)
Glucose, Bld: 87 mg/dL (ref 70–99)
Potassium: 3.6 mmol/L (ref 3.5–5.1)
Sodium: 139 mmol/L (ref 135–145)
Total Bilirubin: 0.7 mg/dL (ref 0.3–1.2)
Total Protein: 7.9 g/dL (ref 6.5–8.1)

## 2021-12-14 LAB — APTT: aPTT: 34 seconds (ref 24–36)

## 2021-12-14 LAB — DIFFERENTIAL
Abs Immature Granulocytes: 0.02 10*3/uL (ref 0.00–0.07)
Basophils Absolute: 0 10*3/uL (ref 0.0–0.1)
Basophils Relative: 1 %
Eosinophils Absolute: 0.2 10*3/uL (ref 0.0–0.5)
Eosinophils Relative: 4 %
Immature Granulocytes: 0 %
Lymphocytes Relative: 38 %
Lymphs Abs: 1.9 10*3/uL (ref 0.7–4.0)
Monocytes Absolute: 0.5 10*3/uL (ref 0.1–1.0)
Monocytes Relative: 10 %
Neutro Abs: 2.4 10*3/uL (ref 1.7–7.7)
Neutrophils Relative %: 47 %

## 2021-12-14 LAB — PROTIME-INR
INR: 1.1 (ref 0.8–1.2)
Prothrombin Time: 13.9 seconds (ref 11.4–15.2)

## 2021-12-14 LAB — ETHANOL: Alcohol, Ethyl (B): <10 mg/dL (ref <10)

## 2021-12-14 NOTE — ED Triage Notes (Signed)
Per EMS, since Tuesday morning pt has not been able to move her left leg, left arm drift, left, facial droop, and left weakness.  No headache, vision issues.    LVO 2  158/72 60 paced 97% RA CBG 93 18G L AC  Last known well was Monday around 9:20pm when she went to bed.  She tried to get up around 7:45am when she tried to get up she found she could not walk, usually walks 35min a day.

## 2021-12-14 NOTE — ED Provider Triage Note (Signed)
Emergency Medicine Provider Triage Evaluation Note  [name] , a 71 y.o. female  was evaluated in triage.  Pt complains of left lower extremity and left upper extremity weakness.  Patient reports this began on Tuesday however she also states that she is unsure if the symptoms began Monday.  The patient states that she believes she was having a stroke however was unsure if her symptoms were due to a flu shot that she received on Monday.  The patient reports that she fell today while trying to walk to the bus stop.  Patient reports that she typically walks 30 minutes a day.  The patient has 1 out of 5 grip strength her left upper extremity, 1 out of 5 strength to left lower extremity.  Patient has no obvious facial droop, slurred speech.  Review of Systems  Positive:  Negative:   Physical Exam  BP (!) 146/72 (BP Location: Left Arm)   Pulse 63   Temp 98.7 F (37.1 C) (Oral)   Resp 18   SpO2 100%  Gen:   Awake, no distress   Resp:  Normal effort  MSK:   Moves extremities without difficulty  Other:    Medical Decision Making  Medically screening exam initiated at 8:25 PM.  Appropriate orders placed.  [name] was informed that the remainder of the evaluation will be completed by another provider, this initial triage assessment does not replace that evaluation, and the importance of remaining in the ED until their evaluation is complete.     Azucena Cecil, PA-C 12/14/21 2026

## 2021-12-15 ENCOUNTER — Emergency Department (HOSPITAL_COMMUNITY): Payer: Medicare Other

## 2021-12-15 DIAGNOSIS — M25562 Pain in left knee: Secondary | ICD-10-CM | POA: Diagnosis not present

## 2021-12-15 LAB — TROPONIN I (HIGH SENSITIVITY): Troponin I (High Sensitivity): 14 ng/L (ref <18)

## 2021-12-15 MED ORDER — LIDOCAINE VISCOUS HCL 2 % MT SOLN: 10.0000 mL | Freq: Four times a day (QID) | OROMUCOSAL | 0 refills | Status: AC | PRN

## 2021-12-15 MED ORDER — ALUM & MAG HYDROXIDE-SIMETH 200-200-20 MG/5ML PO SUSP
30.0000 mL | Freq: Once | ORAL | Status: AC
  Administered 2021-12-15: 30 mL via ORAL
  Filled 2021-12-15: qty 30

## 2021-12-15 MED ORDER — ACETAMINOPHEN 500 MG PO TABS
1000.0000 mg | ORAL_TABLET | Freq: Once | ORAL | Status: AC
  Administered 2021-12-15: 1000 mg via ORAL
  Filled 2021-12-15: qty 2

## 2021-12-15 MED ORDER — MAALOX MAX 400-400-40 MG/5ML PO SUSP: 10.0000 mL | Freq: Four times a day (QID) | ORAL | 0 refills | Status: AC | PRN

## 2021-12-15 MED ORDER — LIDOCAINE VISCOUS HCL 2 % MT SOLN
15.0000 mL | Freq: Once | OROMUCOSAL | Status: AC
  Administered 2021-12-15: 15 mL via ORAL
  Filled 2021-12-15: qty 15

## 2021-12-15 MED ORDER — METHOCARBAMOL 500 MG PO TABS: 1000.0000 mg | ORAL_TABLET | Freq: Every day | ORAL | 0 refills | Status: DC

## 2021-12-15 MED ORDER — KETOROLAC TROMETHAMINE 15 MG/ML IJ SOLN
7.5000 mg | Freq: Once | INTRAMUSCULAR | Status: AC
  Administered 2021-12-15: 7.5 mg via INTRAVENOUS
  Filled 2021-12-15: qty 1

## 2021-12-15 NOTE — ED Notes (Signed)
Patient verbalizes understanding of d/c instructions. Opportunities for questions and answers were provided. Pt d/c from ED and wheeled to lobby with family.  

## 2021-12-15 NOTE — ED Notes (Signed)
Heat packs applied to pt's lower and upper back.

## 2021-12-15 NOTE — Discharge Instructions (Addendum)
You may use over-the-counter Acetaminophen (Tylenol), topical muscle creams such as SalonPas, Icy Hot, Bengay, etc. Please stretch, apply ice or heat (whichever helps), and have massage therapy for additional assistance.  For pain control you may take at 1000 mg of Tylenol every 8 hours as needed.  

## 2021-12-15 NOTE — ED Provider Notes (Signed)
[facility] EMERGENCY DEPARTMENT Provider Note  CSN: [id number] Arrival date & time: 12/14/21 2014  Chief Complaint(s) No chief complaint on file.  HPI URA KEPHART is a 71 y.o. female with a past medical history listed below who presents to the emergency department with 2 days of left-sided upper back, left arm, left leg pain limiting her mobility.  She reports that this began the day after having a influenza vaccine in the left arm.  Patient denies any headache, visual disturbance.  No change in speech. no fevers or chills.  No new chest pain or shortness of breath.  Patient does report a history of recurrent sharp chest pain attributed to acid reflux which she gets every day and improves with walking around and sitting up.  She denies any nausea or vomiting.  No abdominal pain.  Patient also reports that her left knee feels like it is bending backwards.  This caused her to fall yesterday.  Denies any head trauma or loss of consciousness.  Patient is having difficulty weightbearing on that leg.  The history is provided by the patient.    Past Medical History Past Medical History:  Diagnosis Date   ALLERGIC RHINITIS 03/17/2008   Anemia, iron deficiency    Blood transfusion complicating pregnancy    after first child at 41 yo   CAD (coronary artery disease)    Nonobstructive by cardiac catheter 8/12:EF 10-15%, circumflex calcified without obstructive CAD, proximal LAD 20%, mid LAD 20%, proximal RCA 20%, mid RCA 20%.   Cardiomyopathy    a. remote h/o cath with reported nonobs CAD; b. echo 5/12: EF 15%, mild LVH, mild MR, LAE, mod pericardial effusion with increased filling pressures   Cervical disc disease    with recurrent radicular pain, Dr. Montez Morita   COPD (chronic obstructive pulmonary disease) (HCC) 06/14/2010   DJD (degenerative joint disease)    hands   Genital warts    hx   GOITER, MULTINODULAR 12/23/2009   Hyperlipidemia 11/23/2015   Hypertension     Hypothyroid    Myocardial infarction [facility])    Systolic CHF [facility])    Patient Active Problem List   Diagnosis Date Noted   Pleuritic chest pain 06/09/2021   GERD (gastroesophageal reflux disease) 04/13/2021   COPD exacerbation (HCC) 12/08/2020   Vitamin D deficiency 12/08/2020   Wheezing 04/24/2017   Cough 04/24/2017   Hyperglycemia 12/04/2016   CKD (chronic kidney disease) 06/05/2016   Myocardial infarction [facility])    Left cervical radiculopathy 11/23/2015   Hyperlipidemia 11/23/2015   Bilateral shoulder pain 06/13/2011   Thrush 06/13/2011   Automatic implantable cardioverter-defibrillator in situ 02/26/2011   CAD (coronary artery disease)    Hypothyroidism 08/30/2010   Chest pain 07/07/2010   Chronic systolic heart failure (HCC) 06/14/2010   COPD (chronic obstructive pulmonary disease) (HCC) 06/14/2010   Encounter for well adult exam with abnormal findings 06/14/2010   Non-ischemic cardiomyopathy (HCC)    GOITER, MULTINODULAR 12/23/2009   Other dysphagia 03/02/2009   WEIGHT LOSS 04/29/2008   Iron deficiency anemia 03/17/2008   Essential hypertension 03/17/2008   ALLERGIC RHINITIS 03/17/2008   Home Medication(s) Prior to Admission medications   Medication Sig Start Date End Date Taking? Authorizing Provider  alum & mag hydroxide-simeth (MAALOX MAX) 400-400-40 MG/5ML suspension Take 10 mLs by mouth every 6 (six) hours as needed for indigestion. 12/15/21  Yes Raylee Strehl, Amadeo Garnet, MD  lidocaine (XYLOCAINE) 2 % solution Use as directed 10 mLs in the mouth or throat every 6 (  six) hours as needed (stomach pain). 12/15/21  Yes Philip Eckersley, Grayce Sessions, MD  methocarbamol (ROBAXIN) 500 MG tablet Take 2 tablets (1,000 mg total) by mouth at bedtime. 12/15/21  Yes Oma Marzan, Grayce Sessions, MD  albuterol (VENTOLIN HFA) 108 (90 Base) MCG/ACT inhaler Inhale 2 puffs into the lungs every 6 (six) hours as needed for wheezing or shortness of breath. 12/11/21   Biagio Borg, MD  aspirin 81 MG EC tablet  Take 81 mg by mouth daily.    [provider]  carvedilol (COREG) 25 MG tablet Take 1 tablet (25 mg total) by mouth 2 (two) times daily with a meal. 12/08/20   Biagio Borg, MD  Cholecalciferol 50 MCG (2000 UT) TABS 1 tab by mouth once daily 04/13/21   Biagio Borg, MD  fexofenadine (ALLEGRA) 180 MG tablet Take 180 mg by mouth daily.    [provider]  furosemide (LASIX) 40 MG tablet Take 1 tablet (40 mg total) by mouth daily. 12/08/20   Biagio Borg, MD  hydrocortisone cream 1 % Apply 1 application topically 3 (three) times daily as needed for itching.    [provider]  levothyroxine (SYNTHROID) 100 MCG tablet Take 2 tablets (200 mcg total) by mouth daily. 06/08/21   Biagio Borg, MD  levothyroxine (SYNTHROID) 75 MCG tablet Take 1 tablet (75 mcg total) by mouth daily. 06/08/21   Biagio Borg, MD  losartan (COZAAR) 50 MG tablet Take 1 tablet (50 mg total) by mouth daily. 12/08/20 04/13/21  Biagio Borg, MD  pantoprazole (PROTONIX) 40 MG tablet Take 1 tablet (40 mg total) by mouth daily. 04/13/21   Biagio Borg, MD  rosuvastatin (CRESTOR) 20 MG tablet Take 1 tablet (20 mg total) by mouth daily. 06/08/21 09/06/21  Biagio Borg, MD  spironolactone (ALDACTONE) 50 MG tablet Take 1 tablet (50 mg total) by mouth daily. 12/08/20   Biagio Borg, MD  tiotropium [facility]) 18 MCG inhalation capsule Place 1 capsule (18 mcg total) into inhaler and inhale every morning. 12/08/20   Biagio Borg, MD  triamcinolone (NASACORT) 55 MCG/ACT AERO nasal inhaler Place 1 spray into the nose daily. 07/14/18   Biagio Borg, MD                                                                                                                                    Allergies Ivp dye [iodinated contrast media], Latex, Penicillins, and Strawberry extract  Review of Systems Review of Systems As noted in HPI  Physical Exam Vital Signs  I have reviewed the triage vital signs BP 113/67   Pulse 63   Temp 98.4  F (36.9 C) (Oral)   Resp 18   Ht 5\' 5"  (1.651 m)   Wt 82.1 kg   SpO2 98%   BMI 30.12 kg/m   Physical Exam Vitals reviewed.  Constitutional:      General:  She is not in acute distress.    Appearance: She is well-developed. She is not diaphoretic.  HENT:     Head: Normocephalic and atraumatic.     Nose: Nose normal.  Eyes:     General: No scleral icterus.       Right eye: No discharge.        Left eye: No discharge.     Conjunctiva/sclera: Conjunctivae normal.     Pupils: Pupils are equal, round, and reactive to light.  Cardiovascular:     Rate and Rhythm: Normal rate and regular rhythm.     Heart sounds: No murmur heard.    No friction rub. No gallop.  Pulmonary:     Effort: Pulmonary effort is normal. No respiratory distress.     Breath sounds: Normal breath sounds. No stridor. No rales.  Abdominal:     General: There is no distension.     Palpations: Abdomen is soft.     Tenderness: There is no abdominal tenderness.  Musculoskeletal:     Cervical back: Normal range of motion and neck supple. Spasms and tenderness present. No bony tenderness.       Back:  Skin:    General: Skin is warm and dry.     Findings: No erythema or rash.  Neurological:     Mental Status: She is alert and oriented to person, place, and time.     Comments: Mental Status:  Alert and oriented to person, place, and time.  Attention and concentration normal.  Speech clear.  Recent memory is intact  Cranial Nerves:  II Visual Fields: Intact to confrontation. Visual fields intact. III, IV, VI: Pupils equal and reactive to light and near. Full eye movement without nystagmus  V Facial Sensation: Normal. No weakness of masticatory muscles  VII: No facial weakness or asymmetry  VIII Auditory Acuity: Grossly normal  IX/X: The uvula is midline; the palate elevates symmetrically  XI: Normal sternocleidomastoid and trapezius strength  XII: The tongue is midline. No atrophy or fasciculations.    Motor System: Muscle Strength: 5/5 in right upper and lower extremities.  Left upper and lower extremity strength was 3/5 but limited by pain.   Muscle Tone: Tone and muscle bulk are normal in the upper and lower extremities.  Coordination: Intact finger-to-nose. No tremor.  Sensation: Intact to light touch.        ED Results and Treatments Labs (all labs ordered are listed, but only abnormal results are displayed) Labs Reviewed  CBC - Abnormal; Notable for the following components:      Result Value   nRBC 0.4 (*)    All other components within normal limits  COMPREHENSIVE METABOLIC PANEL - Abnormal; Notable for the following components:   Creatinine, Ser 1.38 (*)    GFR, Estimated 40 (*)    All other components within normal limits  I-STAT CHEM 8, ED - Abnormal; Notable for the following components:   Creatinine, Ser 1.40 (*)    All other components within normal limits  ETHANOL  PROTIME-INR  APTT  DIFFERENTIAL  TROPONIN I (HIGH SENSITIVITY)  EKG  EKG Interpretation  Date/Time:  Thursday December 14 2021 20:41:48 EDT Ventricular Rate:  60 PR Interval:  148 QRS Duration: 158 QT Interval:  484 QTC Calculation: 484 R Axis:   -63 Text Interpretation: AV dual-paced rhythm Abnormal ECG When compared with ECG of 24-Jan-2016 06:07, PREVIOUS ECG IS PRESENT Confirmed by Drema Pry [phone]) on 12/15/2021 2:33:33 AM       Radiology No results found.  Medications Ordered in ED Medications  ketorolac (TORADOL) 15 MG/ML injection 7.5 mg (7.5 mg Intravenous Given 12/15/21 0237)  alum & mag hydroxide-simeth (MAALOX/MYLANTA) 200-200-20 MG/5ML suspension 30 mL (30 mLs Oral Given 12/15/21 0308)    And  lidocaine (XYLOCAINE) 2 % viscous mouth solution 15 mL (15 mLs Oral Given 12/15/21 0308)  acetaminophen (TYLENOL) tablet 1,000 mg (1,000 mg Oral Given 12/15/21  7628)                                                                                                                                     Procedures Procedures  (including critical care time)  Medical Decision Making / ED Course   Medical Decision Making Amount and/or Complexity of Data Reviewed Labs: ordered. Decision-making details documented in ED Course. Radiology: ordered and independent interpretation performed. Decision-making details documented in ED Course.  Risk OTC drugs. Prescription drug management. Decision regarding hospitalization.    Patient presented with right upper and lower extremity pain and weakness following influenza shot. Patient's weakness improved after trigger point pressure was applied to the left trapezius which relax the musculature.  This is apparently improved for pain and allowed her to demonstrate her strength which is now 5 out of 5 in the left upper and lower extremity.  She does have persistent pain but it minimally affects her strength. Given this I have lower concern for CVA. CT scan was obtained and negative for ICH nor old or subacute stroke. CBC reassuring without leukocytosis or anemia Metabolic panel without significant electrolyte derangements. stable renal function.  Given the left knee pain and buckling, plain film was obtained and negative for any acute fracture or dislocation.  On attempt to ambulate patient she did have buckling of her left knee.  She was provided with a brace which allowed her to ambulate with minimal assistance.  Patient did not require admission for further work-up and management.  Felt to be stable for discharge.      Final Clinical Impression(s) / ED Diagnoses Final diagnoses:  Muscle spasm of left shoulder area  Acute pain of left knee   The patient appears reasonably screened and/or stabilized for discharge and I doubt any other medical condition or other [facility] requiring further screening, evaluation, or  treatment in the ED at this time. I have discussed the findings, Dx and Tx plan with the patient/family who expressed understanding and agree(s) with the plan. Discharge instructions discussed at length. The patient/family was given strict return precautions who  verbalized understanding of the instructions. No further questions at time of discharge.  Disposition: Discharge  Condition: Good  ED Discharge Orders          Ordered    alum & mag hydroxide-simeth (MAALOX MAX) 400-400-40 MG/5ML suspension  Every 6 hours PRN        12/15/21 0632    lidocaine (XYLOCAINE) 2 % solution  Every 6 hours PRN        12/15/21 0632    methocarbamol (ROBAXIN) 500 MG tablet  Daily at bedtime        12/15/21 0932           Follow Up: Corwin Levins, MD [address] [place] [address] [phone]  Call  to schedule an appointment for close follow up  Yolonda Kida, MD [address] [place] [address] [place] [address] [phone]  Call  to schedule an appointment for close follow up           This chart was dictated using voice recognition software.  Despite best efforts to proofread,  errors can occur which can change the documentation meaning.    Nira Conn, MD 12/16/21 [phone]

## 2021-12-20 ENCOUNTER — Telehealth: Payer: Self-pay

## 2021-12-20 NOTE — Telephone Encounter (Signed)
        Patient  visited [place] on 10/6     Telephone encounter attempt :  1st  A HIPAA compliant voice message was left requesting a return call.  Instructed patient to call back   Benedetta Sundstrom [facility] Care Guide, [place], Care Management  [phone] [address], [place], [place] [address] Phone: [phone] Email: [email or web address]       

## 2021-12-21 ENCOUNTER — Ambulatory Visit (INDEPENDENT_AMBULATORY_CARE_PROVIDER_SITE_OTHER): Payer: Medicare Other

## 2021-12-21 DIAGNOSIS — I428 Other cardiomyopathies: Secondary | ICD-10-CM

## 2021-12-21 LAB — CUP PACEART REMOTE DEVICE CHECK
Battery Remaining Longevity: 35 mo
Battery Voltage: 2.95 V
Brady Statistic AP VP Percent: 15.98 %
Brady Statistic AP VS Percent: 0.01 %
Brady Statistic AS VP Percent: 81.91 %
Brady Statistic AS VS Percent: 2.1 %
Brady Statistic RA Percent Paced: 15.8 %
Brady Statistic RV Percent Paced: 97.07 %
Date Time Interrogation Session: 20231012141257
Implantable Lead Implant Date: 20121112
Implantable Lead Implant Date: 20121112
Implantable Lead Implant Date: 20121112
Implantable Lead Location: 753858
Implantable Lead Location: 753859
Implantable Lead Location: 753860
Implantable Lead Model: 4194
Implantable Lead Model: 5076
Implantable Lead Model: 6947
Implantable Pulse Generator Implant Date: 20171114
Lead Channel Impedance Value: 342 Ohm
Lead Channel Impedance Value: 380 Ohm
Lead Channel Impedance Value: 418 Ohm
Lead Channel Impedance Value: 418 Ohm
Lead Channel Impedance Value: 475 Ohm
Lead Channel Impedance Value: 513 Ohm
Lead Channel Impedance Value: 551 Ohm
Lead Channel Impedance Value: 665 Ohm
Lead Channel Impedance Value: 798 Ohm
Lead Channel Pacing Threshold Amplitude: 0.375 V
Lead Channel Pacing Threshold Amplitude: 0.875 V
Lead Channel Pacing Threshold Pulse Width: 0.4 ms
Lead Channel Pacing Threshold Pulse Width: 0.4 ms
Lead Channel Sensing Intrinsic Amplitude: 3.75 mV
Lead Channel Sensing Intrinsic Amplitude: 3.75 mV
Lead Channel Sensing Intrinsic Amplitude: 7.25 mV
Lead Channel Sensing Intrinsic Amplitude: 7.25 mV
Lead Channel Setting Pacing Amplitude: 1.5 V
Lead Channel Setting Pacing Amplitude: 1.5 V
Lead Channel Setting Pacing Amplitude: 2 V
Lead Channel Setting Pacing Pulse Width: 0.4 ms
Lead Channel Setting Pacing Pulse Width: 1 ms
Lead Channel Setting Sensing Sensitivity: 2.8 mV

## 2021-12-22 ENCOUNTER — Telehealth (INDEPENDENT_AMBULATORY_CARE_PROVIDER_SITE_OTHER): Payer: Medicare Other | Admitting: Internal Medicine

## 2021-12-22 ENCOUNTER — Encounter: Payer: Self-pay | Admitting: Internal Medicine

## 2021-12-22 DIAGNOSIS — N1831 Chronic kidney disease, stage 3a: Secondary | ICD-10-CM

## 2021-12-22 DIAGNOSIS — R739 Hyperglycemia, unspecified: Secondary | ICD-10-CM

## 2021-12-22 DIAGNOSIS — M25562 Pain in left knee: Secondary | ICD-10-CM | POA: Diagnosis not present

## 2021-12-22 NOTE — Progress Notes (Signed)
Patient ID: [name], female   DOB: 1950/10/16, 71 y.o.   MRN: [id number]  Virtual Visit via Video Note  I connected with [name] on 12/22/21 at 10:20 AM EDT by a video enabled telemedicine application and verified that I am speaking with the correct person using two identifiers.  Location of all participants today Patient: at home Provider: at office   I discussed the limitations of evaluation and management by telemedicine and the availability of in person appointments. The patient expressed understanding and agreed to proceed.  History of Present Illness: Here after seen in ED yesterday with fall due to left knee buckling and pain swelling, also with muscle strain to shoulder.  Pt still with pain but will call ortho today.  Pt denies chest pain, increased sob or doe, wheezing, orthopnea, PND, increased LE swelling, palpitations, dizziness or syncope.   Pt denies polydipsia, polyuria, or new focal neuro s/s.  Working on heat and ice, has immobilizer for left knee.  No further falls, has walker at home  No other new complaints. Past Medical History:  Diagnosis Date   ALLERGIC RHINITIS 03/17/2008   Anemia, iron deficiency    Blood transfusion complicating pregnancy    after first child at 66 yo   CAD (coronary artery disease)    Nonobstructive by cardiac catheter 8/12:EF 10-15%, circumflex calcified without obstructive CAD, proximal LAD 20%, mid LAD 20%, proximal RCA 20%, mid RCA 20%.   Cardiomyopathy    a. remote h/o cath with reported nonobs CAD; b. echo 5/12: EF 15%, mild LVH, mild MR, LAE, mod pericardial effusion with increased filling pressures   Cervical disc disease    with recurrent radicular pain, Dr. Montez Morita   COPD (chronic obstructive pulmonary disease) (HCC) 06/14/2010   DJD (degenerative joint disease)    hands   Genital warts    hx   GOITER, MULTINODULAR 12/23/2009   Hyperlipidemia 11/23/2015   Hypertension    Hypothyroid    Myocardial infarction (HCC)     Systolic CHF [facility])    Past Surgical History:  Procedure Laterality Date   CARDIAC CATHETERIZATION  1995   with "timy blockage" [facility] Dr. Daisy Floro   CARDIAC DEFIBRILLATOR PLACEMENT  01-22-11   by Dr.Taylor   CHOLECYSTECTOMY     EP IMPLANTABLE DEVICE N/A 01/24/2016   Procedure: ICD Generator Changeout;  Surgeon: Marinus Maw, MD;  Location: [facility] INVASIVE CV LAB;  Service: Cardiovascular;  Laterality: N/A;   IMPLANTABLE CARDIOVERTER DEFIBRILLATOR IMPLANT N/A 01/22/2011   Procedure: IMPLANTABLE CARDIOVERTER DEFIBRILLATOR IMPLANT;  Surgeon: Marinus Maw, MD;  Location: [facility] CATH LAB;  Service: Cardiovascular;  Laterality: N/A;    reports that she has quit smoking. Her smoking use included cigarettes. She has never used smokeless tobacco. She reports that she does not drink alcohol and does not use drugs. family history includes Alcohol abuse in an other family member; Cancer in her sister; Depression in her sister; Diabetes in her father, mother, and sister; Heart disease in her father, mother, and sister; Hypertension in her father, mother, and sister; Malignant hyperthermia in an other family member; Stroke in her father and mother; Thyroid disease in her sister; Transient ischemic attack in her maternal grandfather. Allergies  Allergen Reactions   Ivp Dye [Iodinated Contrast Media] Itching and Rash    rash, itching & peel (patient has a MILDER reaction when given w/benadryl)   Latex Rash    Allergic to latex gloves   Penicillins Itching, Swelling, Rash and Other (See  Comments)    Has patient had a PCN reaction causing immediate rash, facial/tongue/throat swelling, SOB or lightheadedness with hypotension:Yes Has patient had a PCN reaction causing severe rash involving mucus membranes or skin necrosis:Yes Has patient had a PCN reaction that required hospitalization:Pt. Was inpatient when reaction occurred Has patient had a PCN reaction occurring within the last 10 years: Yes If all of  the above answers are "NO", then may proceed with Cephalosporin use.    Strawberry Extract Swelling and Rash        Current Outpatient Medications on File Prior to Visit  Medication Sig Dispense Refill   albuterol (VENTOLIN HFA) 108 (90 Base) MCG/ACT inhaler Inhale 2 puffs into the lungs every 6 (six) hours as needed for wheezing or shortness of breath. 8 g 11   alum & mag hydroxide-simeth (MAALOX MAX) 400-400-40 MG/5ML suspension Take 10 mLs by mouth every 6 (six) hours as needed for indigestion. 355 mL 0   aspirin 81 MG EC tablet Take 81 mg by mouth daily.     carvedilol (COREG) 25 MG tablet Take 1 tablet (25 mg total) by mouth 2 (two) times daily with a meal. 180 tablet 3   Cholecalciferol 50 MCG (2000 UT) TABS 1 tab by mouth once daily 30 tablet 99   fexofenadine (ALLEGRA) 180 MG tablet Take 180 mg by mouth daily.     furosemide (LASIX) 40 MG tablet Take 1 tablet (40 mg total) by mouth daily. 90 tablet 3   hydrocortisone cream 1 % Apply 1 application topically 3 (three) times daily as needed for itching.     levothyroxine (SYNTHROID) 100 MCG tablet Take 2 tablets (200 mcg total) by mouth daily. 180 tablet 3   levothyroxine (SYNTHROID) 75 MCG tablet Take 1 tablet (75 mcg total) by mouth daily. 90 tablet 3   lidocaine (XYLOCAINE) 2 % solution Use as directed 10 mLs in the mouth or throat every 6 (six) hours as needed (stomach pain). 100 mL 0   losartan (COZAAR) 50 MG tablet Take 1 tablet (50 mg total) by mouth daily. 90 tablet 3   methocarbamol (ROBAXIN) 500 MG tablet Take 2 tablets (1,000 mg total) by mouth at bedtime. 20 tablet 0   pantoprazole (PROTONIX) 40 MG tablet Take 1 tablet (40 mg total) by mouth daily. 90 tablet 3   rosuvastatin (CRESTOR) 20 MG tablet Take 1 tablet (20 mg total) by mouth daily. 90 tablet 3   spironolactone (ALDACTONE) 50 MG tablet Take 1 tablet (50 mg total) by mouth daily. 90 tablet 3   tiotropium (SPIRIVA) 18 MCG inhalation capsule Place 1 capsule (18 mcg  total) into inhaler and inhale every morning. 90 capsule 3   triamcinolone (NASACORT) 55 MCG/ACT AERO nasal inhaler Place 1 spray into the nose daily. 1 Inhaler 11   No current facility-administered medications on file prior to visit.    Observations/Objective: Alert, NAD, appropriate mood and affect, resps normal, cn 2-12 intact, moves all 4s, no visible rash or swelling Lab Results  Component Value Date   WBC 5.0 12/14/2021   HGB 13.6 12/14/2021   HCT 40.0 12/14/2021   PLT 302 12/14/2021   GLUCOSE 79 12/14/2021   CHOL 117 12/11/2021   TRIG 130.0 12/11/2021   HDL 31.30 (L) 12/11/2021   LDLDIRECT 92.0 04/24/2017   LDLCALC 60 12/11/2021   ALT 18 12/14/2021   AST 23 12/14/2021   NA 140 12/14/2021   K 3.5 12/14/2021   CL 103 12/14/2021   CREATININE 1.40 (H)  12/14/2021   BUN 19 12/14/2021   CO2 27 12/14/2021   TSH 0.68 12/11/2021   INR 1.1 12/14/2021   HGBA1C 6.9 (H) 12/11/2021   MICROALBUR 0.8 04/13/2021   Assessment and Plan: See notes  Follow Up Instructions: See notes   I discussed the assessment and treatment plan with the patient. The patient was provided an opportunity to ask questions and all were answered. The patient agreed with the plan and demonstrated an understanding of the instructions.   The patient was advised to call back or seek an in-person evaluation if the symptoms worsen or if the condition fails to improve as anticipated.   Cathlean Cower, MD

## 2021-12-22 NOTE — Assessment & Plan Note (Signed)
Hx exam suggestive of ligamentous injury; pt to continue heat, ice, f/u ortho, cont immobilizer, also ok for Volt Gel topical prn otc

## 2021-12-22 NOTE — Assessment & Plan Note (Signed)
Lab Results  Component Value Date   CREATININE 1.40 (H) 12/14/2021   Stable overall, cont to avoid nephrotoxins

## 2021-12-22 NOTE — Assessment & Plan Note (Signed)
Lab Results  Component Value Date   HGBA1C 6.9 (H) 12/11/2021   Stable, pt to continue current medical treatment - diet, wt control, excercise

## 2021-12-22 NOTE — Patient Instructions (Signed)
Ok to try topical OTC Voltaren gel as needed  Please continue all other medications as before  Please have the pharmacy call with any other refills you may need.  Please continue your efforts at being more active, low cholesterol diet, and weight control.  Please keep your appointments with your specialists as you may have planned - orthopedic

## 2021-12-28 NOTE — Progress Notes (Signed)
Remote pacemaker transmission.   

## 2022-01-09 NOTE — Progress Notes (Deleted)
HPI: FU dilated cardiomyopathy. She was in the hospital in March 2012 with severe hypothyroidism and CHF. Followup echocardiogram in June 2012 demonstrated continued LV dysfunction with an EF of 15%. Cardiac catheterization was performed 10/25/10: EF 10-15%, circumflex calcified without obstructive CAD, proximal LAD 20%, mid LAD 20%, proximal RCA 20%, mid RCA 20%. She was felt to have mild CAD and a nonischemic cardiomyopathy. Also with h/o BIV ICD. Echocardiogram September 2020 showed ejection fraction 55 to 60%, grade 1 diastolic dysfunction. Since I last saw her,   Current Outpatient Medications  Medication Sig Dispense Refill   albuterol (VENTOLIN HFA) 108 (90 Base) MCG/ACT inhaler Inhale 2 puffs into the lungs every 6 (six) hours as needed for wheezing or shortness of breath. 8 g 11   alum & mag hydroxide-simeth (MAALOX MAX) 400-400-40 MG/5ML suspension Take 10 mLs by mouth every 6 (six) hours as needed for indigestion. 355 mL 0   aspirin 81 MG EC tablet Take 81 mg by mouth daily.     carvedilol (COREG) 25 MG tablet Take 1 tablet (25 mg total) by mouth 2 (two) times daily with a meal. 180 tablet 3   Cholecalciferol 50 MCG (2000 UT) TABS 1 tab by mouth once daily 30 tablet 99   fexofenadine (ALLEGRA) 180 MG tablet Take 180 mg by mouth daily.     furosemide (LASIX) 40 MG tablet Take 1 tablet (40 mg total) by mouth daily. 90 tablet 3   hydrocortisone cream 1 % Apply 1 application topically 3 (three) times daily as needed for itching.     levothyroxine (SYNTHROID) 100 MCG tablet Take 2 tablets (200 mcg total) by mouth daily. 180 tablet 3   levothyroxine (SYNTHROID) 75 MCG tablet Take 1 tablet (75 mcg total) by mouth daily. 90 tablet 3   lidocaine (XYLOCAINE) 2 % solution Use as directed 10 mLs in the mouth or throat every 6 (six) hours as needed (stomach pain). 100 mL 0   losartan (COZAAR) 50 MG tablet Take 1 tablet (50 mg total) by mouth daily. 90 tablet 3   methocarbamol (ROBAXIN) 500 MG  tablet Take 2 tablets (1,000 mg total) by mouth at bedtime. 20 tablet 0   pantoprazole (PROTONIX) 40 MG tablet Take 1 tablet (40 mg total) by mouth daily. 90 tablet 3   rosuvastatin (CRESTOR) 20 MG tablet Take 1 tablet (20 mg total) by mouth daily. 90 tablet 3   spironolactone (ALDACTONE) 50 MG tablet Take 1 tablet (50 mg total) by mouth daily. 90 tablet 3   tiotropium (SPIRIVA) 18 MCG inhalation capsule Place 1 capsule (18 mcg total) into inhaler and inhale every morning. 90 capsule 3   triamcinolone (NASACORT) 55 MCG/ACT AERO nasal inhaler Place 1 spray into the nose daily. 1 Inhaler 11   No current facility-administered medications for this visit.     Past Medical History:  Diagnosis Date   ALLERGIC RHINITIS 03/17/2008   Anemia, iron deficiency    Blood transfusion complicating pregnancy    after first child at 16 yo   CAD (coronary artery disease)    Nonobstructive by cardiac catheter 8/12:EF 10-15%, circumflex calcified without obstructive CAD, proximal LAD 20%, mid LAD 20%, proximal RCA 20%, mid RCA 20%.   Cardiomyopathy    a. remote h/o cath with reported nonobs CAD; b. echo 5/12: EF 15%, mild LVH, mild MR, LAE, mod pericardial effusion with increased filling pressures   Cervical disc disease    with recurrent radicular pain, Dr. Montez Morita  COPD (chronic obstructive pulmonary disease) ([place]) 06/14/2010   DJD (degenerative joint disease)    hands   Genital warts    hx   GOITER, MULTINODULAR 12/23/2009   Hyperlipidemia 11/23/2015   Hypertension    Hypothyroid    Myocardial infarction ([place])    Systolic CHF [facility])     Past Surgical History:  Procedure Laterality Date   [place]   with "timy blockage" [facility] Dr. Sherald Barge   CARDIAC DEFIBRILLATOR PLACEMENT  01-22-11   by Dr.Taylor   CHOLECYSTECTOMY     EP IMPLANTABLE DEVICE N/A 01/24/2016   Procedure: [place];  Surgeon: Evans Lance, MD;  Location: [place] CV LAB;  Service:  Cardiovascular;  Laterality: N/A;   IMPLANTABLE CARDIOVERTER DEFIBRILLATOR IMPLANT N/A 01/22/2011   Procedure: IMPLANTABLE CARDIOVERTER DEFIBRILLATOR IMPLANT;  Surgeon: Evans Lance, MD;  Location: [facility] CATH LAB;  Service: Cardiovascular;  Laterality: N/A;    Social History   Socioeconomic History   Marital status: Single    Spouse name: Not on file   Number of children: 3   Years of education: Not on file   Highest education level: Not on file  Occupational History   Occupation: Retired    Fish farm manager: Psychologist, 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[employer]  Tobacco Use   Smoking status: Former    Packs/day: 0.00    Types: Cigarettes   Smokeless tobacco: Never  Vaping Use   Vaping Use: Never used  Substance and Sexual Activity   Alcohol use: No    Alcohol/week: 0.0 standard drinks of alcohol   Drug use: No   Sexual activity: Never  Other Topics Concern   Not on file  Social History Narrative   Not on file   Social Determinants of Health   Financial Resource Strain: Low Risk  (01/12/2020)   Overall Financial Resource Strain (CARDIA)    Difficulty of Paying Living Expenses: Not hard at all  Food Insecurity: No Food Insecurity (01/12/2020)   Hunger Vital Sign    Worried About Running Out of Food in the Last Year: Never true    [place] in the Last Year: Never true  Transportation Needs: No Transportation Needs (01/12/2020)   PRAPARE - Hydrologist (Medical): No    Lack of Transportation (Non-Medical): No  Physical Activity: Sufficiently Active (01/12/2020)   Exercise Vital Sign    Days of Exercise per Week: 5 days    Minutes of Exercise per Session: 30 min  Stress: No Stress Concern Present (01/12/2020)   [place]    Feeling of Stress : Not at all  Social Connections: [place] (01/12/2020)   Social Connection and Isolation Panel [NHANES]    Frequency of Communication with Friends and Family: More  than three times a week    Frequency of Social Gatherings with Friends and Family: More than three times a week    Attends Religious Services: More than 4 times per year    Active Member of [employer] or Organizations: Yes    Attends Music therapist: More than 4 times per year    Marital Status: Married  Human resources officer Violence: Not on file    Family History  Problem Relation Age of Onset   Stroke Mother    Heart disease Mother    Hypertension Mother    Diabetes Mother    Stroke Father    Heart disease Father    Hypertension  Father    Diabetes Father    Cancer Sister        colon   Heart disease Sister        pacemaker   Hypertension Sister    Depression Sister    Diabetes Sister    Thyroid disease Sister        uncertain type   Transient ischemic attack Maternal Grandfather    Alcohol abuse Other    Malignant hyperthermia Other     ROS: no fevers or chills, productive cough, hemoptysis, dysphasia, odynophagia, melena, hematochezia, dysuria, hematuria, rash, seizure activity, orthopnea, PND, pedal edema, claudication. Remaining systems are negative.  Physical Exam: Well-developed well-nourished in no acute distress.  Skin is warm and dry.  HEENT is normal.  Neck is supple.  Chest is clear to auscultation with normal expansion.  Cardiovascular exam is regular rate and rhythm.  Abdominal exam nontender or distended. No masses palpated. Extremities show no edema. neuro grossly intact  ECG- personally reviewed  A/P  1 nonischemic cardiomyopathy-LV function has improved on most recent echocardiogram.  We will continue ARB and beta-blocker at present dose.  Repeat echocardiogram.  2 coronary artery disease-patient denies chest pain.  Continue medical therapy with aspirin and statin.  3 hypertension-blood pressure controlled.  Continue present medical regimen.  4 chronic combined systolic/diastolic congestive heart failure-  5 hyperlipidemia-continue  statin.  6 biventricular ICD-Per EP.  Olga Millers, MD

## 2022-01-15 ENCOUNTER — Telehealth: Payer: Self-pay | Admitting: Cardiology

## 2022-01-15 NOTE — Telephone Encounter (Signed)
Patient requesting her appointment 11/8 be virtual due to having symptoms from her flu shot. She says she cannot walk.

## 2022-01-15 NOTE — Telephone Encounter (Signed)
Pt states that she does not need a callback from nurse. Pt rescheduled appt for 06/19/22. Please advise

## 2022-01-17 ENCOUNTER — Ambulatory Visit: Payer: Medicare Other | Admitting: Cardiology

## 2022-01-18 ENCOUNTER — Other Ambulatory Visit: Payer: Self-pay | Admitting: Internal Medicine

## 2022-01-18 NOTE — Telephone Encounter (Signed)
Please refill as per office routine med refill policy (all routine meds to be refilled for 3 mo or monthly (per pt preference) up to one year from last visit, then month to month grace period for 3 mo, then further med refills will have to be denied) ? ?

## 2022-01-24 DIAGNOSIS — M25512 Pain in left shoulder: Secondary | ICD-10-CM | POA: Diagnosis not present

## 2022-01-24 DIAGNOSIS — M5416 Radiculopathy, lumbar region: Secondary | ICD-10-CM | POA: Diagnosis not present

## 2022-01-24 DIAGNOSIS — M25562 Pain in left knee: Secondary | ICD-10-CM | POA: Diagnosis not present

## 2022-01-24 DIAGNOSIS — M545 Low back pain, unspecified: Secondary | ICD-10-CM | POA: Diagnosis not present

## 2022-02-21 DIAGNOSIS — M19012 Primary osteoarthritis, left shoulder: Secondary | ICD-10-CM | POA: Diagnosis not present

## 2022-02-21 DIAGNOSIS — M1712 Unilateral primary osteoarthritis, left knee: Secondary | ICD-10-CM | POA: Diagnosis not present

## 2022-02-21 DIAGNOSIS — M545 Low back pain, unspecified: Secondary | ICD-10-CM | POA: Diagnosis not present

## 2022-02-22 DIAGNOSIS — Z87891 Personal history of nicotine dependence: Secondary | ICD-10-CM | POA: Diagnosis not present

## 2022-02-22 DIAGNOSIS — Z809 Family history of malignant neoplasm, unspecified: Secondary | ICD-10-CM | POA: Diagnosis not present

## 2022-02-22 DIAGNOSIS — E785 Hyperlipidemia, unspecified: Secondary | ICD-10-CM | POA: Diagnosis not present

## 2022-02-22 DIAGNOSIS — E039 Hypothyroidism, unspecified: Secondary | ICD-10-CM | POA: Diagnosis not present

## 2022-02-22 DIAGNOSIS — M1990 Unspecified osteoarthritis, unspecified site: Secondary | ICD-10-CM | POA: Diagnosis not present

## 2022-02-22 DIAGNOSIS — J449 Chronic obstructive pulmonary disease, unspecified: Secondary | ICD-10-CM | POA: Diagnosis not present

## 2022-02-22 DIAGNOSIS — Z823 Family history of stroke: Secondary | ICD-10-CM | POA: Diagnosis not present

## 2022-02-22 DIAGNOSIS — M791 Myalgia, unspecified site: Secondary | ICD-10-CM | POA: Diagnosis not present

## 2022-02-22 DIAGNOSIS — Z008 Encounter for other general examination: Secondary | ICD-10-CM | POA: Diagnosis not present

## 2022-02-22 DIAGNOSIS — I739 Peripheral vascular disease, unspecified: Secondary | ICD-10-CM | POA: Diagnosis not present

## 2022-02-22 DIAGNOSIS — R609 Edema, unspecified: Secondary | ICD-10-CM | POA: Diagnosis not present

## 2022-02-22 DIAGNOSIS — Z8249 Family history of ischemic heart disease and other diseases of the circulatory system: Secondary | ICD-10-CM | POA: Diagnosis not present

## 2022-02-22 DIAGNOSIS — I10 Essential (primary) hypertension: Secondary | ICD-10-CM | POA: Diagnosis not present

## 2022-02-26 ENCOUNTER — Telehealth: Payer: Self-pay | Admitting: Internal Medicine

## 2022-02-26 NOTE — Telephone Encounter (Signed)
Left message for patient to call back to schedule Medicare Annual Wellness Visit   Last AWV  01/12/20  Please schedule at anytime with [facility]-Nurse Health Advisor if patient calls the office back.      Any questions, please call me at [phone]  

## 2022-03-08 ENCOUNTER — Telehealth: Payer: Self-pay | Admitting: Internal Medicine

## 2022-03-08 DIAGNOSIS — I739 Peripheral vascular disease, unspecified: Secondary | ICD-10-CM

## 2022-03-08 NOTE — Telephone Encounter (Signed)
Ok to contact pt   Home screening results sent to [place] indicate probable slow blood flow to the left leg  I will go ahead and order the formal testing to check the artery circulation, hopefully she will hear soon

## 2022-03-09 NOTE — Telephone Encounter (Signed)
Spoke with patient regarding results and recommended testing, no further questions at this time

## 2022-03-13 ENCOUNTER — Ambulatory Visit (HOSPITAL_COMMUNITY)
Admission: RE | Admit: 2022-03-13 | Payer: BC Managed Care – PPO | Source: Ambulatory Visit | Attending: Internal Medicine | Admitting: Internal Medicine

## 2022-03-14 DIAGNOSIS — Z008 Encounter for other general examination: Secondary | ICD-10-CM | POA: Diagnosis not present

## 2022-03-14 DIAGNOSIS — K219 Gastro-esophageal reflux disease without esophagitis: Secondary | ICD-10-CM | POA: Diagnosis not present

## 2022-03-14 DIAGNOSIS — I509 Heart failure, unspecified: Secondary | ICD-10-CM | POA: Diagnosis not present

## 2022-03-14 DIAGNOSIS — Z8249 Family history of ischemic heart disease and other diseases of the circulatory system: Secondary | ICD-10-CM | POA: Diagnosis not present

## 2022-03-14 DIAGNOSIS — I110 Hypertensive heart disease with heart failure: Secondary | ICD-10-CM | POA: Diagnosis not present

## 2022-03-14 DIAGNOSIS — Z87891 Personal history of nicotine dependence: Secondary | ICD-10-CM | POA: Diagnosis not present

## 2022-03-14 DIAGNOSIS — G8929 Other chronic pain: Secondary | ICD-10-CM | POA: Diagnosis not present

## 2022-03-14 DIAGNOSIS — M545 Low back pain, unspecified: Secondary | ICD-10-CM | POA: Diagnosis not present

## 2022-03-14 DIAGNOSIS — I739 Peripheral vascular disease, unspecified: Secondary | ICD-10-CM | POA: Diagnosis not present

## 2022-03-14 DIAGNOSIS — E669 Obesity, unspecified: Secondary | ICD-10-CM | POA: Diagnosis not present

## 2022-03-14 DIAGNOSIS — J449 Chronic obstructive pulmonary disease, unspecified: Secondary | ICD-10-CM | POA: Diagnosis not present

## 2022-03-14 DIAGNOSIS — E039 Hypothyroidism, unspecified: Secondary | ICD-10-CM | POA: Diagnosis not present

## 2022-03-14 DIAGNOSIS — E785 Hyperlipidemia, unspecified: Secondary | ICD-10-CM | POA: Diagnosis not present

## 2022-03-16 ENCOUNTER — Ambulatory Visit (HOSPITAL_COMMUNITY)
Admission: RE | Admit: 2022-03-16 | Discharge: 2022-03-16 | Disposition: A | Discharge status: Home / Self Care | Payer: Medicare HMO | Source: Ambulatory Visit | Attending: Internal Medicine | Admitting: Internal Medicine

## 2022-03-16 ENCOUNTER — Other Ambulatory Visit: Payer: Self-pay | Admitting: Internal Medicine

## 2022-03-16 DIAGNOSIS — I739 Peripheral vascular disease, unspecified: Secondary | ICD-10-CM | POA: Insufficient documentation

## 2022-03-23 ENCOUNTER — Ambulatory Visit: Payer: Medicare HMO | Admitting: Surgery

## 2022-03-23 ENCOUNTER — Encounter: Payer: Self-pay | Admitting: Surgery

## 2022-03-23 ENCOUNTER — Other Ambulatory Visit: Payer: Self-pay

## 2022-03-23 VITALS — BP 109/74 | HR 74 | Temp 98.0°F | Resp 20 | Ht 65.0 in | Wt 184.0 lb

## 2022-03-23 DIAGNOSIS — I70222 Atherosclerosis of native arteries of extremities with rest pain, left leg: Secondary | ICD-10-CM

## 2022-03-23 NOTE — H&P (View-Only) (Signed)
[facility] [place]  Patient name: [name] MRN: [id number] DOB: 02-12-1951 Sex: female   REQUESTING PROVIDER:    Dr. Jenny Reichmann   REASON FOR CONSULT:    PAD  HISTORY OF PRESENT ILLNESS:   [name] is a 72 y.o. female, who is referred for evaluation of lower extremity blood flow.  The patient states that she began having difficulty on October 2 after she got the flu shot.  She says that since that time her left foot has been cold and her left toe has become very painful.  It wakes her up at night.  She denies any claudication symptoms.  She does not have any open wounds.   The patient takes a statin for hypercholesterolemia.  She is medically managed for hypertension.  She is a former smoker with COPD.  She has a history of coronary artery disease, status post MI and cardiomyopathy and ICD PAST MEDICAL HISTORY    Past Medical History:  Diagnosis Date   ALLERGIC RHINITIS 03/17/2008   Anemia, iron deficiency    Blood transfusion complicating pregnancy    after first child at 32 yo   CAD (coronary artery disease)    Nonobstructive by cardiac catheter 8/12:EF 10-15%, circumflex calcified without obstructive CAD, proximal LAD 20%, mid LAD 20%, proximal RCA 20%, mid RCA 20%.   Cardiomyopathy    a. remote h/o cath with reported nonobs CAD; b. echo 5/12: EF 15%, mild LVH, mild MR, LAE, mod pericardial effusion with increased filling pressures   Cervical disc disease    with recurrent radicular pain, Dr. Eulas Post   COPD (chronic obstructive pulmonary disease) ([place]) 06/14/2010   DJD (degenerative joint disease)    hands   Genital warts    hx   GOITER, MULTINODULAR 12/23/2009   Hyperlipidemia 11/23/2015   Hypertension    Hypothyroid    Myocardial infarction ([place])    Systolic CHF ([place])      FAMILY HISTORY   Family History  Problem Relation Age of Onset   Stroke Mother    Heart disease Mother    Hypertension Mother     Diabetes Mother    Stroke Father    Heart disease Father    Hypertension Father    Diabetes Father    Cancer Sister        colon   Heart disease Sister        pacemaker   Hypertension Sister    Depression Sister    Diabetes Sister    Thyroid disease Sister        uncertain type   Transient ischemic attack Maternal Grandfather    Alcohol abuse Other    Malignant hyperthermia Other     SOCIAL HISTORY:   Social History   Socioeconomic History   Marital status: Single    Spouse name: Not on file   Number of children: 3   Years of education: Not on file   Highest education level: Not on file  Occupational History   Occupation: Retired    Fish farm manager: Psychologist, 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[employer]  Tobacco Use   Smoking status: Former    Packs/day: 0.00    Types: Cigarettes   Smokeless tobacco: Never  Vaping Use   Vaping Use: Never used  Substance and Sexual Activity   Alcohol use: No    Alcohol/week: 0.0 standard drinks of alcohol   Drug use: No   Sexual activity: Never  Other Topics Concern   Not on file  Social  History Narrative   Not on file   Social Determinants of Health   Financial Resource Strain: Low Risk  (01/12/2020)   Overall Financial Resource Strain (CARDIA)    Difficulty of Paying Living Expenses: Not hard at all  Food Insecurity: No Food Insecurity (01/12/2020)   Hunger Vital Sign    Worried About Running Out of Food in the Last Year: Never true    Ran Out of Food in the Last Year: Never true  Transportation Needs: No Transportation Needs (01/12/2020)   PRAPARE - Hydrologist (Medical): No    Lack of Transportation (Non-Medical): No  Physical Activity: Sufficiently Active (01/12/2020)   Exercise Vital Sign    Days of Exercise per Week: 5 days    Minutes of Exercise per Session: 30 min  Stress: No Stress Concern Present (01/12/2020)   [place]    Feeling of Stress : Not at all   Social Connections: [place] (01/12/2020)   Social Connection and Isolation Panel [NHANES]    Frequency of Communication with Friends and Family: More than three times a week    Frequency of Social Gatherings with Friends and Family: More than three times a week    Attends Religious Services: More than 4 times per year    Active Member of [employer] or Organizations: Yes    Attends Music therapist: More than 4 times per year    Marital Status: Married  Human resources officer Violence: Not on file    ALLERGIES:    Allergies  Allergen Reactions   Ivp Dye [Iodinated Contrast Media] Itching and Rash    rash, itching & peel (patient has a MILDER reaction when given w/benadryl)   Latex Rash    Allergic to latex gloves   Penicillins Itching, Swelling, Rash and Other (See Comments)    Has patient had a PCN reaction causing immediate rash, facial/tongue/throat swelling, SOB or lightheadedness with hypotension:Yes Has patient had a PCN reaction causing severe rash involving mucus membranes or skin necrosis:Yes Has patient had a PCN reaction that required hospitalization:Pt. Was inpatient when reaction occurred Has patient had a PCN reaction occurring within the last 10 years: Yes If all of the above answers are "NO", then may proceed with Cephalosporin use.    Strawberry Extract Swelling and Rash         CURRENT MEDICATIONS:    Current Outpatient Medications  Medication Sig Dispense Refill   albuterol (VENTOLIN HFA) 108 (90 Base) MCG/ACT inhaler Inhale 2 puffs into the lungs every 6 (six) hours as needed for wheezing or shortness of breath. 8 g 11   alum & mag hydroxide-simeth (MAALOX MAX) 400-400-40 MG/5ML suspension Take 10 mLs by mouth every 6 (six) hours as needed for indigestion. 355 mL 0   aspirin 81 MG EC tablet Take 81 mg by mouth daily.     carvedilol (COREG) 25 MG tablet Take 1 tablet (25 mg total) by mouth 2 (two) times daily with a meal. 180 tablet 3    Cholecalciferol 50 MCG (2000 UT) TABS 1 tab by mouth once daily 30 tablet 99   fexofenadine (ALLEGRA) 180 MG tablet Take 180 mg by mouth daily.     furosemide (LASIX) 40 MG tablet TAKE 1 TABLET BY MOUTH DAILY 90 tablet 3   hydrocortisone cream 1 % Apply 1 application topically 3 (three) times daily as needed for itching.     levothyroxine (SYNTHROID) 100 MCG tablet  Take 2 tablets (200 mcg total) by mouth daily. 180 tablet 3   levothyroxine (SYNTHROID) 75 MCG tablet Take 1 tablet (75 mcg total) by mouth daily. 90 tablet 3   lidocaine (XYLOCAINE) 2 % solution Use as directed 10 mLs in the mouth or throat every 6 (six) hours as needed (stomach pain). 100 mL 0   losartan (COZAAR) 50 MG tablet Take 1 tablet (50 mg total) by mouth daily. 90 tablet 3   methocarbamol (ROBAXIN) 500 MG tablet Take 2 tablets (1,000 mg total) by mouth at bedtime. 20 tablet 0   pantoprazole (PROTONIX) 40 MG tablet Take 1 tablet (40 mg total) by mouth daily. 90 tablet 3   rosuvastatin (CRESTOR) 20 MG tablet Take 1 tablet (20 mg total) by mouth daily. 90 tablet 3   spironolactone (ALDACTONE) 50 MG tablet Take 1 tablet (50 mg total) by mouth daily. 90 tablet 3   tiotropium (SPIRIVA) 18 MCG inhalation capsule Place 1 capsule (18 mcg total) into inhaler and inhale every morning. 90 capsule 3   triamcinolone (NASACORT) 55 MCG/ACT AERO nasal inhaler Place 1 spray into the nose daily. 1 Inhaler 11   No current facility-administered medications for this visit.    REVIEW OF SYSTEMS:   [X]  denotes positive finding, [ ]  denotes negative finding Cardiac  Comments:  Chest pain or chest pressure:    Shortness of breath upon exertion:    Short of breath when lying flat:    Irregular heart rhythm:        Vascular    Pain in calf, thigh, or hip brought on by ambulation:    Pain in feet at night that wakes you up from your sleep:  x   Blood clot in your veins:    Leg swelling:         Pulmonary    Oxygen at home:    Productive  cough:     Wheezing:         Neurologic    Sudden weakness in arms or legs:     Sudden numbness in arms or legs:     Sudden onset of difficulty speaking or slurred speech:    Temporary loss of vision in one eye:     Problems with dizziness:         Gastrointestinal    Blood in stool:      Vomited blood:         Genitourinary    Burning when urinating:     Blood in urine:        Psychiatric    Major depression:         Hematologic    Bleeding problems:    Problems with blood clotting too easily:        Skin    Rashes or ulcers:        Constitutional    Fever or chills:     PHYSICAL EXAM:   There were no vitals filed for this visit.  GENERAL: The patient is a well-nourished female, in no acute distress. The vital signs are documented above. CARDIAC: There is a regular rate and rhythm.  VASCULAR: I could not palpate pedal pulses PULMONARY: Nonlabored respirations MUSCULOSKELETAL: There are no major deformities or cyanosis. NEUROLOGIC: No focal weakness or paresthesias are detected. SKIN: There are no ulcers or rashes noted. PSYCHIATRIC: The patient has a normal affect.  STUDIES:   I have reviewed the following: ABI/TBIToday's ABIToday's TBIPrevious ABIPrevious TBI  +-------+-----------+-----------+------------+------------+  Right 0.88  0.61                                 +-------+-----------+-----------+------------+------------+  Left  0.54       0.07                                 +-------+-----------+-----------+------------+------------+     Right    Rt Pressure (mmHg)IndexWaveform   Comment   +---------+------------------+-----+-----------+--------+  Brachial 146                                         +---------+------------------+-----+-----------+--------+  CFA                            multiphasic          +---------+------------------+-----+-----------+--------+  Popliteal                       multiphasic          +---------+------------------+-----+-----------+--------+  PTA     133               0.88 multiphasic          +---------+------------------+-----+-----------+--------+  PERO    123               0.81 multiphasic          +---------+------------------+-----+-----------+--------+  DP      121               0.80 multiphasic          +---------+------------------+-----+-----------+--------+  Great Toe92                0.61 Abnormal             +---------+------------------+-----+-----------+--------+   ASSESSMENT and PLAN   PAD with left great toe pain: I discussed with the patient that I am not 100% convinced that her toe pain is coming from her circulation.  It could potentially be coming from her lower back issues.  However, she clearly has issues with blood flow to her left leg as her ABIs are 0.54 which indicates a likely superficial femoral artery occlusion versus stenosis.  She is also needing to have her nails clipped because of the fungus.  We discussed proceeding with angiography and intervention on the left leg via a right femoral approach to hopefully alleviate her left toe pain and also optimize her blood flow for toenail procedures.  This has been scheduled for Tuesday, January 23   Leia Alf, MD, FACS [facility] [phone] Pager [phone]

## 2022-03-23 NOTE — Progress Notes (Signed)
 [facility] [place]  Patient name: [name] MRN: [id number] DOB: 07/19/1950 Sex: female   REQUESTING PROVIDER:    Dr. John   REASON FOR CONSULT:    PAD  HISTORY OF PRESENT ILLNESS:   [name] is a 71 y.o. female, who is referred for evaluation of lower extremity blood flow.  The patient states that she began having difficulty on October 2 after she got the flu shot.  She says that since that time her left foot has been cold and her left toe has become very painful.  It wakes her up at night.  She denies any claudication symptoms.  She does not have any open wounds.   The patient takes a statin for hypercholesterolemia.  She is medically managed for hypertension.  She is a former smoker with COPD.  She has a history of coronary artery disease, status post MI and cardiomyopathy and ICD PAST MEDICAL HISTORY    Past Medical History:  Diagnosis Date   ALLERGIC RHINITIS 03/17/2008   Anemia, iron deficiency    Blood transfusion complicating pregnancy    after first child at 20 yo   CAD (coronary artery disease)    Nonobstructive by cardiac catheter 8/12:EF 10-15%, circumflex calcified without obstructive CAD, proximal LAD 20%, mid LAD 20%, proximal RCA 20%, mid RCA 20%.   Cardiomyopathy    a. remote h/o cath with reported nonobs CAD; b. echo 5/12: EF 15%, mild LVH, mild MR, LAE, mod pericardial effusion with increased filling pressures   Cervical disc disease    with recurrent radicular pain, Dr. Carter   COPD (chronic obstructive pulmonary disease) (HCC) 06/14/2010   DJD (degenerative joint disease)    hands   Genital warts    hx   GOITER, MULTINODULAR 12/23/2009   Hyperlipidemia 11/23/2015   Hypertension    Hypothyroid    Myocardial infarction (HCC)    Systolic CHF (HCC)      FAMILY HISTORY   Family History  Problem Relation Age of Onset   Stroke Mother    Heart disease Mother    Hypertension Mother     Diabetes Mother    Stroke Father    Heart disease Father    Hypertension Father    Diabetes Father    Cancer Sister        colon   Heart disease Sister        pacemaker   Hypertension Sister    Depression Sister    Diabetes Sister    Thyroid disease Sister        uncertain type   Transient ischemic attack Maternal Grandfather    Alcohol abuse Other    Malignant hyperthermia Other     SOCIAL HISTORY:   Social History   Socioeconomic History   Marital status: Single    Spouse name: Not on file   Number of children: 3   Years of education: Not on file   Highest education level: Not on file  Occupational History   Occupation: Retired    Employer: [employer]  Tobacco Use   Smoking status: Former    Packs/day: 0.00    Types: Cigarettes   Smokeless tobacco: Never  Vaping Use   Vaping Use: Never used  Substance and Sexual Activity   Alcohol use: No    Alcohol/week: 0.0 standard drinks of alcohol   Drug use: No   Sexual activity: Never  Other Topics Concern   Not on file  Social   History Narrative   Not on file   Social Determinants of Health   Financial Resource Strain: Low Risk  (01/12/2020)   Overall Financial Resource Strain (CARDIA)    Difficulty of Paying Living Expenses: Not hard at all  Food Insecurity: No Food Insecurity (01/12/2020)   Hunger Vital Sign    Worried About Running Out of Food in the Last Year: Never true    Ran Out of Food in the Last Year: Never true  Transportation Needs: No Transportation Needs (01/12/2020)   PRAPARE - Hydrologist (Medical): No    Lack of Transportation (Non-Medical): No  Physical Activity: Sufficiently Active (01/12/2020)   Exercise Vital Sign    Days of Exercise per Week: 5 days    Minutes of Exercise per Session: 30 min  Stress: No Stress Concern Present (01/12/2020)   [place]    Feeling of Stress : Not at all   Social Connections: [place] (01/12/2020)   Social Connection and Isolation Panel [NHANES]    Frequency of Communication with Friends and Family: More than three times a week    Frequency of Social Gatherings with Friends and Family: More than three times a week    Attends Religious Services: More than 4 times per year    Active Member of [employer] or Organizations: Yes    Attends Music therapist: More than 4 times per year    Marital Status: Married  Human resources officer Violence: Not on file    ALLERGIES:    Allergies  Allergen Reactions   Ivp Dye [Iodinated Contrast Media] Itching and Rash    rash, itching & peel (patient has a MILDER reaction when given w/benadryl)   Latex Rash    Allergic to latex gloves   Penicillins Itching, Swelling, Rash and Other (See Comments)    Has patient had a PCN reaction causing immediate rash, facial/tongue/throat swelling, SOB or lightheadedness with hypotension:Yes Has patient had a PCN reaction causing severe rash involving mucus membranes or skin necrosis:Yes Has patient had a PCN reaction that required hospitalization:Pt. Was inpatient when reaction occurred Has patient had a PCN reaction occurring within the last 10 years: Yes If all of the above answers are "NO", then may proceed with Cephalosporin use.    Strawberry Extract Swelling and Rash         CURRENT MEDICATIONS:    Current Outpatient Medications  Medication Sig Dispense Refill   albuterol (VENTOLIN HFA) 108 (90 Base) MCG/ACT inhaler Inhale 2 puffs into the lungs every 6 (six) hours as needed for wheezing or shortness of breath. 8 g 11   alum & mag hydroxide-simeth (MAALOX MAX) 400-400-40 MG/5ML suspension Take 10 mLs by mouth every 6 (six) hours as needed for indigestion. 355 mL 0   aspirin 81 MG EC tablet Take 81 mg by mouth daily.     carvedilol (COREG) 25 MG tablet Take 1 tablet (25 mg total) by mouth 2 (two) times daily with a meal. 180 tablet 3    Cholecalciferol 50 MCG (2000 UT) TABS 1 tab by mouth once daily 30 tablet 99   fexofenadine (ALLEGRA) 180 MG tablet Take 180 mg by mouth daily.     furosemide (LASIX) 40 MG tablet TAKE 1 TABLET BY MOUTH DAILY 90 tablet 3   hydrocortisone cream 1 % Apply 1 application topically 3 (three) times daily as needed for itching.     levothyroxine (SYNTHROID) 100 MCG tablet  Take 2 tablets (200 mcg total) by mouth daily. 180 tablet 3   levothyroxine (SYNTHROID) 75 MCG tablet Take 1 tablet (75 mcg total) by mouth daily. 90 tablet 3   lidocaine (XYLOCAINE) 2 % solution Use as directed 10 mLs in the mouth or throat every 6 (six) hours as needed (stomach pain). 100 mL 0   losartan (COZAAR) 50 MG tablet Take 1 tablet (50 mg total) by mouth daily. 90 tablet 3   methocarbamol (ROBAXIN) 500 MG tablet Take 2 tablets (1,000 mg total) by mouth at bedtime. 20 tablet 0   pantoprazole (PROTONIX) 40 MG tablet Take 1 tablet (40 mg total) by mouth daily. 90 tablet 3   rosuvastatin (CRESTOR) 20 MG tablet Take 1 tablet (20 mg total) by mouth daily. 90 tablet 3   spironolactone (ALDACTONE) 50 MG tablet Take 1 tablet (50 mg total) by mouth daily. 90 tablet 3   tiotropium (SPIRIVA) 18 MCG inhalation capsule Place 1 capsule (18 mcg total) into inhaler and inhale every morning. 90 capsule 3   triamcinolone (NASACORT) 55 MCG/ACT AERO nasal inhaler Place 1 spray into the nose daily. 1 Inhaler 11   No current facility-administered medications for this visit.    REVIEW OF SYSTEMS:   [X]  denotes positive finding, [ ]  denotes negative finding Cardiac  Comments:  Chest pain or chest pressure:    Shortness of breath upon exertion:    Short of breath when lying flat:    Irregular heart rhythm:        Vascular    Pain in calf, thigh, or hip brought on by ambulation:    Pain in feet at night that wakes you up from your sleep:  x   Blood clot in your veins:    Leg swelling:         Pulmonary    Oxygen at home:    Productive  cough:     Wheezing:         Neurologic    Sudden weakness in arms or legs:     Sudden numbness in arms or legs:     Sudden onset of difficulty speaking or slurred speech:    Temporary loss of vision in one eye:     Problems with dizziness:         Gastrointestinal    Blood in stool:      Vomited blood:         Genitourinary    Burning when urinating:     Blood in urine:        Psychiatric    Major depression:         Hematologic    Bleeding problems:    Problems with blood clotting too easily:        Skin    Rashes or ulcers:        Constitutional    Fever or chills:     PHYSICAL EXAM:   There were no vitals filed for this visit.  GENERAL: The patient is a well-nourished female, in no acute distress. The vital signs are documented above. CARDIAC: There is a regular rate and rhythm.  VASCULAR: I could not palpate pedal pulses PULMONARY: Nonlabored respirations MUSCULOSKELETAL: There are no major deformities or cyanosis. NEUROLOGIC: No focal weakness or paresthesias are detected. SKIN: There are no ulcers or rashes noted. PSYCHIATRIC: The patient has a normal affect.  STUDIES:   I have reviewed the following: ABI/TBIToday's ABIToday's TBIPrevious ABIPrevious TBI  +-------+-----------+-----------+------------+------------+  Right 0.88  0.61                                 +-------+-----------+-----------+------------+------------+  Left  0.54       0.07                                 +-------+-----------+-----------+------------+------------+     Right    Rt Pressure (mmHg)IndexWaveform   Comment   +---------+------------------+-----+-----------+--------+  Brachial 146                                         +---------+------------------+-----+-----------+--------+  CFA                            multiphasic          +---------+------------------+-----+-----------+--------+  Popliteal                       multiphasic          +---------+------------------+-----+-----------+--------+  PTA     133               0.88 multiphasic          +---------+------------------+-----+-----------+--------+  PERO    123               0.81 multiphasic          +---------+------------------+-----+-----------+--------+  DP      121               0.80 multiphasic          +---------+------------------+-----+-----------+--------+  Great Toe92                0.61 Abnormal             +---------+------------------+-----+-----------+--------+   ASSESSMENT and PLAN   PAD with left great toe pain: I discussed with the patient that I am not 100% convinced that her toe pain is coming from her circulation.  It could potentially be coming from her lower back issues.  However, she clearly has issues with blood flow to her left leg as her ABIs are 0.54 which indicates a likely superficial femoral artery occlusion versus stenosis.  She is also needing to have her nails clipped because of the fungus.  We discussed proceeding with angiography and intervention on the left leg via a right femoral approach to hopefully alleviate her left toe pain and also optimize her blood flow for toenail procedures.  This has been scheduled for Tuesday, January 23   Leia Alf, MD, FACS [facility] [phone] Pager [phone]

## 2022-04-03 ENCOUNTER — Encounter (HOSPITAL_COMMUNITY): Payer: Self-pay | Admitting: Surgery

## 2022-04-03 ENCOUNTER — Encounter (HOSPITAL_COMMUNITY)
Admission: RE | Disposition: A | Discharge status: Home / Self Care | Payer: Self-pay | Source: Ambulatory Visit | Attending: Surgery

## 2022-04-03 ENCOUNTER — Ambulatory Visit (HOSPITAL_COMMUNITY)
Admission: RE | Admit: 2022-04-03 | Discharge: 2022-04-03 | Disposition: A | Discharge status: Home / Self Care | Payer: Medicare HMO | Source: Ambulatory Visit | Attending: Surgery | Admitting: Surgery

## 2022-04-03 ENCOUNTER — Other Ambulatory Visit: Payer: Self-pay

## 2022-04-03 DIAGNOSIS — I428 Other cardiomyopathies: Secondary | ICD-10-CM | POA: Diagnosis not present

## 2022-04-03 DIAGNOSIS — I2510 Atherosclerotic heart disease of native coronary artery without angina pectoris: Secondary | ICD-10-CM | POA: Diagnosis not present

## 2022-04-03 DIAGNOSIS — Z87891 Personal history of nicotine dependence: Secondary | ICD-10-CM | POA: Diagnosis not present

## 2022-04-03 DIAGNOSIS — I5022 Chronic systolic (congestive) heart failure: Secondary | ICD-10-CM | POA: Diagnosis not present

## 2022-04-03 DIAGNOSIS — I70222 Atherosclerosis of native arteries of extremities with rest pain, left leg: Secondary | ICD-10-CM | POA: Diagnosis not present

## 2022-04-03 DIAGNOSIS — Z95810 Presence of automatic (implantable) cardiac defibrillator: Secondary | ICD-10-CM | POA: Diagnosis not present

## 2022-04-03 DIAGNOSIS — J449 Chronic obstructive pulmonary disease, unspecified: Secondary | ICD-10-CM | POA: Insufficient documentation

## 2022-04-03 DIAGNOSIS — I252 Old myocardial infarction: Secondary | ICD-10-CM | POA: Insufficient documentation

## 2022-04-03 DIAGNOSIS — M79675 Pain in left toe(s): Secondary | ICD-10-CM | POA: Insufficient documentation

## 2022-04-03 DIAGNOSIS — E7800 Pure hypercholesterolemia, unspecified: Secondary | ICD-10-CM | POA: Diagnosis not present

## 2022-04-03 DIAGNOSIS — I110 Hypertensive heart disease with heart failure: Secondary | ICD-10-CM | POA: Diagnosis not present

## 2022-04-03 HISTORY — PX: ABDOMINAL AORTOGRAM W/LOWER EXTREMITY: CATH118223

## 2022-04-03 HISTORY — PX: PERIPHERAL VASCULAR BALLOON ANGIOPLASTY: CATH118281

## 2022-04-03 LAB — POCT I-STAT, CHEM 8
BUN: 16 mg/dL (ref 8–23)
Calcium, Ion: 1.24 mmol/L (ref 1.15–1.40)
Chloride: 105 mmol/L (ref 98–111)
Creatinine, Ser: 1.3 mg/dL — ABNORMAL HIGH (ref 0.44–1.00)
Glucose, Bld: 109 mg/dL — ABNORMAL HIGH (ref 70–99)
HCT: 37 % (ref 36.0–46.0)
Hemoglobin: 12.6 g/dL (ref 12.0–15.0)
Potassium: 3.8 mmol/L (ref 3.5–5.1)
Sodium: 141 mmol/L (ref 135–145)
TCO2: 26 mmol/L (ref 22–32)

## 2022-04-03 LAB — POCT ACTIVATED CLOTTING TIME: Activated Clotting Time: 266 seconds

## 2022-04-03 SURGERY — ABDOMINAL AORTOGRAM W/LOWER EXTREMITY
Anesthesia: LOCAL

## 2022-04-03 MED ORDER — METHYLPREDNISOLONE SODIUM SUCC 125 MG IJ SOLR
125.0000 mg | INTRAMUSCULAR | Status: AC
  Administered 2022-04-03: 125 mg via INTRAVENOUS
  Filled 2022-04-03: qty 2

## 2022-04-03 MED ORDER — SODIUM CHLORIDE 0.9% FLUSH: 3.0000 mL | Freq: Two times a day (BID) | INTRAVENOUS | Status: DC

## 2022-04-03 MED ORDER — ASPIRIN 81 MG PO TBEC: 81.0000 mg | DELAYED_RELEASE_TABLET | Freq: Every day | ORAL | Status: DC

## 2022-04-03 MED ORDER — ONDANSETRON HCL 4 MG/2ML IJ SOLN: 4.0000 mg | Freq: Four times a day (QID) | INTRAMUSCULAR | Status: DC | PRN

## 2022-04-03 MED ORDER — SODIUM CHLORIDE 0.9 % WEIGHT BASED INFUSION: 1.0000 mL/kg/h | INTRAVENOUS | Status: DC

## 2022-04-03 MED ORDER — LABETALOL HCL 5 MG/ML IV SOLN: 10.0000 mg | INTRAVENOUS | Status: DC | PRN

## 2022-04-03 MED ORDER — LIDOCAINE HCL (PF) 1 % IJ SOLN
INTRAMUSCULAR | Status: DC | PRN
  Administered 2022-04-03: 15 mL

## 2022-04-03 MED ORDER — CLOPIDOGREL BISULFATE 75 MG PO TABS: 75.0000 mg | ORAL_TABLET | Freq: Every day | ORAL | Status: DC

## 2022-04-03 MED ORDER — HEPARIN SODIUM (PORCINE) 1000 UNIT/ML IJ SOLN
INTRAMUSCULAR | Status: AC
  Filled 2022-04-03: qty 10

## 2022-04-03 MED ORDER — CLOPIDOGREL BISULFATE 300 MG PO TABS
ORAL_TABLET | ORAL | Status: DC | PRN
  Administered 2022-04-03: 300 mg via ORAL

## 2022-04-03 MED ORDER — HEPARIN (PORCINE) IN NACL 1000-0.9 UT/500ML-% IV SOLN
INTRAVENOUS | Status: DC | PRN
  Administered 2022-04-03 (×2): 500 mL

## 2022-04-03 MED ORDER — MORPHINE SULFATE (PF) 2 MG/ML IV SOLN: 2.0000 mg | INTRAVENOUS | Status: DC | PRN

## 2022-04-03 MED ORDER — FENTANYL CITRATE (PF) 100 MCG/2ML IJ SOLN
INTRAMUSCULAR | Status: AC
  Filled 2022-04-03: qty 2

## 2022-04-03 MED ORDER — FENTANYL CITRATE (PF) 100 MCG/2ML IJ SOLN
INTRAMUSCULAR | Status: DC | PRN
  Administered 2022-04-03: 50 ug via INTRAVENOUS

## 2022-04-03 MED ORDER — NITROGLYCERIN 1 MG/10 ML FOR IR/CATH LAB
INTRA_ARTERIAL | Status: AC
  Filled 2022-04-03: qty 10

## 2022-04-03 MED ORDER — NITROGLYCERIN 1 MG/10 ML FOR IR/CATH LAB
INTRA_ARTERIAL | Status: DC | PRN
  Administered 2022-04-03: 400 mL via INTRA_ARTERIAL
  Administered 2022-04-03: 200 ug via INTRA_ARTERIAL

## 2022-04-03 MED ORDER — CLOPIDOGREL BISULFATE 75 MG PO TABS: 75.0000 mg | ORAL_TABLET | Freq: Every day | ORAL | 11 refills | Status: DC

## 2022-04-03 MED ORDER — DIPHENHYDRAMINE HCL 50 MG/ML IJ SOLN
25.0000 mg | INTRAMUSCULAR | Status: AC
  Administered 2022-04-03: 25 mg via INTRAVENOUS
  Filled 2022-04-03: qty 1

## 2022-04-03 MED ORDER — CLOPIDOGREL BISULFATE 300 MG PO TABS
ORAL_TABLET | ORAL | Status: AC
  Filled 2022-04-03: qty 1

## 2022-04-03 MED ORDER — OXYCODONE HCL 5 MG PO TABS: 5.0000 mg | ORAL_TABLET | ORAL | Status: DC | PRN

## 2022-04-03 MED ORDER — CLOPIDOGREL BISULFATE 75 MG PO TABS: 300.0000 mg | ORAL_TABLET | Freq: Once | ORAL | Status: DC

## 2022-04-03 MED ORDER — SODIUM CHLORIDE 0.9 % IV SOLN: INTRAVENOUS | Status: DC

## 2022-04-03 MED ORDER — LIDOCAINE HCL (PF) 1 % IJ SOLN
INTRAMUSCULAR | Status: AC
  Filled 2022-04-03: qty 30

## 2022-04-03 MED ORDER — HYDRALAZINE HCL 20 MG/ML IJ SOLN: 5.0000 mg | INTRAMUSCULAR | Status: DC | PRN

## 2022-04-03 MED ORDER — HEPARIN SODIUM (PORCINE) 1000 UNIT/ML IJ SOLN
INTRAMUSCULAR | Status: DC | PRN
  Administered 2022-04-03: 8500 [IU] via INTRAVENOUS

## 2022-04-03 MED ORDER — ACETAMINOPHEN 325 MG PO TABS: 650.0000 mg | ORAL_TABLET | ORAL | Status: DC | PRN

## 2022-04-03 MED ORDER — HEPARIN (PORCINE) IN NACL 1000-0.9 UT/500ML-% IV SOLN
INTRAVENOUS | Status: AC
  Filled 2022-04-03: qty 1000

## 2022-04-03 MED ORDER — MIDAZOLAM HCL 2 MG/2ML IJ SOLN
INTRAMUSCULAR | Status: DC | PRN
  Administered 2022-04-03: 2 mg via INTRAVENOUS

## 2022-04-03 MED ORDER — SODIUM CHLORIDE 0.9% FLUSH: 3.0000 mL | INTRAVENOUS | Status: DC | PRN

## 2022-04-03 MED ORDER — MIDAZOLAM HCL 2 MG/2ML IJ SOLN
INTRAMUSCULAR | Status: AC
  Filled 2022-04-03: qty 2

## 2022-04-03 MED ORDER — SODIUM CHLORIDE 0.9 % IV SOLN: 250.0000 mL | INTRAVENOUS | Status: DC | PRN

## 2022-04-03 MED ORDER — IODIXANOL 320 MG/ML IV SOLN
INTRAVENOUS | Status: DC | PRN
  Administered 2022-04-03: 160 mL

## 2022-04-03 SURGICAL SUPPLY — 20 items
BALLN STERLING OTW 3X100X150 (BALLOONS) ×2
BALLOON STERLING OTW 3X100X150 (BALLOONS) IMPLANT
CATH ANGIO 5F BER2 100CM (CATHETERS) IMPLANT
CATH OMNI FLUSH 5F 65CM (CATHETERS) IMPLANT
DCB RANGER 4.0X40 135 (BALLOONS) IMPLANT
DEVICE VASC CLSR CELT ART 6 (Vascular Products) IMPLANT
KIT ENCORE 26 ADVANTAGE (KITS) IMPLANT
KIT MICROPUNCTURE NIT STIFF (SHEATH) IMPLANT
KIT PV (KITS) ×3 IMPLANT
RANGER DCB 4.0X40 135 (BALLOONS) ×2
SHEATH CATAPULT 6FR 60 (SHEATH) IMPLANT
SHEATH PINNACLE 5F 10CM (SHEATH) IMPLANT
SHEATH PINNACLE 6F 10CM (SHEATH) IMPLANT
SHEATH PROBE COVER 6X72 (BAG) IMPLANT
SYR MEDRAD MARK V 150ML (SYRINGE) IMPLANT
TRANSDUCER W/STOPCOCK (MISCELLANEOUS) ×3 IMPLANT
TRAY PV CATH (CUSTOM PROCEDURE TRAY) ×3 IMPLANT
WIRE BENTSON .035X145CM (WIRE) IMPLANT
WIRE G V18X300CM (WIRE) IMPLANT
WIRE ROSEN-J .035X180CM (WIRE) IMPLANT

## 2022-04-03 NOTE — Progress Notes (Signed)
Up and walked and tolerated well; right groin stable, no bleeding or hematoma 

## 2022-04-03 NOTE — Interval H&P Note (Signed)
History and Physical Interval Note:  04/03/2022 7:42 AM  [name]  has presented today for surgery, with the diagnosis of rest pain.  The various methods of treatment have been discussed with the patient and family. After consideration of risks, benefits and other options for treatment, the patient has consented to  Procedure(s): ABDOMINAL AORTOGRAM W/LOWER EXTREMITY (N/A) as a surgical intervention.  The patient's history has been reviewed, patient examined, no change in status, stable for surgery.  I have reviewed the patient's chart and labs.  Questions were answered to the patient's satisfaction.     Annamarie Major

## 2022-04-03 NOTE — Op Note (Signed)
Patient name: [name] MRN: [id number] DOB: 08-04-1950 Sex: female  04/03/2022 Pre-operative Diagnosis: Left leg rest pain Post-operative diagnosis:  Same Surgeon:  Annamarie Major Procedure Performed:  1.  Ultrasound-guided access, right femoral artery  2.  Abdominal aortogram  3.  Bilateral lower extremity runoff  4.  Angioplasty, left peroneal artery  5.  Drug-coated balloon angioplasty, left popliteal artery  6.  Conscious sedation, 66 minutes  7.  Closure device, Celt  8.  Intra-arterial administration of nitroglycerin   Indications: This is a 72 year old female with rest pain in her left great toe and abnormal ABIs.  She is here for revascularization.  Procedure:  The patient was identified in the holding area and taken to room 8.  The patient was then placed supine on the table and prepped and draped in the usual sterile fashion.  A time out was called.  Conscious sedation was administered with the use of IV fentanyl and Versed under continuous physician and nurse monitoring.  Heart rate, blood pressure, and oxygen saturation were continuously monitored.  Total sedation time was 66 minutes.  Ultrasound was used to evaluate the right common femoral artery.  It was patent .  A digital ultrasound image was acquired.  A micropuncture needle was used to access the right common femoral artery under ultrasound guidance.  An 018 wire was advanced without resistance and a micropuncture sheath was placed.  The 018 wire was removed and a benson wire was placed.  The micropuncture sheath was exchanged for a 5 french sheath.  An omniflush catheter was advanced over the wire to the level of L-1.  An abdominal angiogram was obtained.  Next, using the omniflush catheter and a benson wire, the aortic bifurcation was crossed and the catheter was placed into theleft external iliac artery and left runoff was obtained.  right runoff was performed via retrograde sheath injections.  Findings:    Aortogram: No significant renal artery stenosis was identified.  The infrarenal abdominal aorta is widely patent.  Bilateral common and external iliac arteries are widely patent.  Right Lower Extremity: The right common femoral and profundofemoral artery are widely patent.  The superficial femoral arteries patent throughout its course however it is diffusely diseased with several mild to moderate areas of narrowing.  There is single-vessel runoff via the peroneal artery  Left Lower Extremity: The left common femoral profundofemoral artery are widely patent.  There is diffuse disease throughout the left superficial femoral artery with approximate 50-60% stenosis at the adductor canal.  The popliteal artery below the knee is occluded with reconstitution of what I believed to be the peroneal artery, however this was not definitively determined as it could be the anterior tibial  Intervention: After the above images were acquired the decision was made to proceed with intervention.  A 6 French catapulted sheath was advanced into the left superficial femoral artery.  The patient was fully heparinized.  Next using a V-18 wire and a Berenstein 2 catheter, I was able to navigate through the occlusion and get a wire into the anterior tibial/peroneal artery.  A contrast injection was performed through a 3 x 100 Sterling balloon confirming successful reentry.  I then performed balloon angioplasty of the peroneal/anterior tibial and popliteal artery.  Completion imaging showed a good result however there was an area of narrowing just distal to the balloon which I felt was spasm and so I repeated balloon angioplasty for 2 minutes.  I then treated the popliteal artery below  the knee with a 4 x 40 drug-coated Ranger balloon.  I then advanced a Berenstein 2 catheter back into the below-knee popliteal artery and administered 600 mcg of intra-arterial nitroglycerin.  Completion imaging showed complete resolution of the stenosis  with inline single-vessel flow to the foot.  The groin was closed with a Celt  Impression:  #1  Diffuse bilateral superficial femoral artery stenosis  #2  50 to 60% focal lesion in the left superficial femoral artery at the adductor canal which I elected to not treat at this time  #3  Occlusion of the left below-knee popliteal artery with reconstitution of what could be either the anterior tibial or peroneal artery.  I performed drug-coated balloon angioplasty of the popliteal artery with a 4 mm balloon and plain balloon angioplasty of the tibial vessel with a 3 mm balloon.  She has inline flow to the foot.  There is diffuse disease out onto the foot    V. Annamarie Major, M.D., [facility] [place] Office: [phone] Pager:  [phone]

## 2022-04-04 ENCOUNTER — Encounter (HOSPITAL_COMMUNITY): Payer: Self-pay | Admitting: Surgery

## 2022-04-06 ENCOUNTER — Telehealth: Payer: Self-pay | Admitting: Surgery

## 2022-04-06 NOTE — Telephone Encounter (Signed)
-----  Message from Serafina Mitchell, MD sent at 04/03/2022  9:19 AM EST ----- 04/03/2022:  Surgeon:  Annamarie Major Procedure Performed:  1.  Ultrasound-guided access, right femoral artery  2.  Abdominal aortogram  3.  Bilateral lower extremity runoff  4.  Angioplasty, left peroneal artery  5.  Drug-coated balloon angioplasty, left popliteal artery  6.  Conscious sedation, 66 minutes  7.  Closure device, Celt  8.  Intra-arterial administration of nitroglycerin  Follow-up with me in 1 month with left leg arterial duplex and ABIs

## 2022-04-09 ENCOUNTER — Telehealth: Payer: Self-pay | Admitting: Internal Medicine

## 2022-04-09 NOTE — Telephone Encounter (Signed)
Patient had surgery 04/03/22. IP dye was used and  patient is having allergic reaction. Sxs is very itchy, swollen, redness, and has rash.  Patient is asking for medication to help with above sxs.

## 2022-04-10 NOTE — Telephone Encounter (Signed)
Unable to reach patient and voicemail not set up at the moment

## 2022-04-10 NOTE — Telephone Encounter (Signed)
Oh yes, no problem with benadryl and plavix - thanks

## 2022-04-10 NOTE — Telephone Encounter (Signed)
Patient states she was prescribed Plavix after surgery and was informed not to take any other meds with it. Is it ok to take Benadryl with Plavix, follow up scheduled with you 1/31

## 2022-04-10 NOTE — Telephone Encounter (Signed)
Ok to take OTC Benadryl 50 mg every 6 hours as needed,   Needs ROV any provider

## 2022-04-11 ENCOUNTER — Ambulatory Visit (INDEPENDENT_AMBULATORY_CARE_PROVIDER_SITE_OTHER): Payer: Medicare HMO | Admitting: Internal Medicine

## 2022-04-11 ENCOUNTER — Encounter: Payer: Self-pay | Admitting: Internal Medicine

## 2022-04-11 VITALS — BP 144/66 | HR 83 | Temp 98.0°F | Ht 65.0 in | Wt 185.2 lb

## 2022-04-11 DIAGNOSIS — N1831 Chronic kidney disease, stage 3a: Secondary | ICD-10-CM | POA: Diagnosis not present

## 2022-04-11 DIAGNOSIS — R21 Rash and other nonspecific skin eruption: Secondary | ICD-10-CM | POA: Insufficient documentation

## 2022-04-11 DIAGNOSIS — I10 Essential (primary) hypertension: Secondary | ICD-10-CM

## 2022-04-11 DIAGNOSIS — E039 Hypothyroidism, unspecified: Secondary | ICD-10-CM | POA: Diagnosis not present

## 2022-04-11 MED ORDER — ALBUTEROL SULFATE HFA 108 (90 BASE) MCG/ACT IN AERS: 2.0000 | INHALATION_SPRAY | Freq: Four times a day (QID) | RESPIRATORY_TRACT | 11 refills | Status: AC | PRN

## 2022-04-11 MED ORDER — LEVOTHYROXINE SODIUM 75 MCG PO TABS: 75.0000 ug | ORAL_TABLET | Freq: Every day | ORAL | 3 refills | Status: DC

## 2022-04-11 MED ORDER — METHYLPREDNISOLONE ACETATE 80 MG/ML IJ SUSP
80.0000 mg | Freq: Once | INTRAMUSCULAR | Status: AC
  Administered 2022-04-11: 80 mg via INTRAMUSCULAR

## 2022-04-11 MED ORDER — PREDNISONE 10 MG PO TABS: ORAL_TABLET | ORAL | 0 refills | Status: AC

## 2022-04-11 MED ORDER — TIOTROPIUM BROMIDE MONOHYDRATE 18 MCG IN CAPS: 18.0000 ug | ORAL_CAPSULE | RESPIRATORY_TRACT | 3 refills | Status: AC

## 2022-04-11 MED ORDER — LEVOTHYROXINE SODIUM 100 MCG PO TABS: 200.0000 ug | ORAL_TABLET | Freq: Every day | ORAL | 3 refills | Status: DC

## 2022-04-11 NOTE — Assessment & Plan Note (Signed)
BP Readings from Last 3 Encounters:  04/11/22 (!) 144/66  04/03/22 133/63  03/23/22 109/74   Mild uncontrolled, likely situational, pt to continue medical treatment coreg 25 bid, losartan 50 qd

## 2022-04-11 NOTE — Progress Notes (Signed)
Patient ID: [name], female   DOB: Jan 27, 1951, 72 y.o.   MRN: [id number]        Chief Complaint: follow up persistent rash, itching and swelling, low thyroid, htnm [id number]       HPI:  [name] is a 72 y.o. female here with c/o episode of CT 1 wk ago with dye, now with persistent rash, itching to torso and extremities with angioedema of the thighs, without fever, overall some improved since yesterday with otc 50 mg benadryl.  Pt denies chest pain, increased sob or doe, wheezing, orthopnea, PND, increased LE swelling, palpitations, dizziness or syncope.   Pt denies polydipsia, polyuria, or new focal neuro s/s.    Pt denies fever, wt loss, night sweats, loss of appetite, or other constitutional symptoms         Wt Readings from Last 3 Encounters:  04/11/22 185 lb 4 oz (84 kg)  03/23/22 184 lb (83.5 kg)  12/14/21 181 lb (82.1 kg)   BP Readings from Last 3 Encounters:  04/11/22 (!) 144/66  04/03/22 133/63  03/23/22 109/74         Past Medical History:  Diagnosis Date   ALLERGIC RHINITIS 03/17/2008   Anemia, iron deficiency    Blood transfusion complicating pregnancy    after first child at 35 yo   CAD (coronary artery disease)    Nonobstructive by cardiac catheter 8/12:EF 10-15%, circumflex calcified without obstructive CAD, proximal LAD 20%, mid LAD 20%, proximal RCA 20%, mid RCA 20%.   Cardiomyopathy    a. remote h/o cath with reported nonobs CAD; b. echo 5/12: EF 15%, mild LVH, mild MR, LAE, mod pericardial effusion with increased filling pressures   Cervical disc disease    with recurrent radicular pain, Dr. Eulas Post   COPD (chronic obstructive pulmonary disease) ([place]) 06/14/2010   DJD (degenerative joint disease)    hands   Genital warts    hx   GOITER, MULTINODULAR 12/23/2009   Hyperlipidemia 11/23/2015   Hypertension    Hypothyroid    Myocardial infarction [facility])    Systolic CHF [facility])    Past Surgical History:  Procedure Laterality Date   ABDOMINAL AORTOGRAM W/LOWER  EXTREMITY N/A 04/03/2022   Procedure: ABDOMINAL AORTOGRAM W/LOWER EXTREMITY;  Surgeon: Serafina Mitchell, MD;  Location: [place] [facility];  Service: Cardiovascular;  Laterality: N/A;   CARDIAC CATHETERIZATION  1995   with "timy blockage" [facility] Dr. Sherald Barge   CARDIAC DEFIBRILLATOR PLACEMENT  01-22-11   by Dr.Taylor   CHOLECYSTECTOMY     EP IMPLANTABLE DEVICE N/A 01/24/2016   Procedure: ICD Generator Changeout;  Surgeon: Evans Lance, MD;  Location: [place] [facility];  Service: Cardiovascular;  Laterality: N/A;   IMPLANTABLE CARDIOVERTER DEFIBRILLATOR IMPLANT N/A 01/22/2011   Procedure: IMPLANTABLE CARDIOVERTER DEFIBRILLATOR IMPLANT;  Surgeon: Evans Lance, MD;  Location: [facility] CATH LAB;  Service: Cardiovascular;  Laterality: N/A;   PERIPHERAL VASCULAR BALLOON ANGIOPLASTY Left 04/03/2022   Procedure: PERIPHERAL VASCULAR BALLOON ANGIOPLASTY;  Surgeon: Serafina Mitchell, MD;  Location: [place] [facility] LAB;  Service: Cardiovascular;  Laterality: Left;  pop    reports that she has quit smoking. Her smoking use included cigarettes. She has never used smokeless tobacco. She reports that she does not drink alcohol and does not use drugs. family history includes Alcohol abuse in an other family member; Cancer in her sister; Depression in her sister; Diabetes in her father, mother, and sister; Heart disease in her father, mother, and sister; Hypertension  in her father, mother, and sister; Malignant hyperthermia in an other family member; Stroke in her father and mother; Thyroid disease in her sister; Transient ischemic attack in her maternal grandfather. Allergies  Allergen Reactions   Ivp Dye [Iodinated Contrast Media] Itching and Rash    rash, itching & peel (patient has a MILDER reaction when given w/benadryl)   Latex Rash    Allergic to latex gloves   Penicillins Itching, Swelling, Rash and Other (See Comments)    Has patient had a PCN reaction causing immediate rash, facial/tongue/throat  swelling, SOB or lightheadedness with hypotension:Yes Has patient had a PCN reaction causing severe rash involving mucus membranes or skin necrosis:Yes Has patient had a PCN reaction that required hospitalization:Pt. Was inpatient when reaction occurred Has patient had a PCN reaction occurring within the last 10 years: Yes If all of the above answers are "NO", then may proceed with Cephalosporin use.    Strawberry Extract Swelling and Rash        Current Outpatient Medications on File Prior to Visit  Medication Sig Dispense Refill   alum & mag hydroxide-simeth (MAALOX MAX) 400-400-40 MG/5ML suspension Take 10 mLs by mouth every 6 (six) hours as needed for indigestion. 355 mL 0   aspirin 81 MG EC tablet Take 81 mg by mouth daily.     carvedilol (COREG) 25 MG tablet Take 1 tablet (25 mg total) by mouth 2 (two) times daily with a meal. 180 tablet 3   Cholecalciferol 50 MCG (2000 UT) TABS 1 tab by mouth once daily 30 tablet 99   clopidogrel (PLAVIX) 75 MG tablet Take 1 tablet (75 mg total) by mouth daily. 90 tablet 11   clopidogrel (PLAVIX) 75 MG tablet Take 1 tablet (75 mg total) by mouth daily. 30 tablet 11   fexofenadine (ALLEGRA) 180 MG tablet Take 180 mg by mouth daily.     furosemide (LASIX) 40 MG tablet TAKE 1 TABLET BY MOUTH DAILY 90 tablet 3   hydrocortisone cream 1 % Apply 1 application topically 3 (three) times daily as needed for itching.     lidocaine (XYLOCAINE) 2 % solution Use as directed 10 mLs in the mouth or throat every 6 (six) hours as needed (stomach pain). 100 mL 0   methocarbamol (ROBAXIN) 500 MG tablet Take 2 tablets (1,000 mg total) by mouth at bedtime. 20 tablet 0   pantoprazole (PROTONIX) 40 MG tablet Take 1 tablet (40 mg total) by mouth daily. 90 tablet 3   spironolactone (ALDACTONE) 50 MG tablet Take 1 tablet (50 mg total) by mouth daily. 90 tablet 3   triamcinolone (NASACORT) 55 MCG/ACT AERO nasal inhaler Place 1 spray into the nose daily. 1 Inhaler 11   losartan  (COZAAR) 50 MG tablet Take 1 tablet (50 mg total) by mouth daily. 90 tablet 3   rosuvastatin (CRESTOR) 20 MG tablet Take 1 tablet (20 mg total) by mouth daily. 90 tablet 3   No current facility-administered medications on file prior to visit.        ROS:  All others reviewed and negative.  Objective        PE:  BP (!) 144/66   Pulse 83   Temp 98 F (36.7 C) (Temporal)   Ht 5\' 5"  (1.651 m)   Wt 185 lb 4 oz (84 kg)   SpO2 97%   BMI 30.83 kg/m                 Constitutional: Pt appears in NAD  HENT: Head: NCAT.                Right Ear: External ear normal.                 Left Ear: External ear normal.                Eyes: . Pupils are equal, round, and reactive to light. Conjunctivae and EOM are normal               Nose: without d/c or deformity               Neck: Neck supple. Gross normal ROM               Cardiovascular: Normal rate and regular rhythm.                 Pulmonary/Chest: Effort normal and breath sounds without rales or wheezing.                Abd:  Soft, NT, ND, + BS, no organomegaly               Neurological: Pt is alert. At baseline orientation, motor grossly intact               Skin: Skin is warm. LE edema - none, diffuse erythem rash to torso and extremities no longer with angioedema but with trace bilat pedal edema                Psychiatric: Pt behavior is normal without agitation   Micro: none  Cardiac tracings I have personally interpreted today:  none  Pertinent Radiological findings (summarize): none   Lab Results  Component Value Date   WBC 5.0 12/14/2021   HGB 12.6 04/03/2022   HCT 37.0 04/03/2022   PLT 302 12/14/2021   GLUCOSE 109 (H) 04/03/2022   CHOL 117 12/11/2021   TRIG 130.0 12/11/2021   HDL 31.30 (L) 12/11/2021   LDLDIRECT 92.0 04/24/2017   LDLCALC 60 12/11/2021   ALT 18 12/14/2021   AST 23 12/14/2021   NA 141 04/03/2022   K 3.8 04/03/2022   CL 105 04/03/2022   CREATININE 1.30 (H) 04/03/2022   BUN 16  04/03/2022   CO2 27 12/14/2021   TSH 0.68 12/11/2021   INR 1.1 12/14/2021   HGBA1C 6.9 (H) 12/11/2021   MICROALBUR 0.8 04/13/2021   Assessment/Plan:  [name] is a 72 y.o. Black or African American [2] female with  has a past medical history of ALLERGIC RHINITIS (03/17/2008), Anemia, iron deficiency, Blood transfusion complicating pregnancy, CAD (coronary artery disease), Cardiomyopathy, Cervical disc disease, COPD (chronic obstructive pulmonary disease) ([place]) (06/14/2010), DJD (degenerative joint disease), Genital warts, GOITER, MULTINODULAR (12/23/2009), Hyperlipidemia (11/23/2015), Hypertension, Hypothyroid, Myocardial infarction ([place]), and Systolic CHF ([place]).  CKD (chronic kidney disease) Lab Results  Component Value Date   CREATININE 1.30 (H) 04/03/2022   Stable overall, cont to avoid nephrotoxins  Essential hypertension BP Readings from Last 3 Encounters:  04/11/22 (!) 144/66  04/03/22 133/63  03/23/22 109/74   Mild uncontrolled, likely situational, pt to continue medical treatment coreg 25 bid, losartan 50 qd   Hypothyroidism Lab Results  Component Value Date   TSH 0.68 12/11/2021   Stable, pt to continue levothyroxine 200 mcg + 75 mcg qd  Rash Likely delayed reaction due to IV dye, for depomedrol IM 80 mg, and prednisone taper asd  Followup: Return if symptoms worsen or fail to improve.  Cathlean Cower,  MD 04/11/2022 7:57 PM [facility]

## 2022-04-11 NOTE — Assessment & Plan Note (Signed)
Lab Results  Component Value Date   TSH 0.68 12/11/2021   Stable, pt to continue levothyroxine 200 mcg + 75 mcg qd

## 2022-04-11 NOTE — Assessment & Plan Note (Signed)
Likely delayed reaction due to IV dye, for depomedrol IM 80 mg, and prednisone taper asd

## 2022-04-11 NOTE — Assessment & Plan Note (Signed)
Lab Results  Component Value Date   CREATININE 1.30 (H) 04/03/2022   Stable overall, cont to avoid nephrotoxins

## 2022-04-11 NOTE — Patient Instructions (Signed)
You had the steroid shot today  /Please take all new medication as prescribed - the prednisone  Please continue all other medications as before, and refills have been done if requested.  Please have the pharmacy call with any other refills you may need.  Please keep your appointments with your specialists as you may have planned  We will see you back in April 2024 as you have planned

## 2022-04-17 ENCOUNTER — Other Ambulatory Visit: Payer: Self-pay | Admitting: Internal Medicine

## 2022-04-17 MED ORDER — INCRUSE ELLIPTA 62.5 MCG/ACT IN AEPB: 1.0000 | INHALATION_SPRAY | Freq: Every day | RESPIRATORY_TRACT | 11 refills | Status: AC

## 2022-04-18 ENCOUNTER — Other Ambulatory Visit: Payer: Self-pay | Admitting: Internal Medicine

## 2022-04-18 NOTE — Telephone Encounter (Signed)
Please refill as per office routine med refill policy (all routine meds to be refilled for 3 mo or monthly (per pt preference) up to one year from last visit, then month to month grace period for 3 mo, then further med refills will have to be denied) ? ?

## 2022-04-21 ENCOUNTER — Encounter: Payer: Self-pay | Admitting: Surgery

## 2022-04-25 NOTE — Progress Notes (Signed)
Fax received from [place] for medical clearance/medication hold for dental care to be signed by Dr. Trula Slade .  Provider signed, form faxed back to sender, verified successful, sent to scan center.

## 2022-05-09 ENCOUNTER — Other Ambulatory Visit: Payer: Self-pay | Admitting: *Deleted

## 2022-05-09 DIAGNOSIS — I70222 Atherosclerosis of native arteries of extremities with rest pain, left leg: Secondary | ICD-10-CM

## 2022-05-17 NOTE — Progress Notes (Signed)
Fax received from [place] for medical clearance/medication hold for tooth extractions to be signed by Dr. Trula Slade.  Provider signed, form faxed back to sender, verified successful, sent to scan center.

## 2022-05-21 ENCOUNTER — Ambulatory Visit (INDEPENDENT_AMBULATORY_CARE_PROVIDER_SITE_OTHER): Payer: Medicare HMO | Admitting: Surgery

## 2022-05-21 ENCOUNTER — Encounter: Payer: Self-pay | Admitting: Surgery

## 2022-05-21 ENCOUNTER — Ambulatory Visit (HOSPITAL_COMMUNITY)
Admission: RE | Admit: 2022-05-21 | Discharge: 2022-05-21 | Disposition: A | Discharge status: Home / Self Care | Payer: Medicare HMO | Source: Ambulatory Visit | Attending: Surgery | Admitting: Surgery

## 2022-05-21 ENCOUNTER — Ambulatory Visit (INDEPENDENT_AMBULATORY_CARE_PROVIDER_SITE_OTHER)
Admission: RE | Admit: 2022-05-21 | Discharge: 2022-05-21 | Disposition: A | Discharge status: Home / Self Care | Payer: Medicare HMO | Source: Ambulatory Visit | Attending: Surgery | Admitting: Surgery

## 2022-05-21 VITALS — BP 137/81 | HR 65 | Temp 98.0°F | Resp 20 | Ht 65.0 in | Wt 183.0 lb

## 2022-05-21 DIAGNOSIS — I70222 Atherosclerosis of native arteries of extremities with rest pain, left leg: Secondary | ICD-10-CM

## 2022-05-21 LAB — VAS US ABI WITH/WO TBI
Left ABI: 0.93
Right ABI: 1.02

## 2022-05-21 NOTE — Progress Notes (Signed)
[facility] [place]  Pena name: [name] MRN: [id number] DOB: [birth year] Sex: female   REASON FOR VISIT:    Follow up  HISOTRY OF PRESENT ILLNESS:    [name] is a 72 y.o. female who I met in January 2024 for evaluation of peripheral vascular disease.  She had been having difficulty since October 2 when she got Ellen flu shot.  Since that time her left foot has been cold and her left great toe has become very painful, waking her up at night.  She did not have any wounds, and she was not having claudication symptoms.  I was not convinced that her toe pain was from her blood flow, however her ABI was 0.54 and so I felt she needed to have an angiogram.  In addition she was scheduled to have her nails trimmed because of fungus.  She underwent angiography on 04/03/2022.  She was found to have a 50-60% focal lesion within Ellen left adductor canal which I did not treat however she did have occlusion of Ellen below-knee popliteal artery which was recanalized and treated with drug-coated balloon angioplasty, giving her inline flow to Ellen foot  She is back today for follow-up.  She denies any further pain in her left foot.  All of her symptoms have resolved  [name] takes a statin for hypercholesterolemia.  She is medically managed for hypertension.  She is a former smoker with COPD.  She has a history of coronary artery disease, status post MI and cardiomyopathy and ICD   PAST MEDICAL HISTORY:   Past Medical History:  Diagnosis Date   ALLERGIC RHINITIS 03/17/2008   Anemia, iron deficiency    Blood transfusion complicating pregnancy    after first child at 38 yo   CAD (coronary artery disease)    Nonobstructive by cardiac catheter 8/12:EF 10-15%, circumflex calcified without obstructive CAD, proximal LAD 20%, mid LAD 20%, proximal RCA 20%, mid RCA 20%.   Cardiomyopathy    a. remote h/o cath with reported nonobs CAD; b. echo  5/12: EF 15%, mild LVH, mild MR, LAE, mod pericardial effusion with increased filling pressures   Cervical disc disease    with recurrent radicular pain, Dr. Eulas Post   COPD (chronic obstructive pulmonary disease) ([place]) 06/14/2010   DJD (degenerative joint disease)    hands   Genital warts    hx   GOITER, MULTINODULAR 12/23/2009   Hyperlipidemia 11/23/2015   Hypertension    Hypothyroid    Myocardial infarction ([place])    Systolic CHF ([place])      FAMILY HISTORY:   Family History  Problem Relation Age of Onset   Stroke Mother    Heart disease Mother    Hypertension Mother    Diabetes Mother    Stroke Father    Heart disease Father    Hypertension Father    Diabetes Father    Cancer Sister        colon   Heart disease Sister        pacemaker   Hypertension Sister    Depression Sister    Diabetes Sister    Thyroid disease Sister        uncertain type   Transient ischemic attack Maternal Grandfather    Alcohol abuse Other    Malignant hyperthermia Other     SOCIAL HISTORY:   Social History   Tobacco Use   Smoking status: Former    Packs/day: 0.00    Types:  Cigarettes   Smokeless tobacco: Never  Substance Use Topics   Alcohol use: No    Alcohol/week: 0.0 standard drinks of alcohol     ALLERGIES:   Allergies  Allergen Reactions   Ivp Dye [Iodinated Contrast Media] Itching and Rash    rash, itching & peel (Pena has a MILDER reaction when given w/benadryl)   Latex Rash    Allergic to latex gloves   Penicillins Itching, Swelling, Rash and Other (See Comments)    Has Pena had a PCN reaction causing immediate rash, facial/tongue/throat swelling, SOB or lightheadedness with hypotension:Yes Has Pena had a PCN reaction causing severe rash involving mucus membranes or skin necrosis:Yes Has Pena had a PCN reaction that required hospitalization:Pt. Was inpatient when reaction occurred Has Pena had a PCN reaction occurring within Ellen last 10 years: Yes If  all of Ellen above answers are "NO", then may proceed with Cephalosporin use.    Strawberry Extract Swelling and Rash          CURRENT MEDICATIONS:   Current Outpatient Medications  Medication Sig Dispense Refill   albuterol (VENTOLIN HFA) 108 (90 Base) MCG/ACT inhaler Inhale 2 puffs into Ellen lungs every 6 (six) hours as needed for wheezing or shortness of breath. 8 g 11   alum & mag hydroxide-simeth (MAALOX MAX) 400-400-40 MG/5ML suspension Take 10 mLs by mouth every 6 (six) hours as needed for indigestion. 355 mL 0   aspirin 81 MG EC tablet Take 81 mg by mouth daily.     carvedilol (COREG) 25 MG tablet Take 1 tablet (25 mg total) by mouth 2 (two) times daily with a meal. 180 tablet 3   Cholecalciferol 50 MCG (2000 UT) TABS 1 tab by mouth once daily 30 tablet 99   clopidogrel (PLAVIX) 75 MG tablet Take 1 tablet (75 mg total) by mouth daily. 90 tablet 11   clopidogrel (PLAVIX) 75 MG tablet Take 1 tablet (75 mg total) by mouth daily. 30 tablet 11   fexofenadine (ALLEGRA) 180 MG tablet Take 180 mg by mouth daily.     furosemide (LASIX) 40 MG tablet TAKE 1 TABLET BY MOUTH DAILY 90 tablet 3   hydrocortisone cream 1 % Apply 1 application topically 3 (three) times daily as needed for itching.     levothyroxine (SYNTHROID) 100 MCG tablet Take 2 tablets (200 mcg total) by mouth daily. 180 tablet 3   levothyroxine (SYNTHROID) 75 MCG tablet Take 1 tablet (75 mcg total) by mouth daily. 90 tablet 3   lidocaine (XYLOCAINE) 2 % solution Use as directed 10 mLs in Ellen mouth or throat every 6 (six) hours as needed (stomach pain). 100 mL 0   pantoprazole (PROTONIX) 40 MG tablet TAKE 1 TABLET(40 MG) BY MOUTH DAILY 90 tablet 3   predniSONE (DELTASONE) 10 MG tablet 3 tabs by mouth per day for 3 days,2tabs per day for 3 days,1tab per day for 3 days 18 tablet 0   spironolactone (ALDACTONE) 50 MG tablet Take 1 tablet (50 mg total) by mouth daily. 90 tablet 3   tiotropium (SPIRIVA) 18 MCG inhalation capsule Place 1  capsule (18 mcg total) into inhaler and inhale every morning. 90 capsule 3   triamcinolone (NASACORT) 55 MCG/ACT AERO nasal inhaler Place 1 spray into Ellen nose daily. 1 Inhaler 11   umeclidinium bromide (INCRUSE ELLIPTA) 62.5 MCG/ACT AEPB Inhale 1 puff into Ellen lungs daily. 1 each 11   losartan (COZAAR) 50 MG tablet Take 1 tablet (50 mg total) by mouth daily. [place]  tablet 3   methocarbamol (ROBAXIN) 500 MG tablet Take 2 tablets (1,000 mg total) by mouth at bedtime. (Pena not taking: Reported on 05/21/2022) 20 tablet 0   rosuvastatin (CRESTOR) 20 MG tablet Take 1 tablet (20 mg total) by mouth daily. 90 tablet 3   No current facility-administered medications for this visit.    REVIEW OF SYSTEMS:   '[X]'$  denotes positive finding, '[ ]'$  denotes negative finding Cardiac  Comments:  Chest pain or chest pressure:    Shortness of breath upon exertion:    Short of breath when lying flat:    Irregular heart rhythm:        Vascular    Pain in calf, thigh, or hip brought on by ambulation:    Pain in feet at night that wakes you up from your sleep:     Blood clot in your veins:    Leg swelling:         Pulmonary    Oxygen at home:    Productive cough:     Wheezing:         Neurologic    Sudden weakness in arms or legs:     Sudden numbness in arms or legs:     Sudden onset of difficulty speaking or slurred speech:    Temporary loss of vision in one eye:     Problems with dizziness:         Gastrointestinal    Blood in stool:     Vomited blood:         Genitourinary    Burning when urinating:     Blood in urine:        Psychiatric    Major depression:         Hematologic    Bleeding problems:    Problems with blood clotting too easily:        Skin    Rashes or ulcers:        Constitutional    Fever or chills:      PHYSICAL EXAM:   Vitals:   05/21/22 1343  BP: 137/81  Pulse: 65  Resp: 20  Temp: 98 F (36.7 C)  SpO2: 96%  Weight: 183 lb (83 kg)  Height: '5\' 5"'$  (1.651 m)     GENERAL: [name] is a well-nourished female, in no acute distress. Ellen vital signs are documented above. CARDIAC: There is a regular rate and rhythm.  PULMONARY: Non-labored respirations.  MUSCULOSKELETAL: There are no major deformities or cyanosis. NEUROLOGIC: No focal weakness or paresthesias are detected. SKIN: There are no ulcers or rashes noted. PSYCHIATRIC: [name] has a normal affect.  STUDIES:   I have reviewed Ellen following:   ABI/TBIToday's ABIToday's TBIPrevious ABIPrevious TBI  +-------+-----------+-----------+------------+------------+  Right 1.02       0.91       0.88        0.61          +-------+-----------+-----------+------------+------------+  Left  0.93       0.67       0.54        0.07          +-------+-----------+-----------+------------+------------+  Pressure: 137 Left toe pressure: 101 Right pedal waveforms are triphasic Left pedal waveforms are biphasic   +-----------+--------+-----+---------------+----------+--------+  LEFT      PSV cm/sRatioStenosis       Waveform  Comments  +-----------+--------+-----+---------------+----------+--------+  CFA Prox   93  biphasic            +-----------+--------+-----+---------------+----------+--------+  DFA       81                          biphasic            +-----------+--------+-----+---------------+----------+--------+  SFA Prox   89                          biphasic            +-----------+--------+-----+---------------+----------+--------+  SFA Mid    206          50-74% stenosisbiphasic            +-----------+--------+-----+---------------+----------+--------+  SFA Distal 52                          monophasic          +-----------+--------+-----+---------------+----------+--------+  POP Prox   25                          biphasic             +-----------+--------+-----+---------------+----------+--------+  ATA Distal 58                          biphasic            +-----------+--------+-----+---------------+----------+--------+  PTA Distal 21                          monophasic          +-----------+--------+-----+---------------+----------+--------+  PERO Distal             occluded                           +-----------+--------+-----+---------------+----------+--------+  MEDICAL ISSUES:   PAD: [name] has had resolution of her left toe pain and symptoms following recanalization of an occluded popliteal artery.  On ultrasound today her intervention is widely patent and her ABIs are significantly improved.  I would recommend that she continue taking aspirin, statin and Plavix, however she can discontinue her Plavix temporarily prior to any dental work or other procedure that is necessary.  I did discuss with her that there is a possibility of recurrence.  We will keep her on a surveillance protocol to monitor this.  She will follow-up in 3 months with repeat imaging.    Leia Alf, MD, FACS [facility] [phone] Pager [phone]

## 2022-05-23 ENCOUNTER — Other Ambulatory Visit: Payer: Self-pay

## 2022-05-23 DIAGNOSIS — I70222 Atherosclerosis of native arteries of extremities with rest pain, left leg: Secondary | ICD-10-CM

## 2022-06-11 NOTE — Progress Notes (Signed)
HPI: FU dilated cardiomyopathy. She was in the hospital in March 2012 with severe hypothyroidism and CHF. Followup echocardiogram in June 2012 demonstrated continued LV dysfunction with an EF of 15%. Cardiac catheterization was performed 10/25/10: EF 10-15%, circumflex calcified without obstructive CAD, proximal LAD 20%, mid LAD 20%, proximal RCA 20%, mid RCA 20%. She was felt to have mild CAD and a nonischemic cardiomyopathy. Also with h/o BIV ICD. Echocardiogram September 2020 showed ejection fraction 55 to 60%, grade 1 diastolic dysfunction.  Abdominal aortogram January 2024 showed no renal artery stenosis, diffusely diseased right SFA in the mild to moderate range and 50 to 60% diffuse disease in the left SFA; left popliteal artery occluded with reconstitution.  Patient had drug-coated balloon angioplasty of the popliteal artery.  Since I last saw her, she denies dyspnea, chest pain, palpitations or syncope.  Current Outpatient Medications  Medication Sig Dispense Refill   albuterol (VENTOLIN HFA) 108 (90 Base) MCG/ACT inhaler Inhale 2 puffs into the lungs every 6 (six) hours as needed for wheezing or shortness of breath. 8 g 11   alum & mag hydroxide-simeth (MAALOX MAX) 400-400-40 MG/5ML suspension Take 10 mLs by mouth every 6 (six) hours as needed for indigestion. 355 mL 0   aspirin 81 MG EC tablet Take 81 mg by mouth daily.     carvedilol (COREG) 25 MG tablet Take 1 tablet (25 mg total) by mouth 2 (two) times daily with a meal. 180 tablet 3   Cholecalciferol 50 MCG (2000 UT) TABS 1 tab by mouth once daily 30 tablet 99   clopidogrel (PLAVIX) 75 MG tablet Take 1 tablet (75 mg total) by mouth daily. 90 tablet 11   fexofenadine (ALLEGRA) 180 MG tablet Take 180 mg by mouth daily.     furosemide (LASIX) 40 MG tablet TAKE 1 TABLET BY MOUTH DAILY 90 tablet 3   hydrocortisone cream 1 % Apply 1 application topically 3 (three) times daily as needed for itching.     levothyroxine (SYNTHROID) 100 MCG  tablet Take 2 tablets (200 mcg total) by mouth daily. 180 tablet 3   levothyroxine (SYNTHROID) 75 MCG tablet Take 1 tablet (75 mcg total) by mouth daily. 90 tablet 3   lidocaine (XYLOCAINE) 2 % solution Use as directed 10 mLs in the mouth or throat every 6 (six) hours as needed (stomach pain). 100 mL 0   losartan (COZAAR) 50 MG tablet Take 1 tablet (50 mg total) by mouth daily. 90 tablet 3   methocarbamol (ROBAXIN) 500 MG tablet Take 2 tablets (1,000 mg total) by mouth at bedtime. 20 tablet 0   pantoprazole (PROTONIX) 40 MG tablet TAKE 1 TABLET(40 MG) BY MOUTH DAILY 90 tablet 3   predniSONE (DELTASONE) 10 MG tablet 3 tabs by mouth per day for 3 days,2tabs per day for 3 days,1tab per day for 3 days 18 tablet 0   rosuvastatin (CRESTOR) 20 MG tablet Take 1 tablet (20 mg total) by mouth daily. 90 tablet 3   spironolactone (ALDACTONE) 50 MG tablet Take 1 tablet (50 mg total) by mouth daily. 90 tablet 3   tiotropium (SPIRIVA) 18 MCG inhalation capsule Place 1 capsule (18 mcg total) into inhaler and inhale every morning. 90 capsule 3   triamcinolone (NASACORT) 55 MCG/ACT AERO nasal inhaler Place 1 spray into the nose daily. 1 Inhaler 11   umeclidinium bromide (INCRUSE ELLIPTA) 62.5 MCG/ACT AEPB Inhale 1 puff into the lungs daily. 1 each 11   No current facility-administered medications for this  visit.     Past Medical History:  Diagnosis Date   ALLERGIC RHINITIS 03/17/2008   Anemia, iron deficiency    Blood transfusion complicating pregnancy    after first child at 26 yo   CAD (coronary artery disease)    Nonobstructive by cardiac catheter 8/12:EF 10-15%, circumflex calcified without obstructive CAD, proximal LAD 20%, mid LAD 20%, proximal RCA 20%, mid RCA 20%.   Cardiomyopathy    a. remote h/o cath with reported nonobs CAD; b. echo 5/12: EF 15%, mild LVH, mild MR, LAE, mod pericardial effusion with increased filling pressures   Cervical disc disease    with recurrent radicular pain, Dr. Montez Morita    COPD (chronic obstructive pulmonary disease) 06/14/2010   DJD (degenerative joint disease)    hands   Genital warts    hx   GOITER, MULTINODULAR 12/23/2009   Hyperlipidemia 11/23/2015   Hypertension    Hypothyroid    Myocardial infarction    Systolic CHF     Past Surgical History:  Procedure Laterality Date   ABDOMINAL AORTOGRAM W/LOWER EXTREMITY N/A 04/03/2022   Procedure: ABDOMINAL AORTOGRAM W/LOWER EXTREMITY;  Surgeon: Nada Libman, MD;  Location: [facility] INVASIVE CV LAB;  Service: Cardiovascular;  Laterality: N/A;   CARDIAC CATHETERIZATION  1995   with "timy blockage" [facility] Dr. Daisy [place]o   CARDIAC DEFIBRILLATOR PLACEMENT  01-22-11   by Dr.Taylor   CHOLECYSTECTOMY     EP IMPLANTABLE DEVICE N/A 01/24/2016   Procedure: ICD Generator Changeout;  Surgeon: Marinus Maw, MD;  Location: [facility] INVASIVE CV LAB;  Service: Cardiovascular;  Laterality: N/A;   IMPLANTABLE CARDIOVERTER DEFIBRILLATOR IMPLANT N/A 01/22/2011   Procedure: IMPLANTABLE CARDIOVERTER DEFIBRILLATOR IMPLANT;  Surgeon: Marinus Maw, MD;  Location[facility] CATH LAB;  Service: Cardiovascular;  Laterality: N/A;   PERIPHERAL VASCULAR BALLOON ANGIOPLASTY Left 04/03/2022   Procedure: PERIPHERAL VASCULAR BALLOON ANGIOPLASTY;  Surgeon: Nada Libman, MD;  Location: [facility] INVASIVE CV LAB;  Service: Cardiovascular;  Laterality: Left;  pop    Social History   Socioeconomic History   Marital status: Single    Spouse name: Not on file   Number of children: 3   Years of education: Not on file   Highest education level: Not on file  Occupational History   Occupation: Retired    Associate Professor: Advice worker [employer]  Tobacco Use   Smoking status: Former    Packs/day: 0    Types: Cigarettes   Smokeless tobacco: Never  Vaping Use   Vaping Use: Never used  Substance and Sexual Activity   Alcohol use: No    Alcohol/week: 0.0 standard drinks of alcohol   Drug use: No   Sexual activity: Never  Other Topics Concern   Not on file   Social History Narrative   Not on file   Social Determinants of Health   Financial Resource Strain: Low Risk  (01/12/2020)   Overall Financial Resource Strain (CARDIA)    Difficulty of Paying Living Expenses: Not hard at all  Food Insecurity: No Food Insecurity (01/12/2020)   Hunger Vital Sign    Worried About Running Out of Food in the Last Year: Never true    Ran Out of Food in the Last Year: Never true  Transportation Needs: No Transportation Needs (01/12/2020)   PRAPARE - Administrator, Civil Service (Medical): No    Lack of Transportation (Non-Medical): No  Physical Activity: Sufficiently Active (01/12/2020)   Exercise Vital Sign    Days of Exercise per Week: 5  days    Minutes of Exercise per Session: 30 min  Stress: No Stress Concern Present (01/12/2020)   [employer] of Occupational Health - Occupational Stress Questionnaire    Feeling of Stress : Not at all  Social Connections: Socially Integrated (01/12/2020)   Social Connection and Isolation Panel [NHANES]    Frequency of Communication with Friends and Family: More than three times a week    Frequency of Social Gatherings with Friends and Family: More than three times a week    Attends Religious Services: More than 4 times per year    Active Member of [employer] or Organizations: Yes    Attends Engineer, structural: More than 4 times per year    Marital Status: Married  Catering manager Violence: Not on file    Family History  Problem Relation Age of Onset   Stroke Mother    Heart disease Mother    Hypertension Mother    Diabetes Mother    Stroke Father    Heart disease Father    Hypertension Father    Diabetes Father    Cancer Sister        colon   Heart disease Sister        pacemaker   Hypertension Sister    Depression Sister    Diabetes Sister    Thyroid disease Sister        uncertain type   Transient ischemic attack Maternal Grandfather    Alcohol abuse Other    Malignant  hyperthermia Other     ROS: no fevers or chills, productive cough, hemoptysis, dysphasia, odynophagia, melena, hematochezia, dysuria, hematuria, rash, seizure activity, orthopnea, PND, pedal edema, claudication. Remaining systems are negative.  Physical Exam: Well-developed well-nourished in no acute distress.  Skin is warm and dry.  HEENT is normal.  Neck is supple.  Chest is clear to auscultation with normal expansion.  Cardiovascular exam is regular rate and rhythm.  Abdominal exam nontender or distended. No masses palpated. Extremities show no edema. neuro grossly intact   A/P  1 nonischemic cardiomyopathy-LV function has improved on most recent echocardiogram.  Will repeat.  Continue ARB and beta-blocker.  2 chronic combined systolic/diastolic congestive heart failure-she remains euvolemic on examination.  Continue diuretic at present dose.  3 hypertension-blood pressure mildly elevated; however she follows this at home and it is typically controlled.  Continue present medications and follow-up.  4 hyperlipidemia-continue statin.  5 coronary artery disease-continue aspirin and statin.  Patient denies chest pain.  6 biventricular ICD-she is scheduled for follow-up with Dr. Ladona Ridgel tomorrow.  7 peripheral vascular disease-continue aspirin and statin.  Followed by vascular surgery.  Olga Millers, MD

## 2022-06-12 ENCOUNTER — Ambulatory Visit: Payer: Medicare Other | Admitting: Internal Medicine

## 2022-06-19 ENCOUNTER — Ambulatory Visit: Payer: Medicare HMO | Attending: Cardiology | Admitting: Cardiology

## 2022-06-19 ENCOUNTER — Encounter: Payer: Self-pay | Admitting: Cardiology

## 2022-06-19 VITALS — BP 140/78 | HR 70 | Ht 65.0 in | Wt 179.6 lb

## 2022-06-19 DIAGNOSIS — I428 Other cardiomyopathies: Secondary | ICD-10-CM | POA: Diagnosis not present

## 2022-06-19 DIAGNOSIS — I10 Essential (primary) hypertension: Secondary | ICD-10-CM

## 2022-06-19 DIAGNOSIS — Z95810 Presence of automatic (implantable) cardiac defibrillator: Secondary | ICD-10-CM | POA: Diagnosis not present

## 2022-06-19 DIAGNOSIS — I739 Peripheral vascular disease, unspecified: Secondary | ICD-10-CM

## 2022-06-19 DIAGNOSIS — I2510 Atherosclerotic heart disease of native coronary artery without angina pectoris: Secondary | ICD-10-CM

## 2022-06-19 NOTE — Patient Instructions (Signed)
  Testing/Procedures:  Your physician has requested that you have an echocardiogram. Echocardiography is a painless test that uses sound waves to create images of your heart. It provides your doctor with information about the size and shape of your heart and how well your heart's chambers and valves are working. This procedure takes approximately one hour. There are no restrictions for this procedure. Please do NOT wear cologne, perfume, aftershave, or lotions (deodorant is allowed). Please arrive 15 minutes prior to your appointment time. [address]   Follow-Up: At [facility], you and your health needs are our priority.  As part of our continuing mission to provide you with exceptional heart care, we have created designated Provider Care Teams.  These Care Teams include your primary Cardiologist (physician) and Advanced Practice Providers (APPs -  Physician Assistants and Nurse Practitioners) who all work together to provide you with the care you need, when you need it.  We recommend signing up for the patient portal called "MyChart".  Sign up information is provided on this After Visit Summary.  MyChart is used to connect with patients for Virtual Visits (Telemedicine).  Patients are able to view lab/test results, encounter notes, upcoming appointments, etc.  Non-urgent messages can be sent to your provider as well.   To learn more about what you can do with MyChart, go to [email or web address].    Your next appointment:   12 month(s)  Provider:   BRIAN CRENSHAW MD    

## 2022-06-21 ENCOUNTER — Ambulatory Visit (INDEPENDENT_AMBULATORY_CARE_PROVIDER_SITE_OTHER): Payer: Medicare HMO

## 2022-06-21 DIAGNOSIS — I428 Other cardiomyopathies: Secondary | ICD-10-CM

## 2022-06-21 LAB — CUP PACEART REMOTE DEVICE CHECK
Battery Remaining Longevity: 30 mo
Battery Voltage: 2.94 V
Brady Statistic AP VP Percent: 18.65 %
Brady Statistic AP VS Percent: 0.01 %
Brady Statistic AS VP Percent: 81.06 %
Brady Statistic AS VS Percent: 0.28 %
Brady Statistic RA Percent Paced: 18.59 %
Brady Statistic RV Percent Paced: 99.41 %
Date Time Interrogation Session: 20240411133912
Implantable Lead Connection Status: 753985
Implantable Lead Connection Status: 753985
Implantable Lead Connection Status: 753985
Implantable Lead Implant Date: 20121112
Implantable Lead Implant Date: 20121112
Implantable Lead Implant Date: 20121112
Implantable Lead Location: 753858
Implantable Lead Location: 753859
Implantable Lead Location: 753860
Implantable Lead Model: 4194
Implantable Lead Model: 5076
Implantable Lead Model: 6947
Implantable Pulse Generator Implant Date: 20171114
Lead Channel Impedance Value: 342 Ohm
Lead Channel Impedance Value: 361 Ohm
Lead Channel Impedance Value: 437 Ohm
Lead Channel Impedance Value: 456 Ohm
Lead Channel Impedance Value: 494 Ohm
Lead Channel Impedance Value: 532 Ohm
Lead Channel Impedance Value: 570 Ohm
Lead Channel Impedance Value: 665 Ohm
Lead Channel Impedance Value: 779 Ohm
Lead Channel Pacing Threshold Amplitude: 0.5 V
Lead Channel Pacing Threshold Amplitude: 0.875 V
Lead Channel Pacing Threshold Pulse Width: 0.4 ms
Lead Channel Pacing Threshold Pulse Width: 0.4 ms
Lead Channel Sensing Intrinsic Amplitude: 3.125 mV
Lead Channel Sensing Intrinsic Amplitude: 3.125 mV
Lead Channel Sensing Intrinsic Amplitude: 9.25 mV
Lead Channel Sensing Intrinsic Amplitude: 9.25 mV
Lead Channel Setting Pacing Amplitude: 1.5 V
Lead Channel Setting Pacing Amplitude: 1.5 V
Lead Channel Setting Pacing Amplitude: 2 V
Lead Channel Setting Pacing Pulse Width: 0.4 ms
Lead Channel Setting Pacing Pulse Width: 1 ms
Lead Channel Setting Sensing Sensitivity: 2.8 mV
Zone Setting Status: 755011
Zone Setting Status: 755011

## 2022-07-19 ENCOUNTER — Ambulatory Visit (HOSPITAL_COMMUNITY): Payer: Medicare HMO | Attending: Cardiology

## 2022-07-19 DIAGNOSIS — I428 Other cardiomyopathies: Secondary | ICD-10-CM | POA: Insufficient documentation

## 2022-07-19 DIAGNOSIS — I517 Cardiomegaly: Secondary | ICD-10-CM

## 2022-07-19 DIAGNOSIS — I083 Combined rheumatic disorders of mitral, aortic and tricuspid valves: Secondary | ICD-10-CM | POA: Diagnosis not present

## 2022-07-19 LAB — ECHOCARDIOGRAM COMPLETE
Area-P 1/2: 2.56 cm2
Est EF: 55
P 1/2 time: 868 msec
S' Lateral: 1.8 cm

## 2022-07-24 ENCOUNTER — Encounter: Payer: Self-pay | Admitting: *Deleted

## 2022-07-26 NOTE — Progress Notes (Signed)
Remote pacemaker transmission.   

## 2022-08-20 ENCOUNTER — Ambulatory Visit: Payer: Medicare HMO | Admitting: Surgery

## 2022-08-20 ENCOUNTER — Ambulatory Visit (HOSPITAL_COMMUNITY): Payer: Medicare HMO

## 2022-09-10 ENCOUNTER — Ambulatory Visit (INDEPENDENT_AMBULATORY_CARE_PROVIDER_SITE_OTHER)
Admission: RE | Admit: 2022-09-10 | Discharge: 2022-09-10 | Disposition: A | Discharge status: Home / Self Care | Payer: Medicare HMO | Source: Ambulatory Visit | Attending: Surgery | Admitting: Surgery

## 2022-09-10 ENCOUNTER — Ambulatory Visit (HOSPITAL_COMMUNITY)
Admission: RE | Admit: 2022-09-10 | Discharge: 2022-09-10 | Disposition: A | Discharge status: Home / Self Care | Payer: Medicare HMO | Source: Ambulatory Visit | Attending: Surgery | Admitting: Surgery

## 2022-09-10 DIAGNOSIS — I70222 Atherosclerosis of native arteries of extremities with rest pain, left leg: Secondary | ICD-10-CM | POA: Diagnosis not present

## 2022-09-11 LAB — VAS US ABI WITH/WO TBI
Left ABI: 0.85
Right ABI: 0.89

## 2022-09-20 ENCOUNTER — Ambulatory Visit: Payer: Self-pay

## 2022-09-20 DIAGNOSIS — I428 Other cardiomyopathies: Secondary | ICD-10-CM | POA: Diagnosis not present

## 2022-09-24 ENCOUNTER — Ambulatory Visit (INDEPENDENT_AMBULATORY_CARE_PROVIDER_SITE_OTHER): Payer: Medicare HMO | Admitting: Surgery

## 2022-09-24 ENCOUNTER — Encounter: Payer: Self-pay | Admitting: Surgery

## 2022-09-24 VITALS — BP 150/83 | HR 64 | Temp 98.0°F | Resp 20 | Ht 65.0 in | Wt 175.9 lb

## 2022-09-24 DIAGNOSIS — I70213 Atherosclerosis of native arteries of extremities with intermittent claudication, bilateral legs: Secondary | ICD-10-CM | POA: Diagnosis not present

## 2022-09-24 NOTE — Progress Notes (Signed)
[facility] [place]  Patient name: [name] MRN: [id number] DOB: October 23, 1950 Sex: female   REASON FOR VISIT:    Follow up  HISOTRY OF PRESENT ILLNESS:   [name] is a 72 y.o. female who I met in January 2024 for evaluation of peripheral vascular disease.  She had been having difficulty since October 2 when she got the flu shot.  Since that time her left foot has been cold and her left great toe has become very painful, waking her up at night.  She did not have any wounds, and she was not having claudication symptoms.  I was not convinced that her toe pain was from her blood flow, however her ABI was 0.54 and so I felt she needed to have an angiogram.  In addition she was scheduled to have her nails trimmed because of fungus.  She underwent angiography on 04/03/2022.  She was found to have a 50-60% focal lesion within the left adductor canal which I did not treat however she did have occlusion of the below-knee popliteal artery which was recanalized and treated with drug-coated balloon angioplasty, giving her inline flow to the foot.  After treatment, she told me all of her symptoms had resolved.  She is back today for surveillance imaging   The patient takes a statin for hypercholesterolemia.  She is medically managed for hypertension.  She is a former smoker with COPD.  She has a history of coronary artery disease, status post MI and cardiomyopathy and ICD    PAST MEDICAL HISTORY:   Past Medical History:  Diagnosis Date   ALLERGIC RHINITIS 03/17/2008   Anemia, iron deficiency    Blood transfusion complicating pregnancy    after first child at 60 yo   CAD (coronary artery disease)    Nonobstructive by cardiac catheter 8/12:EF 10-15%, circumflex calcified without obstructive CAD, proximal LAD 20%, mid LAD 20%, proximal RCA 20%, mid RCA 20%.   Cardiomyopathy    a. remote h/o cath with reported nonobs CAD; b. echo 5/12: EF  15%, mild LVH, mild MR, LAE, mod pericardial effusion with increased filling pressures   Cervical disc disease    with recurrent radicular pain, Dr. Montez Morita   COPD (chronic obstructive pulmonary disease) (HCC) 06/14/2010   DJD (degenerative joint disease)    hands   Genital warts    hx   GOITER, MULTINODULAR 12/23/2009   Hyperlipidemia 11/23/2015   Hypertension    Hypothyroid    Myocardial infarction (HCC)    Systolic CHF (HCC)      FAMILY HISTORY:   Family History  Problem Relation Age of Onset   Stroke Mother    Heart disease Mother    Hypertension Mother    Diabetes Mother    Stroke Father    Heart disease Father    Hypertension Father    Diabetes Father    Cancer Sister        colon   Heart disease Sister        pacemaker   Hypertension Sister    Depression Sister    Diabetes Sister    Thyroid disease Sister        uncertain type   Transient ischemic attack Maternal Grandfather    Alcohol abuse Other    Malignant hyperthermia Other     SOCIAL HISTORY:   Social History   Tobacco Use   Smoking status: Former    Types: Cigarettes   Smokeless tobacco: Never  Substance  Use Topics   Alcohol use: No    Alcohol/week: 0.0 standard drinks of alcohol     ALLERGIES:   Allergies  Allergen Reactions   Ivp Dye [Iodinated Contrast Media] Itching and Rash    rash, itching & peel (patient has a MILDER reaction when given w/benadryl)   Latex Rash    Allergic to latex gloves   Penicillins Itching, Swelling, Rash and Other (See Comments)    Has patient had a PCN reaction causing immediate rash, facial/tongue/throat swelling, SOB or lightheadedness with hypotension:Yes Has patient had a PCN reaction causing severe rash involving mucus membranes or skin necrosis:Yes Has patient had a PCN reaction that required hospitalization:Pt. Was inpatient when reaction occurred Has patient had a PCN reaction occurring within the last 10 years: Yes If all of the above answers are  "NO", then may proceed with Cephalosporin use.    Strawberry Extract Swelling and Rash          CURRENT MEDICATIONS:   Current Outpatient Medications  Medication Sig Dispense Refill   albuterol (VENTOLIN HFA) 108 (90 Base) MCG/ACT inhaler Inhale 2 puffs into the lungs every 6 (six) hours as needed for wheezing or shortness of breath. 8 g 11   alum & mag hydroxide-simeth (MAALOX MAX) 400-400-40 MG/5ML suspension Take 10 mLs by mouth every 6 (six) hours as needed for indigestion. 355 mL 0   aspirin 81 MG EC tablet Take 81 mg by mouth daily.     carvedilol (COREG) 25 MG tablet Take 1 tablet (25 mg total) by mouth 2 (two) times daily with a meal. 180 tablet 3   Cholecalciferol 50 MCG (2000 UT) TABS 1 tab by mouth once daily 30 tablet 99   clopidogrel (PLAVIX) 75 MG tablet Take 1 tablet (75 mg total) by mouth daily. 90 tablet 11   fexofenadine (ALLEGRA) 180 MG tablet Take 180 mg by mouth daily.     furosemide (LASIX) 40 MG tablet TAKE 1 TABLET BY MOUTH DAILY 90 tablet 3   hydrocortisone cream 1 % Apply 1 application topically 3 (three) times daily as needed for itching.     levothyroxine (SYNTHROID) 100 MCG tablet Take 2 tablets (200 mcg total) by mouth daily. 180 tablet 3   levothyroxine (SYNTHROID) 75 MCG tablet Take 1 tablet (75 mcg total) by mouth daily. 90 tablet 3   lidocaine (XYLOCAINE) 2 % solution Use as directed 10 mLs in the mouth or throat every 6 (six) hours as needed (stomach pain). 100 mL 0   methocarbamol (ROBAXIN) 500 MG tablet Take 2 tablets (1,000 mg total) by mouth at bedtime. 20 tablet 0   pantoprazole (PROTONIX) 40 MG tablet TAKE 1 TABLET(40 MG) BY MOUTH DAILY 90 tablet 3   predniSONE (DELTASONE) 10 MG tablet 3 tabs by mouth per day for 3 days,2tabs per day for 3 days,1tab per day for 3 days 18 tablet 0   spironolactone (ALDACTONE) 50 MG tablet Take 1 tablet (50 mg total) by mouth daily. 90 tablet 3   tiotropium (SPIRIVA) 18 MCG inhalation capsule Place 1 capsule (18 mcg  total) into inhaler and inhale every morning. 90 capsule 3   triamcinolone (NASACORT) 55 MCG/ACT AERO nasal inhaler Place 1 spray into the nose daily. 1 Inhaler 11   umeclidinium bromide (INCRUSE ELLIPTA) 62.5 MCG/ACT AEPB Inhale 1 puff into the lungs daily. 1 each 11   losartan (COZAAR) 50 MG tablet Take 1 tablet (50 mg total) by mouth daily. 90 tablet 3   rosuvastatin (CRESTOR) 20  MG tablet Take 1 tablet (20 mg total) by mouth daily. 90 tablet 3   No current facility-administered medications for this visit.    REVIEW OF SYSTEMS:   [X]  denotes positive finding, [ ]  denotes negative finding Cardiac  Comments:  Chest pain or chest pressure:    Shortness of breath upon exertion:    Short of breath when lying flat:    Irregular heart rhythm:        Vascular    Pain in calf, thigh, or hip brought on by ambulation:    Pain in feet at night that wakes you up from your sleep:     Blood clot in your veins:    Leg swelling:         Pulmonary    Oxygen at home:    Productive cough:     Wheezing:         Neurologic    Sudden weakness in arms or legs:     Sudden numbness in arms or legs:     Sudden onset of difficulty speaking or slurred speech:    Temporary loss of vision in one eye:     Problems with dizziness:         Gastrointestinal    Blood in stool:     Vomited blood:         Genitourinary    Burning when urinating:     Blood in urine:        Psychiatric    Major depression:         Hematologic    Bleeding problems:    Problems with blood clotting too easily:        Skin    Rashes or ulcers:        Constitutional    Fever or chills:      PHYSICAL EXAM:   Vitals:   09/24/22 1343  BP: (!) 150/83  Pulse: 64  Resp: 20  Temp: 98 F (36.7 C)  SpO2: 97%  Weight: 175 lb 14.4 oz (79.8 kg)  Height: 5\' 5"  (1.651 m)    GENERAL: The patient is a well-nourished female, in no acute distress. The vital signs are documented above. CARDIAC: There is a regular rate  and rhythm.  VASCULAR: I could not palpate pedal pulses PULMONARY: Non-labored respirations MUSCULOSKELETAL: There are no major deformities or cyanosis. NEUROLOGIC: No focal weakness or paresthesias are detected. SKIN: There are no ulcers or rashes noted. PSYCHIATRIC: The patient has a normal affect.  STUDIES:   I have reviewed the following studies: ABI/TBIToday's ABIToday's TBIPrevious ABIPrevious TBI  +-------+-----------+-----------+------------+------------+  Right 0.89       0.57       1.02        0.91          +-------+-----------+-----------+------------+------------+  Left  0.85       056        0.93        0.67          +-------+-----------+-----------+------------+------------+     Left: 50-74% stenosis noted in the superficial femoral artery.    MEDICAL ISSUES:   PAD: The patient remains symptom-free.  There are some elevated velocities within the left superficial femoral artery that we will need to be monitored.  I will bring her back in 3 months.  She knows to contact me sooner should she develop issues.    Charlena Cross, MD, FACS [facility] [phone] Pager [phone]  [phone]  

## 2022-09-25 LAB — CUP PACEART REMOTE DEVICE CHECK
Battery Remaining Longevity: 29 mo
Battery Voltage: 2.93 V
Brady Statistic AP VP Percent: 19.18 %
Brady Statistic AP VS Percent: 0 %
Brady Statistic AS VP Percent: 80.77 %
Brady Statistic AS VS Percent: 0.05 %
Brady Statistic RA Percent Paced: 19.18 %
Brady Statistic RV Percent Paced: 99.91 %
Date Time Interrogation Session: 20240716192408
Implantable Lead Connection Status: 753985
Implantable Lead Connection Status: 753985
Implantable Lead Connection Status: 753985
Implantable Lead Implant Date: 20121112
Implantable Lead Implant Date: 20121112
Implantable Lead Implant Date: 20121112
Implantable Lead Location: 753858
Implantable Lead Location: 753859
Implantable Lead Location: 753860
Implantable Lead Model: 4194
Implantable Lead Model: 5076
Implantable Lead Model: 6947
Implantable Pulse Generator Implant Date: 20171114
Lead Channel Impedance Value: 342 Ohm
Lead Channel Impedance Value: 380 Ohm
Lead Channel Impedance Value: 418 Ohm
Lead Channel Impedance Value: 418 Ohm
Lead Channel Impedance Value: 456 Ohm
Lead Channel Impedance Value: 513 Ohm
Lead Channel Impedance Value: 589 Ohm
Lead Channel Impedance Value: 684 Ohm
Lead Channel Impedance Value: 798 Ohm
Lead Channel Pacing Threshold Amplitude: 0.5 V
Lead Channel Pacing Threshold Amplitude: 0.875 V
Lead Channel Pacing Threshold Pulse Width: 0.4 ms
Lead Channel Pacing Threshold Pulse Width: 0.4 ms
Lead Channel Sensing Intrinsic Amplitude: 3.5 mV
Lead Channel Sensing Intrinsic Amplitude: 3.5 mV
Lead Channel Sensing Intrinsic Amplitude: 9.25 mV
Lead Channel Sensing Intrinsic Amplitude: 9.25 mV
Lead Channel Setting Pacing Amplitude: 1.5 V
Lead Channel Setting Pacing Amplitude: 1.5 V
Lead Channel Setting Pacing Amplitude: 2 V
Lead Channel Setting Pacing Pulse Width: 0.4 ms
Lead Channel Setting Pacing Pulse Width: 1 ms
Lead Channel Setting Sensing Sensitivity: 2.8 mV
Zone Setting Status: 755011
Zone Setting Status: 755011

## 2022-10-03 ENCOUNTER — Other Ambulatory Visit: Payer: Self-pay

## 2022-10-03 DIAGNOSIS — I70213 Atherosclerosis of native arteries of extremities with intermittent claudication, bilateral legs: Secondary | ICD-10-CM

## 2022-10-09 NOTE — Progress Notes (Signed)
Remote pacemaker transmission.   

## 2022-12-20 ENCOUNTER — Ambulatory Visit: Payer: Medicare HMO

## 2022-12-20 DIAGNOSIS — I428 Other cardiomyopathies: Secondary | ICD-10-CM | POA: Diagnosis not present

## 2022-12-21 LAB — CUP PACEART REMOTE DEVICE CHECK
Battery Remaining Longevity: 27 mo
Battery Voltage: 2.93 V
Brady Statistic AP VP Percent: 24.64 %
Brady Statistic AP VS Percent: 0.01 %
Brady Statistic AS VP Percent: 75.3 %
Brady Statistic AS VS Percent: 0.06 %
Brady Statistic RA Percent Paced: 24.62 %
Brady Statistic RV Percent Paced: 99.9 %
Date Time Interrogation Session: 20241011183543
Implantable Lead Connection Status: 753985
Implantable Lead Connection Status: 753985
Implantable Lead Connection Status: 753985
Implantable Lead Implant Date: 20121112
Implantable Lead Implant Date: 20121112
Implantable Lead Implant Date: 20121112
Implantable Lead Location: 753858
Implantable Lead Location: 753859
Implantable Lead Location: 753860
Implantable Lead Model: 4194
Implantable Lead Model: 5076
Implantable Lead Model: 6947
Implantable Pulse Generator Implant Date: 20171114
Lead Channel Impedance Value: 342 Ohm
Lead Channel Impedance Value: 361 Ohm
Lead Channel Impedance Value: 418 Ohm
Lead Channel Impedance Value: 418 Ohm
Lead Channel Impedance Value: 475 Ohm
Lead Channel Impedance Value: 513 Ohm
Lead Channel Impedance Value: 551 Ohm
Lead Channel Impedance Value: 646 Ohm
Lead Channel Impedance Value: 779 Ohm
Lead Channel Pacing Threshold Amplitude: 0.5 V
Lead Channel Pacing Threshold Amplitude: 1 V
Lead Channel Pacing Threshold Pulse Width: 0.4 ms
Lead Channel Pacing Threshold Pulse Width: 0.4 ms
Lead Channel Sensing Intrinsic Amplitude: 3.25 mV
Lead Channel Sensing Intrinsic Amplitude: 3.25 mV
Lead Channel Sensing Intrinsic Amplitude: 9.25 mV
Lead Channel Sensing Intrinsic Amplitude: 9.25 mV
Lead Channel Setting Pacing Amplitude: 1.5 V
Lead Channel Setting Pacing Amplitude: 1.5 V
Lead Channel Setting Pacing Amplitude: 2 V
Lead Channel Setting Pacing Pulse Width: 0.4 ms
Lead Channel Setting Pacing Pulse Width: 1 ms
Lead Channel Setting Sensing Sensitivity: 2.8 mV
Zone Setting Status: 755011
Zone Setting Status: 755011

## 2022-12-24 ENCOUNTER — Ambulatory Visit (INDEPENDENT_AMBULATORY_CARE_PROVIDER_SITE_OTHER)
Admission: RE | Admit: 2022-12-24 | Discharge: 2022-12-24 | Disposition: A | Discharge status: Home / Self Care | Payer: Medicare HMO | Source: Ambulatory Visit | Attending: Surgery | Admitting: Surgery

## 2022-12-24 ENCOUNTER — Ambulatory Visit (HOSPITAL_COMMUNITY)
Admission: RE | Admit: 2022-12-24 | Discharge: 2022-12-24 | Disposition: A | Discharge status: Home / Self Care | Payer: Medicare HMO | Source: Ambulatory Visit | Attending: Surgery | Admitting: Surgery

## 2022-12-24 ENCOUNTER — Ambulatory Visit: Payer: Medicare HMO | Admitting: Surgery

## 2022-12-24 DIAGNOSIS — I70213 Atherosclerosis of native arteries of extremities with intermittent claudication, bilateral legs: Secondary | ICD-10-CM | POA: Diagnosis not present

## 2022-12-24 LAB — VAS US ABI WITH/WO TBI
Left ABI: 0.86
Right ABI: 0.81

## 2023-01-03 NOTE — Progress Notes (Signed)
Remote pacemaker transmission.   

## 2023-01-07 ENCOUNTER — Ambulatory Visit: Payer: Medicare HMO | Admitting: Surgery

## 2023-03-18 ENCOUNTER — Ambulatory Visit: Payer: Medicare HMO | Admitting: Surgery

## 2023-03-21 ENCOUNTER — Ambulatory Visit (INDEPENDENT_AMBULATORY_CARE_PROVIDER_SITE_OTHER): Payer: Self-pay

## 2023-03-21 DIAGNOSIS — I428 Other cardiomyopathies: Secondary | ICD-10-CM | POA: Diagnosis not present

## 2023-03-25 LAB — CUP PACEART REMOTE DEVICE CHECK
Battery Remaining Longevity: 25 mo
Battery Voltage: 2.92 V
Brady Statistic AP VP Percent: 33.66 %
Brady Statistic AP VS Percent: 0.02 %
Brady Statistic AS VP Percent: 66.28 %
Brady Statistic AS VS Percent: 0.04 %
Brady Statistic RA Percent Paced: 33.65 %
Brady Statistic RV Percent Paced: 99.9 %
Date Time Interrogation Session: 20250110192345
Implantable Lead Connection Status: 753985
Implantable Lead Connection Status: 753985
Implantable Lead Connection Status: 753985
Implantable Lead Implant Date: 20121112
Implantable Lead Implant Date: 20121112
Implantable Lead Implant Date: 20121112
Implantable Lead Location: 753858
Implantable Lead Location: 753859
Implantable Lead Location: 753860
Implantable Lead Model: 4194
Implantable Lead Model: 5076
Implantable Lead Model: 6947
Implantable Pulse Generator Implant Date: 20171114
Lead Channel Impedance Value: 323 Ohm
Lead Channel Impedance Value: 361 Ohm
Lead Channel Impedance Value: 399 Ohm
Lead Channel Impedance Value: 437 Ohm
Lead Channel Impedance Value: 494 Ohm
Lead Channel Impedance Value: 513 Ohm
Lead Channel Impedance Value: 551 Ohm
Lead Channel Impedance Value: 646 Ohm
Lead Channel Impedance Value: 760 Ohm
Lead Channel Pacing Threshold Amplitude: 0.5 V
Lead Channel Pacing Threshold Amplitude: 1 V
Lead Channel Pacing Threshold Pulse Width: 0.4 ms
Lead Channel Pacing Threshold Pulse Width: 0.4 ms
Lead Channel Sensing Intrinsic Amplitude: 3.375 mV
Lead Channel Sensing Intrinsic Amplitude: 3.375 mV
Lead Channel Sensing Intrinsic Amplitude: 9.25 mV
Lead Channel Sensing Intrinsic Amplitude: 9.25 mV
Lead Channel Setting Pacing Amplitude: 1.5 V
Lead Channel Setting Pacing Amplitude: 1.5 V
Lead Channel Setting Pacing Amplitude: 2 V
Lead Channel Setting Pacing Pulse Width: 0.4 ms
Lead Channel Setting Pacing Pulse Width: 1 ms
Lead Channel Setting Sensing Sensitivity: 2.8 mV
Zone Setting Status: 755011
Zone Setting Status: 755011

## 2023-04-12 ENCOUNTER — Ambulatory Visit (INDEPENDENT_AMBULATORY_CARE_PROVIDER_SITE_OTHER): Payer: Medicare PPO

## 2023-04-12 DIAGNOSIS — Z0000 Encounter for general adult medical examination without abnormal findings: Secondary | ICD-10-CM

## 2023-04-12 NOTE — Progress Notes (Signed)
Subjective:   KAYSIE MICHELINI is a 73 y.o. female who presents for Medicare Annual (Subsequent) preventive examination.  Visit Complete: Virtual I connected with  Miguel Dibble on 04/12/23 by a audio enabled telemedicine application and verified that I am speaking with the correct person using two identifiers.  Interactive audio and video telecommunications were attempted between this provider and patient, however failed, due to patient having technical difficulties OR patient did not have access to video capability.  We continued and completed visit with audio only.  Patient Location: Home  Provider Location: Home Office  I discussed the limitations of evaluation and management by telemedicine. The patient expressed understanding and agreed to proceed.  Vital Signs: Because this visit was a virtual/telehealth visit, some criteria may be missing or patient reported. Any vitals not documented were not able to be obtained and vitals that have been documented are patient reported.    Cardiac Risk Factors include: advanced age (>9men, >37 women);dyslipidemia;hypertension     Objective:    Today's Vitals   There is no height or weight on file to calculate BMI.     04/12/2023    3:23 PM 04/03/2022    5:56 AM 01/12/2020    2:51 PM 01/06/2019    4:07 PM 12/18/2017    8:34 AM 12/18/2016    8:31 AM 01/24/2016    6:14 AM  Advanced Directives  Does Patient Have a Medical Advance Directive? Yes Yes Yes Yes Yes Yes Yes  Type of Estate agent of [place];Living will Healthcare Power of [place];Living will Healthcare Power of [place];Living will Healthcare Power of [place];Living will Healthcare Power of [place];Living will Healthcare Power of [place];Living will   Does patient want to make changes to medical advance directive?  No - Patient declined No - Patient declined      Copy of Healthcare Power of Attorney in Chart? No - copy requested No - copy requested  No - copy requested No - copy requested No - copy requested No - copy requested No - copy requested    Current Medications (verified) Outpatient Encounter Medications as of 04/12/2023  Medication Sig   albuterol (VENTOLIN HFA) 108 (90 Base) MCG/ACT inhaler Inhale 2 puffs into the lungs every 6 (six) hours as needed for wheezing or shortness of breath.   alum & mag hydroxide-simeth (MAALOX MAX) 400-400-40 MG/5ML suspension Take 10 mLs by mouth every 6 (six) hours as needed for indigestion.   aspirin 81 MG EC tablet Take 81 mg by mouth daily.   carvedilol (COREG) 25 MG tablet Take 1 tablet (25 mg total) by mouth 2 (two) times daily with a meal.   Cholecalciferol 50 MCG (2000 UT) TABS 1 tab by mouth once daily   clopidogrel (PLAVIX) 75 MG tablet Take 1 tablet (75 mg total) by mouth daily.   furosemide (LASIX) 40 MG tablet TAKE 1 TABLET BY MOUTH DAILY   hydrocortisone cream 1 % Apply 1 application topically 3 (three) times daily as needed for itching.   levothyroxine (SYNTHROID) 100 MCG tablet Take 2 tablets (200 mcg total) by mouth daily.   levothyroxine (SYNTHROID) 75 MCG tablet Take 1 tablet (75 mcg total) by mouth daily.   lidocaine (XYLOCAINE) 2 % solution Use as directed 10 mLs in the mouth or throat every 6 (six) hours as needed (stomach pain).   methocarbamol (ROBAXIN) 500 MG tablet Take 2 tablets (1,000 mg total) by mouth at bedtime.   pantoprazole (PROTONIX) 40 MG tablet TAKE 1 TABLET(40  MG) BY MOUTH DAILY   spironolactone (ALDACTONE) 50 MG tablet Take 1 tablet (50 mg total) by mouth daily.   tiotropium (SPIRIVA) 18 MCG inhalation capsule Place 1 capsule (18 mcg total) into inhaler and inhale every morning.   triamcinolone (NASACORT) 55 MCG/ACT AERO nasal inhaler Place 1 spray into the nose daily.   umeclidinium bromide (INCRUSE ELLIPTA) 62.5 MCG/ACT AEPB Inhale 1 puff into the lungs daily.   fexofenadine (ALLEGRA) 180 MG tablet Take 180 mg by mouth daily. (Patient not taking: Reported on  04/12/2023)   losartan (COZAAR) 50 MG tablet Take 1 tablet (50 mg total) by mouth daily.   predniSONE (DELTASONE) 10 MG tablet 3 tabs by mouth per day for 3 days,2tabs per day for 3 days,1tab per day for 3 days (Patient not taking: Reported on 04/12/2023)   rosuvastatin (CRESTOR) 20 MG tablet Take 1 tablet (20 mg total) by mouth daily.   No facility-administered encounter medications on file as of 04/12/2023.    Allergies (verified) Ivp dye [iodinated contrast media], Latex, Penicillins, and Strawberry extract   History: Past Medical History:  Diagnosis Date   ALLERGIC RHINITIS 03/17/2008   Anemia, iron deficiency    Blood transfusion complicating pregnancy    after first child at 32 yo   CAD (coronary artery disease)    Nonobstructive by cardiac catheter 8/12:EF 10-15%, circumflex calcified without obstructive CAD, proximal LAD 20%, mid LAD 20%, proximal RCA 20%, mid RCA 20%.   Cardiomyopathy    a. remote h/o cath with reported nonobs CAD; b. echo 5/12: EF 15%, mild LVH, mild MR, LAE, mod pericardial effusion with increased filling pressures   Cervical disc disease    with recurrent radicular pain, Dr. Montez Morita   COPD (chronic obstructive pulmonary disease) (HCC) 06/14/2010   DJD (degenerative joint disease)    hands   Genital warts    hx   GOITER, MULTINODULAR 12/23/2009   Hyperlipidemia 11/23/2015   Hypertension    Hypothyroid    Myocardial infarction [facility])    Systolic CHF [facility])    Past Surgical History:  Procedure Laterality Date   ABDOMINAL AORTOGRAM W/LOWER EXTREMITY N/A 04/03/2022   Procedure: ABDOMINAL AORTOGRAM W/LOWER EXTREMITY;  Surgeon: Nada Libman, MD;  Location: [facility] INVASIVE CV LAB;  Service: Cardiovascular;  Laterality: N/A;   CARDIAC CATHETERIZATION  1995   with "timy blockage" [facility] Dr. Daisy Floro   CARDIAC DEFIBRILLATOR PLACEMENT  01-22-11   by Dr.Taylor   CHOLECYSTECTOMY     EP IMPLANTABLE DEVICE N/A 01/24/2016   Procedure: ICD Generator Changeout;   Surgeon: Marinus Maw, MD;  Location: [facility] INVASIVE CV LAB;  Service: Cardiovascular;  Laterality: N/A;   IMPLANTABLE CARDIOVERTER DEFIBRILLATOR IMPLANT N/A 01/22/2011   Procedure: IMPLANTABLE CARDIOVERTER DEFIBRILLATOR IMPLANT;  Surgeon: Marinus Maw, MD;  Location: [facility] CATH LAB;  Service: Cardiovascular;  Laterality: N/A;   PERIPHERAL VASCULAR BALLOON ANGIOPLASTY Left 04/03/2022   Procedure: PERIPHERAL VASCULAR BALLOON ANGIOPLASTY;  Surgeon: Nada Libman, MD;  Location: [facility] INVASIVE CV LAB;  Service: Cardiovascular;  Laterality: Left;  pop   Family History  Problem Relation Age of Onset   Stroke Mother    Heart disease Mother    Hypertension Mother    Diabetes Mother    Stroke Father    Heart disease Father    Hypertension Father    Diabetes Father    Cancer Sister        colon   Heart disease Sister        pacemaker  Hypertension Sister    Depression Sister    Diabetes Sister    Thyroid disease Sister        uncertain type   Transient ischemic attack Maternal Grandfather    Alcohol abuse Other    Malignant hyperthermia Other    Social History   Socioeconomic History   Marital status: Single    Spouse name: Not on file   Number of children: 3   Years of education: Not on file   Highest education level: Not on file  Occupational History   Occupation: Retired    Associate Professor: Advice worker [employer]  Tobacco Use   Smoking status: Former    Types: Cigarettes   Smokeless tobacco: Never  Vaping Use   Vaping status: Never Used  Substance and Sexual Activity   Alcohol use: No    Alcohol/week: 0.0 standard drinks of alcohol   Drug use: No   Sexual activity: Never  Other Topics Concern   Not on file  Social History Narrative   Not on file   Social Drivers of Health   Financial Resource Strain: Low Risk  (04/12/2023)   Overall Financial Resource Strain (CARDIA)    Difficulty of Paying Living Expenses: Not hard at all  Food Insecurity: No Food Insecurity (04/12/2023)    Hunger Vital Sign    Worried About Running Out of Food in the Last Year: Never true    Ran Out of Food in the Last Year: Never true  Transportation Needs: No Transportation Needs (04/12/2023)   PRAPARE - Administrator, Civil Service (Medical): No    Lack of Transportation (Non-Medical): No  Physical Activity: Sufficiently Active (04/12/2023)   Exercise Vital Sign    Days of Exercise per Week: 7 days    Minutes of Exercise per Session: 30 min  Stress: No Stress Concern Present (04/12/2023)   [employer] of Occupational Health - Occupational Stress Questionnaire    Feeling of Stress : Not at all  Social Connections: Moderately Isolated (04/12/2023)   Social Connection and Isolation Panel [NHANES]    Frequency of Communication with Friends and Family: More than three times a week    Frequency of Social Gatherings with Friends and Family: More than three times a week    Attends Religious Services: More than 4 times per year    Active Member of [employer] or Organizations: No    Attends Engineer, structural: Never    Marital Status: Never married    Tobacco Counseling Counseling given: Not Answered   Clinical Intake:  Pre-visit preparation completed: Yes  Pain : No/denies pain     Nutritional Risks: None Diabetes: No  How often do you need to have someone help you when you read instructions, pamphlets, or other written materials from your doctor or pharmacy?: 1 - Never  Interpreter Needed?: No  Information entered by :: NAllen LPN   Activities of Daily Living    04/12/2023    3:08 PM  In your present state of health, do you have any difficulty performing the following activities:  Hearing? 0  Vision? 0  Difficulty concentrating or making decisions? 0  Walking or climbing stairs? 0  Dressing or bathing? 0  Doing errands, shopping? 0  Preparing Food and eating ? N  Using the Toilet? N  In the past six months, have you accidently leaked urine? N   Do you have problems with loss of bowel control? N  Managing your Medications? N  Managing your  Finances? N  Housekeeping or managing your Housekeeping? N    Patient Care Team: Corwin Levins, MD as PCP - General Ladona Ridgel Doylene Canning, MD as PCP - Electrophysiology (Cardiology) Jens Som Madolyn Frieze, MD as PCP - Cardiology (Cardiology) Marinus Maw, MD as Consulting Physician (Cardiology) Dione Booze, Bertram Millard, MD as Consulting Physician (Ophthalmology) Jens Som Madolyn Frieze, MD as Consulting Physician (Cardiology)  Indicate any recent Medical Services you may have received from other than [facility] providers in the past year (date may be approximate).     Assessment:   This is a routine wellness examination for Bar[place].  Hearing/Vision screen Hearing Screening - Comments:: Denies hearing issues Vision Screening - Comments:: Regular eye exams, [facility]   Goals Addressed             This Visit's Progress    Patient Stated       04/12/2023, trying to get over fear of falling and get legs stronger       Depression Screen    04/12/2023    3:25 PM 12/11/2021   10:52 AM 12/11/2021   10:39 AM 06/08/2021   11:31 AM 04/13/2021    8:36 AM 04/13/2021    8:24 AM 01/12/2020    3:19 PM  PHQ 2/9 Scores  PHQ - 2 Score 0 0 0 0 1 0 0  PHQ- 9 Score 0  1        Fall Risk    04/12/2023    3:24 PM 04/11/2022    3:07 PM 12/11/2021   10:52 AM 12/11/2021   10:39 AM 06/08/2021   11:31 AM  Fall Risk   Falls in the past year? 0 1 0 0 0  Number falls in past yr: 0 0 0  0  Injury with Fall? 0 1 0  0  Risk for fall due to : Medication side effect;Impaired mobility;Impaired balance/gait History of fall(s)  No Fall Risks   Follow up Falls prevention discussed;Falls evaluation completed Falls evaluation completed  Follow up appointment     MEDICARE RISK AT HOME: Medicare Risk at Home Any stairs in or around the home?: Yes If so, are there any without handrails?: No Home free of loose throw rugs in  walkways, pet beds, electrical cords, etc?: Yes Adequate lighting in your home to reduce risk of falls?: Yes Life alert?: No Use of a cane, walker or w/c?: Yes Grab bars in the bathroom?: Yes Shower chair or bench in shower?: No Elevated toilet seat or a handicapped toilet?: Yes  TIMED UP AND GO:  Was the test performed?  No    Cognitive Function:    12/18/2016    8:38 AM  MMSE - Mini Mental State Exam  Orientation to time 5  Orientation to Place 5  Registration 3  Attention/ Calculation 5  Recall 2  Language- name 2 objects 2  Language- repeat 1  Language- follow 3 step command 3  Language- read & follow direction 1  Write a sentence 1  Copy design 1  Total score 29        04/12/2023    3:27 PM  6CIT Screen  What Year? 0 points  What month? 0 points  What time? 0 points  Count back from 20 0 points  Months in reverse 0 points  Repeat phrase 6 points  Total Score 6 points    Immunizations Immunization History  Administered Date(s) Administered   Fluad Quad(high Dose 65+) 01/29/2019, 12/08/2020   Influenza Split 12/12/2011  Influenza, High Dose Seasonal PF 12/04/2016, 12/10/2017   Influenza,inj,Quad PF,6+ Mos 11/23/2015   Moderna Sars-Covid-2 Vaccination 05/30/2019, 06/30/2019, 01/17/2020   Pneumococcal Conjugate-13 06/02/2014   Pneumococcal Polysaccharide-23 11/23/2015   Td 11/15/2009    TDAP status: Due, Education has been provided regarding the importance of this vaccine. Advised may receive this vaccine at local pharmacy or Health Dept. Aware to provide a copy of the vaccination record if obtained from local pharmacy or Health Dept. Verbalized acceptance and understanding.  Flu Vaccine status: Declined, Education has been provided regarding the importance of this vaccine but patient still declined. Advised may receive this vaccine at local pharmacy or Health Dept. Aware to provide a copy of the vaccination record if obtained from local pharmacy or Health  Dept. Verbalized acceptance and understanding.  Pneumococcal vaccine status: Up to date  Covid-19 vaccine status: Information provided on how to obtain vaccines.   Qualifies for Shingles Vaccine? Yes   Zostavax completed No   Shingrix Completed?: No.    Education has been provided regarding the importance of this vaccine. Patient has been advised to call insurance company to determine out of pocket expense if they have not yet received this vaccine. Advised may also receive vaccine at local pharmacy or Health Dept. Verbalized acceptance and understanding.  Screening Tests Health Maintenance  Topic Date Due   Zoster Vaccines- Shingrix (1 of 2) Never done   DTaP/Tdap/Td (2 - Tdap) 11/16/2019   COVID-19 Vaccine (4 - 2024-25 season) 11/11/2022   INFLUENZA VACCINE  06/10/2023 (Originally 10/11/2022)   Medicare Annual Wellness (AWV)  04/11/2024   Colonoscopy  11/07/2027   Pneumonia Vaccine 50+ Years old  Completed   DEXA SCAN  Completed   Hepatitis C Screening  Completed   HPV VACCINES  Aged Out    Health Maintenance  Health Maintenance Due  Topic Date Due   Zoster Vaccines- Shingrix (1 of 2) Never done   DTaP/Tdap/Td (2 - Tdap) 11/16/2019   COVID-19 Vaccine (4 - 2024-25 season) 11/11/2022    Colorectal cancer screening: Type of screening: Colonoscopy. Completed 11/06/2017. Repeat every 10 years  Mammogram status: patient will schedule  Bone Density status: Completed 12/18/2016.   Lung Cancer Screening: (Low Dose CT Chest recommended if Age 70-80 years, 20 pack-year currently smoking OR have quit w/in 15years.) does not qualify.   Lung Cancer Screening Referral: no  Additional Screening:  Hepatitis C Screening: does qualify; Completed 11/23/2015  Vision Screening: Recommended annual ophthalmology exams for early detection of glaucoma and other disorders of the eye. Is the patient up to date with their annual eye exam?  Yes  Who is the provider or what is the name of the office  in which the patient attends annual eye exams? [facility] If pt is not established with a provider, would they like to be referred to a provider to establish care? No .   Dental Screening: Recommended annual dental exams for proper oral hygiene  Diabetic Foot Exam: n/a  Community Resource Referral / Chronic Care Management: CRR required this visit?  No   CCM required this visit?  No     Plan:     I have personally reviewed and noted the following in the patient's chart:   Medical and social history Use of alcohol, tobacco or illicit drugs  Current medications and supplements including opioid prescriptions. Patient is not currently taking opioid prescriptions. Functional ability and status Nutritional status Physical activity Advanced directives List of other physicians Hospitalizations, surgeries, and ER  visits in previous 12 months Vitals Screenings to include cognitive, depression, and falls Referrals and appointments  In addition, I have reviewed and discussed with patient certain preventive protocols, quality metrics, and best practice recommendations. A written personalized care plan for preventive services as well as general preventive health recommendations were provided to patient.     Barb Merino, LPN   [id number]/61/0960   After Visit Summary: (Pick Up) Due to this being a telephonic visit, with patients personalized plan was offered to patient and patient has requested to Pick up at office.  Nurse Notes: none

## 2023-04-12 NOTE — Patient Instructions (Signed)
[name] , Thank you for taking time to come for your Medicare Wellness Visit. I appreciate your ongoing commitment to your health goals. Please review the following plan we discussed and let me know if I can assist you in the future.   Referrals/Orders/Follow-Ups/Clinician Recommendations: none  This is a list of the screening recommended for you and due dates:  Health Maintenance  Topic Date Due   Zoster (Shingles) Vaccine (1 of 2) Never done   DTaP/Tdap/Td vaccine (2 - Tdap) 11/16/2019   COVID-19 Vaccine (4 - 2024-25 season) 11/11/2022   Flu Shot  06/10/2023*   Medicare Annual Wellness Visit  04/11/2024   Colon Cancer Screening  11/07/2027   Pneumonia Vaccine  Completed   DEXA scan (bone density measurement)  Completed   Hepatitis C Screening  Completed   HPV Vaccine  Aged Out  *Topic was postponed. The date shown is not the original due date.    Advanced directives: (Copy Requested) Please bring a copy of your health care power of attorney and living will to the office to be added to your chart at your convenience.  Next Medicare Annual Wellness Visit scheduled for next year: Yes  insert Preventive Care attachment Insert FALL PREVENTION attachment if needed

## 2023-04-23 ENCOUNTER — Ambulatory Visit: Payer: Medicare PPO | Admitting: Internal Medicine

## 2023-04-30 NOTE — Progress Notes (Signed)
 Remote pacemaker transmission.

## 2023-04-30 NOTE — Addendum Note (Signed)
Addended by: Elease Etienne A on: 04/30/2023 03:07 PM   Modules accepted: Orders

## 2023-05-06 ENCOUNTER — Ambulatory Visit: Payer: Medicare HMO | Admitting: Surgery

## 2023-06-20 ENCOUNTER — Ambulatory Visit: Payer: Self-pay

## 2023-06-20 DIAGNOSIS — I428 Other cardiomyopathies: Secondary | ICD-10-CM

## 2023-06-23 LAB — CUP PACEART REMOTE DEVICE CHECK
Battery Remaining Longevity: 20 mo
Battery Voltage: 2.91 V
Brady Statistic AP VP Percent: 28.55 %
Brady Statistic AP VS Percent: 0.01 %
Brady Statistic AS VP Percent: 71.39 %
Brady Statistic AS VS Percent: 0.05 %
Brady Statistic RA Percent Paced: 28.49 %
Brady Statistic RV Percent Paced: 99.78 %
Date Time Interrogation Session: 20250411162346
Implantable Lead Connection Status: 753985
Implantable Lead Connection Status: 753985
Implantable Lead Connection Status: 753985
Implantable Lead Implant Date: 20121112
Implantable Lead Implant Date: 20121112
Implantable Lead Implant Date: 20121112
Implantable Lead Location: 753858
Implantable Lead Location: 753859
Implantable Lead Location: 753860
Implantable Lead Model: 4194
Implantable Lead Model: 5076
Implantable Lead Model: 6947
Implantable Pulse Generator Implant Date: 20171114
Lead Channel Impedance Value: 342 Ohm
Lead Channel Impedance Value: 361 Ohm
Lead Channel Impedance Value: 418 Ohm
Lead Channel Impedance Value: 437 Ohm
Lead Channel Impedance Value: 494 Ohm
Lead Channel Impedance Value: 532 Ohm
Lead Channel Impedance Value: 551 Ohm
Lead Channel Impedance Value: 646 Ohm
Lead Channel Impedance Value: 760 Ohm
Lead Channel Pacing Threshold Amplitude: 0.5 V
Lead Channel Pacing Threshold Amplitude: 0.875 V
Lead Channel Pacing Threshold Pulse Width: 0.4 ms
Lead Channel Pacing Threshold Pulse Width: 0.4 ms
Lead Channel Sensing Intrinsic Amplitude: 3.5 mV
Lead Channel Sensing Intrinsic Amplitude: 3.5 mV
Lead Channel Sensing Intrinsic Amplitude: 9.25 mV
Lead Channel Sensing Intrinsic Amplitude: 9.25 mV
Lead Channel Setting Pacing Amplitude: 1.5 V
Lead Channel Setting Pacing Amplitude: 1.5 V
Lead Channel Setting Pacing Amplitude: 2 V
Lead Channel Setting Pacing Pulse Width: 0.4 ms
Lead Channel Setting Pacing Pulse Width: 1 ms
Lead Channel Setting Sensing Sensitivity: 2.8 mV
Zone Setting Status: 755011
Zone Setting Status: 755011

## 2023-06-25 ENCOUNTER — Encounter: Payer: Self-pay | Admitting: Internal Medicine

## 2023-07-08 ENCOUNTER — Ambulatory Visit: Attending: Surgery | Admitting: Surgery

## 2023-07-16 NOTE — Progress Notes (Deleted)
 HPI: FU dilated cardiomyopathy. She was in the hospital in March 2012 with severe hypothyroidism and CHF. Followup echocardiogram in June 2012 demonstrated continued LV dysfunction with an EF of 15%. Cardiac catheterization was performed 10/25/10: EF 10-15%, circumflex calcified without obstructive CAD, proximal LAD 20%, mid LAD 20%, proximal RCA 20%, mid RCA 20%. She was felt to have mild CAD and a nonischemic cardiomyopathy. Also with h/o BIV ICD. Abdominal aortogram January 2024 showed no renal artery stenosis, diffusely diseased right SFA in the mild to moderate range and 50 to 60% diffuse disease in the left SFA; left popliteal artery occluded with reconstitution.  Patient had drug-coated balloon angioplasty of the popliteal artery.  Echocardiogram May 2024 showed normal LV function, mild RV dysfunction, mild mitral regurgitation, mild aortic insufficiency.  Lower extremity arterial Dopplers October 2024 showed 50 to 74% left SFA and total occlusion in the peroneal artery.  ABIs October 2024 mild on the right and left with abnormal toe-brachial index bilaterally.  Since I last saw her,   Current Outpatient Medications  Medication Sig Dispense Refill   albuterol  (VENTOLIN  HFA) 108 (90 Base) MCG/ACT inhaler Inhale 2 puffs into the lungs every 6 (six) hours as needed for wheezing or shortness of breath. 8 g 11   alum & mag hydroxide-simeth (MAALOX MAX) 400-400-40 MG/5ML suspension Take 10 mLs by mouth every 6 (six) hours as needed for indigestion. 355 mL 0   aspirin  81 MG EC tablet Take 81 mg by mouth daily.     carvedilol  (COREG ) 25 MG tablet Take 1 tablet (25 mg total) by mouth 2 (two) times daily with a meal. 180 tablet 3   Cholecalciferol  50 MCG (2000 UT) TABS 1 tab by mouth once daily 30 tablet 99   clopidogrel  (PLAVIX ) 75 MG tablet Take 1 tablet (75 mg total) by mouth daily. 90 tablet 11   fexofenadine (ALLEGRA) 180 MG tablet Take 180 mg by mouth daily. (Patient not taking: Reported on  04/12/2023)     furosemide  (LASIX ) 40 MG tablet TAKE 1 TABLET BY MOUTH DAILY 90 tablet 3   hydrocortisone cream 1 % Apply 1 application topically 3 (three) times daily as needed for itching.     levothyroxine  (SYNTHROID ) 100 MCG tablet Take 2 tablets (200 mcg total) by mouth daily. 180 tablet 3   levothyroxine  (SYNTHROID ) 75 MCG tablet Take 1 tablet (75 mcg total) by mouth daily. 90 tablet 3   lidocaine  (XYLOCAINE ) 2 % solution Use as directed 10 mLs in the mouth or throat every 6 (six) hours as needed (stomach pain). 100 mL 0   losartan  (COZAAR ) 50 MG tablet Take 1 tablet (50 mg total) by mouth daily. 90 tablet 3   methocarbamol  (ROBAXIN ) 500 MG tablet Take 2 tablets (1,000 mg total) by mouth at bedtime. 20 tablet 0   pantoprazole  (PROTONIX ) 40 MG tablet TAKE 1 TABLET(40 MG) BY MOUTH DAILY 90 tablet 3   predniSONE  (DELTASONE ) 10 MG tablet 3 tabs by mouth per day for 3 days,2tabs per day for 3 days,1tab per day for 3 days (Patient not taking: Reported on 04/12/2023) 18 tablet 0   rosuvastatin  (CRESTOR ) 20 MG tablet Take 1 tablet (20 mg total) by mouth daily. 90 tablet 3   spironolactone  (ALDACTONE ) 50 MG tablet Take 1 tablet (50 mg total) by mouth daily. 90 tablet 3   tiotropium (SPIRIVA ) 18 MCG inhalation capsule Place 1 capsule (18 mcg total) into inhaler and inhale every morning. 90 capsule 3   triamcinolone  (  NASACORT ) 55 MCG/ACT AERO nasal inhaler Place 1 spray into the nose daily. 1 Inhaler 11   umeclidinium bromide  (INCRUSE ELLIPTA ) 62.5 MCG/ACT AEPB Inhale 1 puff into the lungs daily. 1 each 11   No current facility-administered medications for this visit.     Past Medical History:  Diagnosis Date   ALLERGIC RHINITIS 03/17/2008   Anemia, iron deficiency    Blood transfusion complicating pregnancy    after first child at 82 yo   CAD (coronary artery disease)    Nonobstructive by cardiac catheter 8/12:EF 10-15%, circumflex calcified without obstructive CAD, proximal LAD 20%, mid LAD 20%,  proximal RCA 20%, mid RCA 20%.   Cardiomyopathy    a. remote h/o cath with reported nonobs CAD; b. echo 5/12: EF 15%, mild LVH, mild MR, LAE, mod pericardial effusion with increased filling pressures   Cervical disc disease    with recurrent radicular pain, Dr. [place] Hammans   COPD (chronic obstructive pulmonary disease) (HCC) 06/14/2010   DJD (degenerative joint disease)    hands   Genital warts    hx   GOITER, MULTINODULAR 12/23/2009   Hyperlipidemia 11/23/2015   Hypertension    Hypothyroid    Myocardial infarction [facility])    Systolic CHF [facility])     Past Surgical History:  Procedure Laterality Date   ABDOMINAL AORTOGRAM W/LOWER EXTREMITY N/A 04/03/2022   Procedure: ABDOMINAL AORTOGRAM W/LOWER EXTREMITY;  Surgeon: Margherita Shell, MD;  Location: [facility] INVASIVE CV LAB;  Service: Cardiovascular;  Laterality: N/A;   CARDIAC CATHETERIZATION  1995   with "timy blockage" [facility] Dr. Enoch Harter   CARDIAC DEFIBRILLATOR PLACEMENT  01-22-11   by Dr.Taylor   CHOLECYSTECTOMY     EP IMPLANTABLE DEVICE N/A 01/24/2016   Procedure: ICD Generator Changeout;  Surgeon: Tammie Fall, MD;  Location: [facility] INVASIVE CV LAB;  Service: Cardiovascular;  Laterality: N/A;   IMPLANTABLE CARDIOVERTER DEFIBRILLATOR IMPLANT N/A 01/22/2011   Procedure: IMPLANTABLE CARDIOVERTER DEFIBRILLATOR IMPLANT;  Surgeon: Tammie Fall, MD;  Location: [facility] CATH LAB;  Service: Cardiovascular;  Laterality: N/A;   PERIPHERAL VASCULAR BALLOON ANGIOPLASTY Left 04/03/2022   Procedure: PERIPHERAL VASCULAR BALLOON ANGIOPLASTY;  Surgeon: Margherita Shell, MD;  Location: [facility] INVASIVE CV LAB;  Service: Cardiovascular;  Laterality: Left;  pop    Social History   Socioeconomic History   Marital status: Single    Spouse name: Not on file   Number of children: 3   Years of education: Not on file   Highest education level: Not on file  Occupational History   Occupation: Retired    Associate Professor: Advice worker [employer]  Tobacco Use   Smoking status:  Former    Types: Cigarettes   Smokeless tobacco: Never  Vaping Use   Vaping status: Never Used  Substance and Sexual Activity   Alcohol use: No    Alcohol/week: 0.0 standard drinks of alcohol   Drug use: No   Sexual activity: Never  Other Topics Concern   Not on file  Social History Narrative   Not on file   Social Drivers of Health   Financial Resource Strain: Low Risk  (04/12/2023)   Overall Financial Resource Strain (CARDIA)    Difficulty of Paying Living Expenses: Not hard at all  Food Insecurity: No Food Insecurity (04/12/2023)   Hunger Vital Sign    Worried About Running Out of Food in the Last Year: Never true    Ran Out of Food in the Last Year: Never true  Transportation Needs: No Transportation Needs (04/12/2023)  PRAPARE - Administrator, Civil Service (Medical): No    Lack of Transportation (Non-Medical): No  Physical Activity: Sufficiently Active (04/12/2023)   Exercise Vital Sign    Days of Exercise per Week: 7 days    Minutes of Exercise per Session: 30 min  Stress: No Stress Concern Present (04/12/2023)   [employer] of Occupational Health - Occupational Stress Questionnaire    Feeling of Stress : Not at all  Social Connections: Moderately Isolated (04/12/2023)   Social Connection and Isolation Panel [NHANES]    Frequency of Communication with Friends and Family: More than three times a week    Frequency of Social Gatherings with Friends and Family: More than three times a week    Attends Religious Services: More than 4 times per year    Active Member of [employer] or Organizations: No    Attends Banker Meetings: Never    Marital Status: Never married  Intimate Partner Violence: Not At Risk (04/12/2023)   Humiliation, Afraid, Rape, and Kick questionnaire    Fear of Current or Ex-Partner: No    Emotionally Abused: No    Physically Abused: No    Sexually Abused: No    Family History  Problem Relation Age of Onset   Stroke Mother     Heart disease Mother    Hypertension Mother    Diabetes Mother    Stroke Father    Heart disease Father    Hypertension Father    Diabetes Father    Cancer Sister        colon   Heart disease Sister        pacemaker   Hypertension Sister    Depression Sister    Diabetes Sister    Thyroid  disease Sister        uncertain type   Transient ischemic attack Maternal Grandfather    Alcohol abuse Other    Malignant hyperthermia Other     ROS: no fevers or chills, productive cough, hemoptysis, dysphasia, odynophagia, melena, hematochezia, dysuria, hematuria, rash, seizure activity, orthopnea, PND, pedal edema, claudication. Remaining systems are negative.  Physical Exam: Well-developed well-nourished in no acute distress.  Skin is warm and dry.  HEENT is normal.  Neck is supple.  Chest is clear to auscultation with normal expansion.  Cardiovascular exam is regular rate and rhythm.  Abdominal exam nontender or distended. No masses palpated. Extremities show no edema. neuro grossly intact  ECG- personally reviewed  A/P  1 nonischemic cardiomyopathy-LV function has improved on most recent echocardiogram.  Continue ARB and beta-blocker.  2 hypertension-patient's blood pressure is controlled.  Continue present medical regimen.  3 hyperlipidemia-continue statin.  4 chronic combined systolic/diastolic congestive heart failure-she is euvolemic.  Continue diuretic at present dose.  5 biventricular ICD-managed by electrophysiology.  6 coronary artery disease-continue aspirin  and statin.  7 peripheral vascular disease-continue medical therapy.  She is followed by vascular surgery.  Alexandria Angel, MD

## 2023-07-29 NOTE — Addendum Note (Signed)
 Addended by: Lott Rouleau A on: 07/29/2023 02:02 PM   Modules accepted: Orders

## 2023-07-29 NOTE — Progress Notes (Signed)
 Remote pacemaker transmission.

## 2023-07-30 ENCOUNTER — Ambulatory Visit: Payer: Medicare PPO | Attending: Cardiology | Admitting: Cardiology

## 2023-07-31 ENCOUNTER — Encounter: Payer: Self-pay | Admitting: Cardiology

## 2023-08-01 ENCOUNTER — Ambulatory Visit: Admitting: Internal Medicine

## 2023-08-12 ENCOUNTER — Encounter: Payer: Self-pay | Admitting: Internal Medicine

## 2023-08-12 ENCOUNTER — Ambulatory Visit (INDEPENDENT_AMBULATORY_CARE_PROVIDER_SITE_OTHER): Admitting: Internal Medicine

## 2023-08-12 VITALS — BP 124/76 | HR 70 | Temp 98.7°F | Ht 65.0 in | Wt 171.0 lb

## 2023-08-12 DIAGNOSIS — D500 Iron deficiency anemia secondary to blood loss (chronic): Secondary | ICD-10-CM | POA: Diagnosis not present

## 2023-08-12 DIAGNOSIS — E559 Vitamin D deficiency, unspecified: Secondary | ICD-10-CM | POA: Diagnosis not present

## 2023-08-12 DIAGNOSIS — E039 Hypothyroidism, unspecified: Secondary | ICD-10-CM | POA: Diagnosis not present

## 2023-08-12 DIAGNOSIS — I10 Essential (primary) hypertension: Secondary | ICD-10-CM

## 2023-08-12 DIAGNOSIS — E538 Deficiency of other specified B group vitamins: Secondary | ICD-10-CM | POA: Diagnosis not present

## 2023-08-12 DIAGNOSIS — Z0000 Encounter for general adult medical examination without abnormal findings: Secondary | ICD-10-CM

## 2023-08-12 DIAGNOSIS — N1831 Chronic kidney disease, stage 3a: Secondary | ICD-10-CM | POA: Diagnosis not present

## 2023-08-12 DIAGNOSIS — Z0001 Encounter for general adult medical examination with abnormal findings: Secondary | ICD-10-CM

## 2023-08-12 DIAGNOSIS — R739 Hyperglycemia, unspecified: Secondary | ICD-10-CM | POA: Diagnosis not present

## 2023-08-12 DIAGNOSIS — I428 Other cardiomyopathies: Secondary | ICD-10-CM | POA: Diagnosis not present

## 2023-08-12 DIAGNOSIS — I429 Cardiomyopathy, unspecified: Secondary | ICD-10-CM | POA: Diagnosis not present

## 2023-08-12 DIAGNOSIS — E7800 Pure hypercholesterolemia, unspecified: Secondary | ICD-10-CM | POA: Diagnosis not present

## 2023-08-12 LAB — CBC WITH DIFFERENTIAL/PLATELET
Basophils Absolute: 0 10*3/uL (ref 0.0–0.1)
Basophils Relative: 1.2 % (ref 0.0–3.0)
Eosinophils Absolute: 0.2 10*3/uL (ref 0.0–0.7)
Eosinophils Relative: 4.3 % (ref 0.0–5.0)
HCT: 39.7 % (ref 36.0–46.0)
Hemoglobin: 12.9 g/dL (ref 12.0–15.0)
Lymphocytes Relative: 31.3 % (ref 12.0–46.0)
Lymphs Abs: 1.2 10*3/uL (ref 0.7–4.0)
MCHC: 32.6 g/dL (ref 30.0–36.0)
MCV: 83.1 fl (ref 78.0–100.0)
Monocytes Absolute: 0.3 10*3/uL (ref 0.1–1.0)
Monocytes Relative: 7.6 % (ref 3.0–12.0)
Neutro Abs: 2.1 10*3/uL (ref 1.4–7.7)
Neutrophils Relative %: 55.6 % (ref 43.0–77.0)
Platelets: 266 10*3/uL (ref 150.0–400.0)
RBC: 4.77 Mil/uL (ref 3.87–5.11)
RDW: 14.6 % (ref 11.5–15.5)
WBC: 3.8 10*3/uL — ABNORMAL LOW (ref 4.0–10.5)

## 2023-08-12 LAB — VITAMIN D 25 HYDROXY (VIT D DEFICIENCY, FRACTURES): VITD: 19.3 ng/mL — ABNORMAL LOW (ref 30.00–100.00)

## 2023-08-12 LAB — BASIC METABOLIC PANEL WITH GFR
BUN: 10 mg/dL (ref 6–23)
CO2: 25 meq/L (ref 19–32)
Calcium: 9.9 mg/dL (ref 8.4–10.5)
Chloride: 104 meq/L (ref 96–112)
Creatinine, Ser: 1.32 mg/dL — ABNORMAL HIGH (ref 0.40–1.20)
GFR: 39.68 mL/min — ABNORMAL LOW (ref >60.00)
Glucose, Bld: 86 mg/dL (ref 70–99)
Potassium: 3.9 meq/L (ref 3.5–5.1)
Sodium: 139 meq/L (ref 135–145)

## 2023-08-12 LAB — LIPID PANEL
Cholesterol: 164 mg/dL (ref 0–200)
HDL: 37.5 mg/dL — ABNORMAL LOW (ref >39.00)
LDL Cholesterol: 98 mg/dL (ref 0–99)
NonHDL: 126.8
Total CHOL/HDL Ratio: 4
Triglycerides: 142 mg/dL (ref 0.0–149.0)
VLDL: 28.4 mg/dL (ref 0.0–40.0)

## 2023-08-12 LAB — HEPATIC FUNCTION PANEL
ALT: 9 U/L (ref 0–35)
AST: 12 U/L (ref 0–37)
Albumin: 4.2 g/dL (ref 3.5–5.2)
Alkaline Phosphatase: 112 U/L (ref 39–117)
Bilirubin, Direct: 0.1 mg/dL (ref 0.0–0.3)
Total Bilirubin: 0.4 mg/dL (ref 0.2–1.2)
Total Protein: 8.1 g/dL (ref 6.0–8.3)

## 2023-08-12 LAB — URINALYSIS, ROUTINE W REFLEX MICROSCOPIC
Bilirubin Urine: NEGATIVE
Ketones, ur: NEGATIVE
Leukocytes,Ua: NEGATIVE
Nitrite: NEGATIVE
Specific Gravity, Urine: 1.01 (ref 1.000–1.030)
Total Protein, Urine: NEGATIVE
Urine Glucose: NEGATIVE
Urobilinogen, UA: 0.2 (ref 0.0–1.0)
pH: 6 (ref 5.0–8.0)

## 2023-08-12 LAB — VITAMIN B12: Vitamin B-12: 209 pg/mL — ABNORMAL LOW (ref 211–911)

## 2023-08-12 LAB — TSH: TSH: 28.94 u[IU]/mL — ABNORMAL HIGH (ref 0.35–5.50)

## 2023-08-12 LAB — HEMOGLOBIN A1C: Hgb A1c MFr Bld: 6.6 % — ABNORMAL HIGH (ref 4.6–6.5)

## 2023-08-12 MED ORDER — CLOPIDOGREL BISULFATE 75 MG PO TABS: 75.0000 mg | ORAL_TABLET | Freq: Every day | ORAL | 3 refills | Status: AC

## 2023-08-12 MED ORDER — TRIAMCINOLONE ACETONIDE 55 MCG/ACT NA AERO: 1.0000 | INHALATION_SPRAY | Freq: Every day | NASAL | 11 refills | Status: AC

## 2023-08-12 MED ORDER — METHOCARBAMOL 500 MG PO TABS: 1000.0000 mg | ORAL_TABLET | Freq: Every day | ORAL | 5 refills | Status: AC

## 2023-08-12 MED ORDER — LEVOTHYROXINE SODIUM 75 MCG PO TABS: 75.0000 ug | ORAL_TABLET | Freq: Every day | ORAL | 3 refills | Status: AC

## 2023-08-12 MED ORDER — LOSARTAN POTASSIUM 50 MG PO TABS: 50.0000 mg | ORAL_TABLET | Freq: Every day | ORAL | 3 refills | Status: AC

## 2023-08-12 MED ORDER — SPIRONOLACTONE 50 MG PO TABS: 50.0000 mg | ORAL_TABLET | Freq: Every day | ORAL | 3 refills | Status: AC

## 2023-08-12 MED ORDER — ROSUVASTATIN CALCIUM 20 MG PO TABS
20.0000 mg | ORAL_TABLET | Freq: Every day | ORAL | 3 refills | Status: AC
Start: 2023-08-12 — End: 2023-11-10

## 2023-08-12 MED ORDER — LEVOTHYROXINE SODIUM 100 MCG PO TABS: 200.0000 ug | ORAL_TABLET | Freq: Every day | ORAL | 3 refills | Status: AC

## 2023-08-12 NOTE — Assessment & Plan Note (Signed)
 Last vitamin D  Lab Results  Component Value Date   VD25OH 29.68 (L) 12/11/2021   Low, to start oral replacement

## 2023-08-12 NOTE — Assessment & Plan Note (Signed)
 Lab Results  Component Value Date   HGBA1C 6.9 (H) 12/11/2021   Stable, pt to continue current medical treatment  - diet, wt control

## 2023-08-12 NOTE — Assessment & Plan Note (Signed)

## 2023-08-12 NOTE — Assessment & Plan Note (Signed)
 Lab Results  Component Value Date   LDLCALC 60 12/11/2021   Stable, pt to continue current statin crestor  20 qd

## 2023-08-12 NOTE — Patient Instructions (Signed)
 Please continue all other medications as before, and refills have been done  Please have the pharmacy call with any other refills you may need.  Please continue your efforts at being more active, low cholesterol diet, and weight control.  You are otherwise up to date with prevention measures today.  Please keep your appointments with your specialists as you may have planned  Please go to the LAB at the blood drawing area for the tests to be done  You will be contacted by phone if any changes need to be made immediately.  Otherwise, you will receive a letter about your results with an explanation, but please check with MyChart first.  Please make an Appointment to return in 6 months, or sooner if needed

## 2023-08-12 NOTE — Progress Notes (Signed)
 Patient ID: [name], female   DOB: 1950/09/30, 73 y.o.   MRN: [id number]         Chief Complaint:: wellness exam and iron def anemia, low thryoid, hld, ckd3a, hyperglycemia, low vit d       HPI:  [name] is a 73 y.o. female here for wellness exam; for tdap and shingrix at pharmacy, o/w up to date                        Also Pt denies chest pain, increased sob or doe, wheezing, orthopnea, PND, increased LE swelling, palpitations, dizziness or syncope.   Pt denies polydipsia, polyuria, or new focal neuro s/s.    Pt denies fever, wt loss, night sweats, loss of appetite, or other constitutional symptoms     Wt Readings from Last 3 Encounters:  08/12/23 171 lb (77.6 kg)  09/24/22 175 lb 14.4 oz (79.8 kg)  06/19/22 179 lb 9.6 oz (81.5 kg)   BP Readings from Last 3 Encounters:  08/12/23 124/76  09/24/22 (!) 150/83  06/19/22 (!) 140/78   Immunization History  Administered Date(s) Administered   Fluad Quad(high Dose 65+) 01/29/2019, 12/08/2020   Influenza Split 12/12/2011   Influenza, High Dose Seasonal PF 12/04/2016, 12/10/2017   Influenza,inj,Quad PF,6+ Mos 11/23/2015   Moderna Sars-Covid-2 Vaccination 05/30/2019, 06/30/2019, 01/17/2020   Pneumococcal Conjugate-13 06/02/2014   Pneumococcal Polysaccharide-23 11/23/2015   Td 11/15/2009   Health Maintenance Due  Topic Date Due   Zoster Vaccines- Shingrix (1 of 2) Never done   DTaP/Tdap/Td (2 - Tdap) 11/16/2019      Past Medical History:  Diagnosis Date   ALLERGIC RHINITIS 03/17/2008   Anemia, iron deficiency    Blood transfusion complicating pregnancy    after first child at 50 yo   CAD (coronary artery disease)    Nonobstructive by cardiac catheter 8/12:EF 10-15%, circumflex calcified without obstructive CAD, proximal LAD 20%, mid LAD 20%, proximal RCA 20%, mid RCA 20%.   Cardiomyopathy    a. remote h/o cath with reported nonobs CAD; b. echo 5/12: EF 15%, mild LVH, mild MR, LAE, mod pericardial effusion with  increased filling pressures   Cervical disc disease    with recurrent radicular pain, Dr. [place] Hammans   COPD (chronic obstructive pulmonary disease) (HCC) 06/14/2010   DJD (degenerative joint disease)    hands   Genital warts    hx   GOITER, MULTINODULAR 12/23/2009   Hyperlipidemia 11/23/2015   Hypertension    Hypothyroid    Myocardial infarction [facility])    Systolic CHF [facility])    Past Surgical History:  Procedure Laterality Date   ABDOMINAL AORTOGRAM W/LOWER EXTREMITY N/A 04/03/2022   Procedure: ABDOMINAL AORTOGRAM W/LOWER EXTREMITY;  Surgeon: Margherita Shell, MD;  Location: [facility] INVASIVE CV LAB;  Service: Cardiovascular;  Laterality: N/A;   CARDIAC CATHETERIZATION  1995   with "timy blockage" [facility] Dr. Enoch Harter   CARDIAC DEFIBRILLATOR PLACEMENT  01-22-11   by Dr.Taylor   CHOLECYSTECTOMY     EP IMPLANTABLE DEVICE N/A 01/24/2016   Procedure: ICD Generator Changeout;  Surgeon: Tammie Fall, MD;  Location: [facility] INVASIVE CV LAB;  Service: Cardiovascular;  Laterality: N/A;   IMPLANTABLE CARDIOVERTER DEFIBRILLATOR IMPLANT N/A 01/22/2011   Procedure: IMPLANTABLE CARDIOVERTER DEFIBRILLATOR IMPLANT;  Surgeon: Tammie Fall, MD;  Location: [facility] CATH LAB;  Service: Cardiovascular;  Laterality: N/A;   PERIPHERAL VASCULAR BALLOON ANGIOPLASTY Left 04/03/2022   Procedure: PERIPHERAL VASCULAR BALLOON ANGIOPLASTY;  Surgeon: Charlotte Cookey,  Josetta Niece, MD;  Location: [facility] INVASIVE CV LAB;  Service: Cardiovascular;  Laterality: Left;  pop    reports that she has quit smoking. Her smoking use included cigarettes. She has never used smokeless tobacco. She reports that she does not drink alcohol and does not use drugs. family history includes Alcohol abuse in an other family member; Cancer in her sister; Depression in her sister; Diabetes in her father, mother, and sister; Heart disease in her father, mother, and sister; Hypertension in her father, mother, and sister; Malignant hyperthermia in an other family member; Stroke  in her father and mother; Thyroid  disease in her sister; Transient ischemic attack in her maternal grandfather. Allergies  Allergen Reactions   Ivp Dye [Iodinated Contrast Media] Itching and Rash    rash, itching & peel (patient has a MILDER reaction when given w/benadryl )   Latex Rash    Allergic to latex gloves   Penicillins Itching, Swelling, Rash and Other (See Comments)    Has patient had a PCN reaction causing immediate rash, facial/tongue/throat swelling, SOB or lightheadedness with hypotension:Yes Has patient had a PCN reaction causing severe rash involving mucus membranes or skin necrosis:Yes Has patient had a PCN reaction that required hospitalization:Pt. Was inpatient when reaction occurred Has patient had a PCN reaction occurring within the last 10 years: Yes If all of the above answers are "NO", then may proceed with Cephalosporin use.    Strawberry Extract Swelling and Rash        Current Outpatient Medications on File Prior to Visit  Medication Sig Dispense Refill   albuterol  (VENTOLIN  HFA) 108 (90 Base) MCG/ACT inhaler Inhale 2 puffs into the lungs every 6 (six) hours as needed for wheezing or shortness of breath. 8 g 11   alum & mag hydroxide-simeth (MAALOX MAX) 400-400-40 MG/5ML suspension Take 10 mLs by mouth every 6 (six) hours as needed for indigestion. 355 mL 0   aspirin  81 MG EC tablet Take 81 mg by mouth daily.     carvedilol  (COREG ) 25 MG tablet Take 1 tablet (25 mg total) by mouth 2 (two) times daily with a meal. 180 tablet 3   Cholecalciferol  50 MCG (2000 UT) TABS 1 tab by mouth once daily 30 tablet 99   fexofenadine (ALLEGRA) 180 MG tablet Take 180 mg by mouth daily.     furosemide  (LASIX ) 40 MG tablet TAKE 1 TABLET BY MOUTH DAILY 90 tablet 3   hydrocortisone cream 1 % Apply 1 application topically 3 (three) times daily as needed for itching.     lidocaine  (XYLOCAINE ) 2 % solution Use as directed 10 mLs in the mouth or throat every 6 (six) hours as needed  (stomach pain). 100 mL 0   pantoprazole  (PROTONIX ) 40 MG tablet TAKE 1 TABLET(40 MG) BY MOUTH DAILY 90 tablet 3   predniSONE  (DELTASONE ) 10 MG tablet 3 tabs by mouth per day for 3 days,2tabs per day for 3 days,1tab per day for 3 days 18 tablet 0   tiotropium (SPIRIVA ) 18 MCG inhalation capsule Place 1 capsule (18 mcg total) into inhaler and inhale every morning. 90 capsule 3   umeclidinium bromide  (INCRUSE ELLIPTA ) 62.5 MCG/ACT AEPB Inhale 1 puff into the lungs daily. 1 each 11   No current facility-administered medications on file prior to visit.        ROS:  All others reviewed and negative.  Objective        PE:  BP 124/76 (BP Location: Right Arm, Patient Position: Sitting, Cuff Size: Normal)  Pulse 70   Temp 98.7 F (37.1 C) (Oral)   Ht 5\' 5"  (1.651 m)   Wt 171 lb (77.6 kg)   SpO2 97%   BMI 28.46 kg/m                 Constitutional: Pt appears in NAD               HENT: Head: NCAT.                Right Ear: External ear normal.                 Left Ear: External ear normal.                Eyes: . Pupils are equal, round, and reactive to light. Conjunctivae and EOM are normal               Nose: without d/c or deformity               Neck: Neck supple. Gross normal ROM               Cardiovascular: Normal rate and regular rhythm.                 Pulmonary/Chest: Effort normal and breath sounds without rales or wheezing.                Abd:  Soft, NT, ND, + BS, no organomegaly               Neurological: Pt is alert. At baseline orientation, motor grossly intact               Skin: Skin is warm. No rashes, no other new lesions, LE edema - none               Psychiatric: Pt behavior is normal without agitation   Micro: none  Cardiac tracings I have personally interpreted today:  none  Pertinent Radiological findings (summarize): none   Lab Results  Component Value Date   WBC 5.0 12/14/2021   HGB 12.6 04/03/2022   HCT 37.0 04/03/2022   PLT 302 12/14/2021   GLUCOSE 109  (H) 04/03/2022   CHOL 117 12/11/2021   TRIG 130.0 12/11/2021   HDL 31.30 (L) 12/11/2021   LDLDIRECT 92.0 04/24/2017   LDLCALC 60 12/11/2021   ALT 18 12/14/2021   AST 23 12/14/2021   NA 141 04/03/2022   K 3.8 04/03/2022   CL 105 04/03/2022   CREATININE 1.30 (H) 04/03/2022   BUN 16 04/03/2022   CO2 27 12/14/2021   TSH 0.68 12/11/2021   INR 1.1 12/14/2021   HGBA1C 6.9 (H) 12/11/2021   MICROALBUR 0.8 04/13/2021   Assessment/Plan:  [name] is a 73 y.o. Black or African American [2] female with  has a past medical history of ALLERGIC RHINITIS (03/17/2008), Anemia, iron deficiency, Blood transfusion complicating pregnancy, CAD (coronary artery disease), Cardiomyopathy, Cervical disc disease, COPD (chronic obstructive pulmonary disease) (HCC) (06/14/2010), DJD (degenerative joint disease), Genital warts, GOITER, MULTINODULAR (12/23/2009), Hyperlipidemia (11/23/2015), Hypertension, Hypothyroid, Myocardial infarction (HCC), and Systolic CHF (HCC).  Encounter for well adult exam with abnormal findings Age and sex appropriate education and counseling updated with regular exercise and diet Referrals for preventative services - none needed Immunizations addressed - for tdap and shingrix at pharmacy Smoking counseling  - none needed Evidence for depression or other mood disorder - none significant Most recent labs reviewed. I have personally reviewed and  have noted: 1) the patient's medical and social history 2) The patient's current medications and supplements 3) The patient's height, weight, and BMI have been recorded in the chart   Iron deficiency anemia Also for iron labs, no overt bleeding  Essential hypertension BP Readings from Last 3 Encounters:  08/12/23 124/76  09/24/22 (!) 150/83  06/19/22 (!) 140/78   Stable, pt to continue medical treatment coreg  25 bid, losartan  50 qd   Hypothyroidism Lab Results  Component Value Date   TSH 0.68 12/11/2021   Stable, pt to  continue levothyroxine  total 275 mcg every day - for f/u lab today   Hyperlipidemia Lab Results  Component Value Date   LDLCALC 60 12/11/2021   Stable, pt to continue current statin crestor  20 qd   CKD (chronic kidney disease) Lab Results  Component Value Date   CREATININE 1.30 (H) 04/03/2022   Stable overall, cont to avoid nephrotoxins   Hyperglycemia Lab Results  Component Value Date   HGBA1C 6.9 (H) 12/11/2021   Stable, pt to continue current medical treatment  - diet, wt control   Vitamin D  deficiency Last vitamin D  Lab Results  Component Value Date   VD25OH 29.68 (L) 12/11/2021   Low, to start oral replacement  Followup: Return in about 6 months (around 02/11/2024).  Rosalia Colonel, MD 08/12/2023 12:52 PM [facility]

## 2023-08-12 NOTE — Assessment & Plan Note (Signed)
 Also for iron labs, no overt bleeding

## 2023-08-12 NOTE — Assessment & Plan Note (Signed)
Lab Results  Component Value Date   CREATININE 1.30 (H) 04/03/2022   Stable overall, cont to avoid nephrotoxins

## 2023-08-12 NOTE — Assessment & Plan Note (Signed)
 BP Readings from Last 3 Encounters:  08/12/23 124/76  09/24/22 (!) 150/83  06/19/22 (!) 140/78   Stable, pt to continue medical treatment coreg  25 bid, losartan  50 qd

## 2023-08-12 NOTE — Assessment & Plan Note (Signed)
 Lab Results  Component Value Date   TSH 0.68 12/11/2021   Stable, pt to continue levothyroxine  total 275 mcg every day - for f/u lab today

## 2023-08-13 ENCOUNTER — Ambulatory Visit: Payer: Self-pay | Admitting: Internal Medicine

## 2023-09-19 ENCOUNTER — Ambulatory Visit: Payer: Self-pay

## 2023-09-19 DIAGNOSIS — I428 Other cardiomyopathies: Secondary | ICD-10-CM

## 2023-09-24 LAB — CUP PACEART REMOTE DEVICE CHECK
Battery Remaining Longevity: 19 mo
Battery Voltage: 2.9 V
Brady Statistic AP VP Percent: 10.44 %
Brady Statistic AP VS Percent: 0.01 %
Brady Statistic AS VP Percent: 89.49 %
Brady Statistic AS VS Percent: 0.07 %
Brady Statistic RA Percent Paced: 10.43 %
Brady Statistic RV Percent Paced: 99.78 %
Date Time Interrogation Session: 20250715174957
Implantable Lead Connection Status: 753985
Implantable Lead Connection Status: 753985
Implantable Lead Connection Status: 753985
Implantable Lead Implant Date: 20121112
Implantable Lead Implant Date: 20121112
Implantable Lead Implant Date: 20121112
Implantable Lead Location: 753858
Implantable Lead Location: 753859
Implantable Lead Location: 753860
Implantable Lead Model: 4194
Implantable Lead Model: 5076
Implantable Lead Model: 6947
Implantable Pulse Generator Implant Date: 20171114
Lead Channel Impedance Value: 342 Ohm
Lead Channel Impedance Value: 380 Ohm
Lead Channel Impedance Value: 418 Ohm
Lead Channel Impedance Value: 418 Ohm
Lead Channel Impedance Value: 456 Ohm
Lead Channel Impedance Value: 513 Ohm
Lead Channel Impedance Value: 589 Ohm
Lead Channel Impedance Value: 684 Ohm
Lead Channel Impedance Value: 817 Ohm
Lead Channel Pacing Threshold Amplitude: 0.5 V
Lead Channel Pacing Threshold Amplitude: 0.75 V
Lead Channel Pacing Threshold Pulse Width: 0.4 ms
Lead Channel Pacing Threshold Pulse Width: 0.4 ms
Lead Channel Sensing Intrinsic Amplitude: 3.75 mV
Lead Channel Sensing Intrinsic Amplitude: 3.75 mV
Lead Channel Sensing Intrinsic Amplitude: 9 mV
Lead Channel Sensing Intrinsic Amplitude: 9 mV
Lead Channel Setting Pacing Amplitude: 1.5 V
Lead Channel Setting Pacing Amplitude: 1.5 V
Lead Channel Setting Pacing Amplitude: 2 V
Lead Channel Setting Pacing Pulse Width: 0.4 ms
Lead Channel Setting Pacing Pulse Width: 1 ms
Lead Channel Setting Sensing Sensitivity: 2.8 mV
Zone Setting Status: 755011
Zone Setting Status: 755011

## 2023-09-25 ENCOUNTER — Ambulatory Visit: Payer: Self-pay | Admitting: Internal Medicine

## 2023-12-20 NOTE — Progress Notes (Signed)
 Remote PPM Transmission

## 2023-12-24 ENCOUNTER — Ambulatory Visit

## 2023-12-24 DIAGNOSIS — I428 Other cardiomyopathies: Secondary | ICD-10-CM

## 2023-12-26 ENCOUNTER — Ambulatory Visit: Payer: Self-pay | Admitting: Internal Medicine

## 2023-12-26 LAB — CUP PACEART REMOTE DEVICE CHECK
Battery Remaining Longevity: 15 mo
Battery Voltage: 2.88 V
Brady Statistic AP VP Percent: 12.35 %
Brady Statistic AP VS Percent: 0.01 %
Brady Statistic AS VP Percent: 87.55 %
Brady Statistic AS VS Percent: 0.09 %
Brady Statistic RA Percent Paced: 12.34 %
Brady Statistic RV Percent Paced: 99.79 %
Date Time Interrogation Session: 20251016155932
Implantable Lead Connection Status: 753985
Implantable Lead Connection Status: 753985
Implantable Lead Connection Status: 753985
Implantable Lead Implant Date: 20121112
Implantable Lead Implant Date: 20121112
Implantable Lead Implant Date: 20121112
Implantable Lead Location: 753858
Implantable Lead Location: 753859
Implantable Lead Location: 753860
Implantable Lead Model: 4194
Implantable Lead Model: 5076
Implantable Lead Model: 6947
Implantable Pulse Generator Implant Date: 20171114
Lead Channel Impedance Value: 361 Ohm
Lead Channel Impedance Value: 380 Ohm
Lead Channel Impedance Value: 437 Ohm
Lead Channel Impedance Value: 456 Ohm
Lead Channel Impedance Value: 494 Ohm
Lead Channel Impedance Value: 551 Ohm
Lead Channel Impedance Value: 608 Ohm
Lead Channel Impedance Value: 722 Ohm
Lead Channel Impedance Value: 855 Ohm
Lead Channel Pacing Threshold Amplitude: 0.5 V
Lead Channel Pacing Threshold Amplitude: 0.75 V
Lead Channel Pacing Threshold Pulse Width: 0.4 ms
Lead Channel Pacing Threshold Pulse Width: 0.4 ms
Lead Channel Sensing Intrinsic Amplitude: 22.625 mV
Lead Channel Sensing Intrinsic Amplitude: 22.625 mV
Lead Channel Sensing Intrinsic Amplitude: 4.375 mV
Lead Channel Sensing Intrinsic Amplitude: 4.375 mV
Lead Channel Setting Pacing Amplitude: 1.5 V
Lead Channel Setting Pacing Amplitude: 1.5 V
Lead Channel Setting Pacing Amplitude: 2 V
Lead Channel Setting Pacing Pulse Width: 0.4 ms
Lead Channel Setting Pacing Pulse Width: 1 ms
Lead Channel Setting Sensing Sensitivity: 2.8 mV
Zone Setting Status: 755011
Zone Setting Status: 755011

## 2023-12-26 NOTE — Progress Notes (Signed)
 Remote PPM Transmission

## 2023-12-27 ENCOUNTER — Encounter: Payer: Self-pay | Admitting: *Deleted

## 2023-12-27 NOTE — Progress Notes (Signed)
 [name]                                          MRN: [id number]   12/27/2023   The [facility] Quality Team Specialist reviewed this patient medical record for the purposes of chart review for care gap closure. The following were reviewed: abstraction for care gap closure-controlling blood pressure.    [facility]I Quality Team

## 2024-02-12 ENCOUNTER — Ambulatory Visit: Admitting: Internal Medicine

## 2024-02-19 ENCOUNTER — Ambulatory Visit: Admitting: Internal Medicine

## 2024-03-06 NOTE — Progress Notes (Signed)
 [name]                                          MRN: [id number]   03/06/2024   The [facility] Quality Team Specialist reviewed this patient medical record for the purposes of chart review for care gap closure. The following were reviewed: abstraction for care gap closure-controlling blood pressure.    [facility]BCI Quality Team

## 2024-03-24 ENCOUNTER — Encounter

## 2024-04-07 NOTE — Progress Notes (Signed)
 [name]                                          MRN: [id number]   04/07/2024   The [facility] Quality Team Specialist reviewed this patient medical record for the purposes of chart review for care gap closure. The following were reviewed: chart review for care gap closure-controlling blood pressure.    [facility]BCI Quality Team

## 2024-04-13 ENCOUNTER — Ambulatory Visit: Payer: Medicare PPO

## 2024-06-23 ENCOUNTER — Encounter

## 2024-09-18 ENCOUNTER — Ambulatory Visit

## 2024-09-22 ENCOUNTER — Encounter
